# Patient Record
Sex: Male | Born: 1940 | Race: White | Hispanic: No | Marital: Married | Smoking: Former smoker
Health system: Southern US, Community
[De-identification: ages and names within clinical notes are randomized; demographics above are authoritative.]

## PROBLEM LIST (undated history)

## (undated) DIAGNOSIS — Z86018 Personal history of other benign neoplasm: Secondary | ICD-10-CM

## (undated) DIAGNOSIS — L570 Actinic keratosis: Secondary | ICD-10-CM

## (undated) DIAGNOSIS — R000 Tachycardia, unspecified: Secondary | ICD-10-CM

## (undated) DIAGNOSIS — I472 Ventricular tachycardia: Secondary | ICD-10-CM

## (undated) DIAGNOSIS — E119 Type 2 diabetes mellitus without complications: Secondary | ICD-10-CM

## (undated) DIAGNOSIS — R55 Syncope and collapse: Secondary | ICD-10-CM

## (undated) DIAGNOSIS — I4819 Other persistent atrial fibrillation: Secondary | ICD-10-CM

## (undated) DIAGNOSIS — R001 Bradycardia, unspecified: Secondary | ICD-10-CM

## (undated) DIAGNOSIS — I499 Cardiac arrhythmia, unspecified: Secondary | ICD-10-CM

## (undated) DIAGNOSIS — Z950 Presence of cardiac pacemaker: Secondary | ICD-10-CM

## (undated) HISTORY — DX: Bradycardia, unspecified: R00.1

## (undated) HISTORY — DX: Type 2 diabetes mellitus without complications: E11.9

## (undated) HISTORY — DX: Other persistent atrial fibrillation: I48.19

## (undated) HISTORY — DX: Ventricular tachycardia: I47.2

## (undated) HISTORY — DX: Actinic keratosis: L57.0

## (undated) HISTORY — DX: Tachycardia, unspecified: R00.0

## (undated) HISTORY — PX: FINGER SURGERY: SHX640

---
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
## 2005-07-09 ENCOUNTER — Ambulatory Visit: Payer: Self-pay | Admitting: Unknown Physician Specialty

## 2008-10-17 DIAGNOSIS — Z86018 Personal history of other benign neoplasm: Secondary | ICD-10-CM

## 2008-10-17 HISTORY — DX: Personal history of other benign neoplasm: Z86.018

## 2010-11-14 ENCOUNTER — Inpatient Hospital Stay: Payer: Self-pay | Admitting: Internal Medicine

## 2010-12-24 ENCOUNTER — Ambulatory Visit: Payer: Self-pay | Admitting: Cardiology

## 2011-12-30 IMAGING — CR DG CHEST 1V PORT
1 series · 1 of 1 positions shown · non-contrast
Comparison: none

REASON FOR EXAM: chest pain
COMMENTS:

PROCEDURE:     DXR - DXR PORTABLE CHEST SINGLE VIEW  - November 14, 2010  [DATE]
RESULT:     No acute abnormality.

[view not recorded]
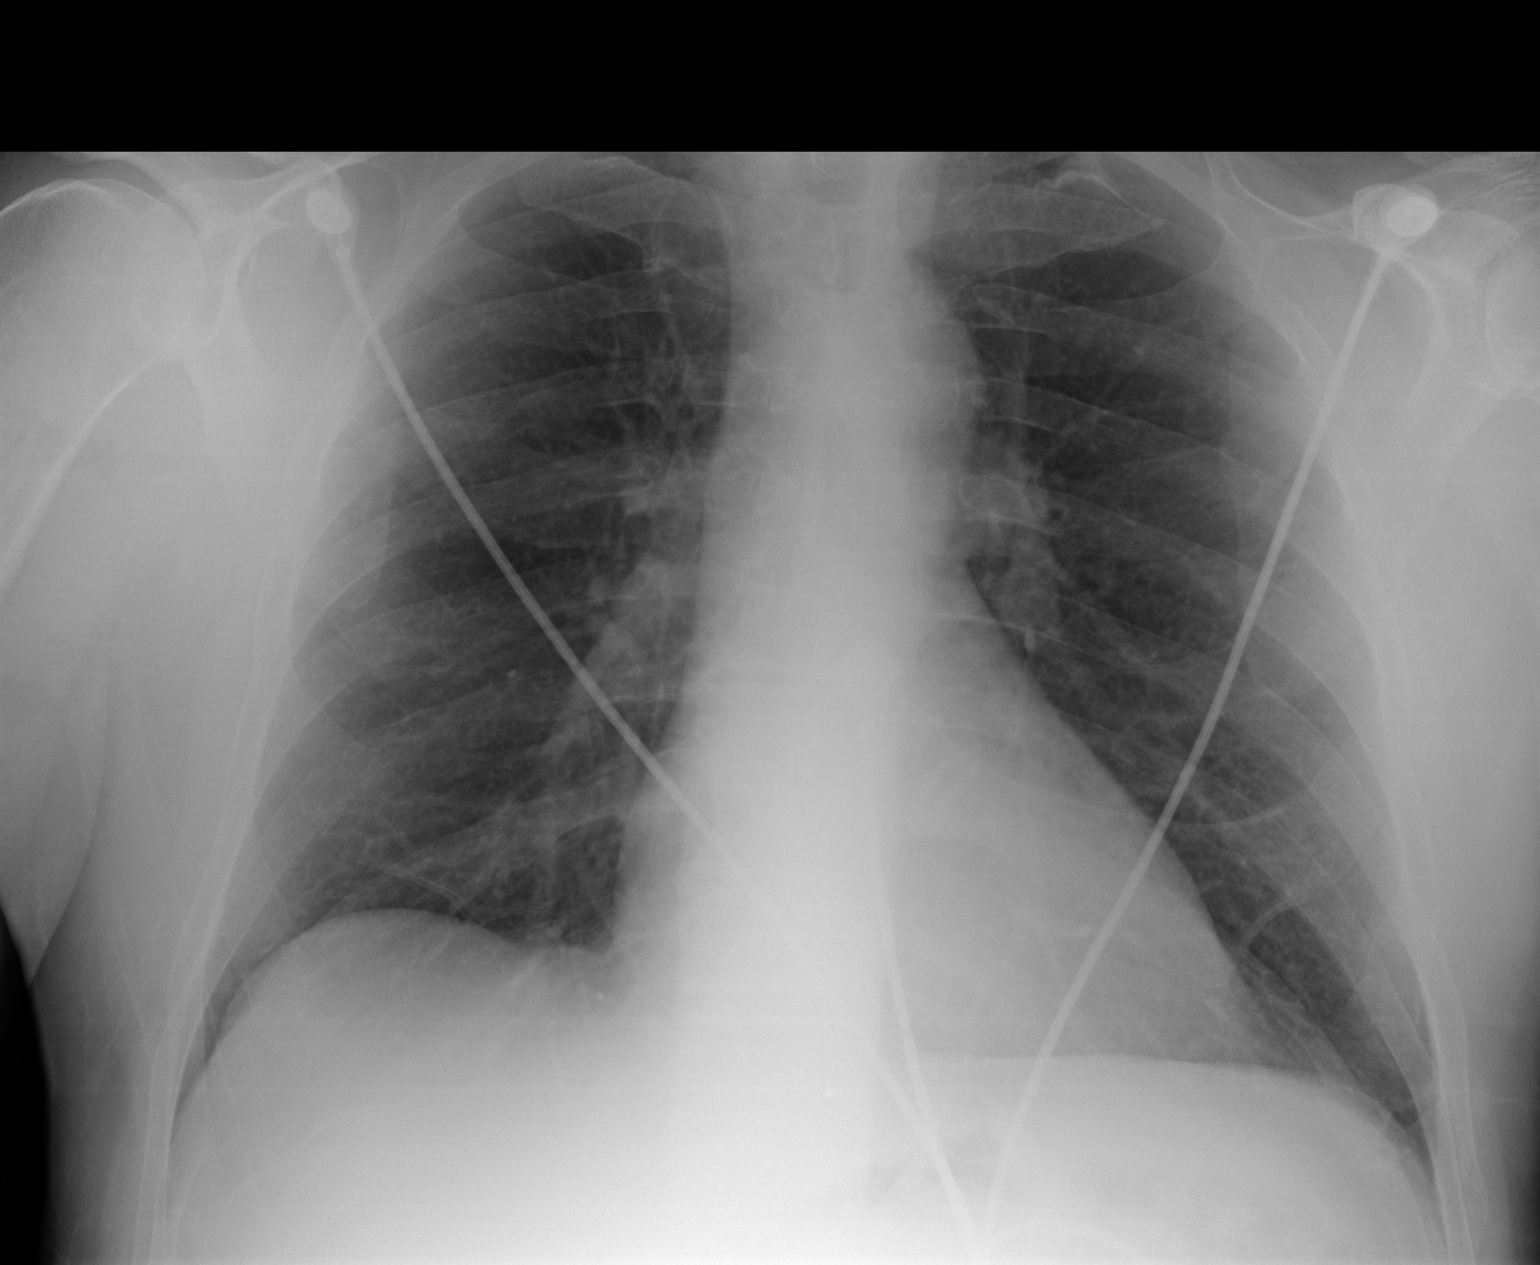

[1 of 1 positions shown; findings below may reference images not displayed]

IMPRESSION: No acute abnormality.

## 2013-09-05 ENCOUNTER — Other Ambulatory Visit: Payer: Self-pay

## 2014-01-04 ENCOUNTER — Encounter (HOSPITAL_COMMUNITY): Payer: Self-pay | Admitting: Emergency Medicine

## 2014-01-04 ENCOUNTER — Inpatient Hospital Stay (HOSPITAL_COMMUNITY)
Admission: EM | Admit: 2014-01-04 | Discharge: 2014-01-09 | DRG: 251 | Disposition: A | Discharge status: Home / Self Care | Payer: Medicare Other | Attending: Cardiology | Admitting: Cardiology

## 2014-01-04 ENCOUNTER — Emergency Department (HOSPITAL_COMMUNITY): Payer: Medicare Other

## 2014-01-04 DIAGNOSIS — I5022 Chronic systolic (congestive) heart failure: Secondary | ICD-10-CM | POA: Diagnosis present

## 2014-01-04 DIAGNOSIS — N179 Acute kidney failure, unspecified: Secondary | ICD-10-CM | POA: Diagnosis present

## 2014-01-04 DIAGNOSIS — I509 Heart failure, unspecified: Secondary | ICD-10-CM | POA: Diagnosis present

## 2014-01-04 DIAGNOSIS — I472 Ventricular tachycardia, unspecified: Secondary | ICD-10-CM | POA: Diagnosis present

## 2014-01-04 DIAGNOSIS — I4729 Other ventricular tachycardia: Secondary | ICD-10-CM | POA: Diagnosis present

## 2014-01-04 DIAGNOSIS — I4891 Unspecified atrial fibrillation: Principal | ICD-10-CM | POA: Diagnosis present

## 2014-01-04 DIAGNOSIS — Z79899 Other long term (current) drug therapy: Secondary | ICD-10-CM

## 2014-01-04 DIAGNOSIS — I428 Other cardiomyopathies: Secondary | ICD-10-CM | POA: Diagnosis present

## 2014-01-04 DIAGNOSIS — Z87891 Personal history of nicotine dependence: Secondary | ICD-10-CM

## 2014-01-04 DIAGNOSIS — Z7982 Long term (current) use of aspirin: Secondary | ICD-10-CM

## 2014-01-04 DIAGNOSIS — I519 Heart disease, unspecified: Secondary | ICD-10-CM

## 2014-01-04 DIAGNOSIS — R000 Tachycardia, unspecified: Secondary | ICD-10-CM

## 2014-01-04 HISTORY — DX: Syncope and collapse: R55

## 2014-01-04 LAB — CBC WITH DIFFERENTIAL/PLATELET
BASOS PCT: 0 % (ref 0–1)
Basophils Absolute: 0 10*3/uL (ref 0.0–0.1)
EOS ABS: 0.1 10*3/uL (ref 0.0–0.7)
Eosinophils Relative: 1 % (ref 0–5)
HEMATOCRIT: 41.3 % (ref 39.0–52.0)
HEMOGLOBIN: 14.4 g/dL (ref 13.0–17.0)
LYMPHS ABS: 1.5 10*3/uL (ref 0.7–4.0)
Lymphocytes Relative: 12 % (ref 12–46)
MCH: 33.6 pg (ref 26.0–34.0)
MCHC: 34.9 g/dL (ref 30.0–36.0)
MCV: 96.5 fL (ref 78.0–100.0)
MONO ABS: 0.7 10*3/uL (ref 0.1–1.0)
Monocytes Relative: 5 % (ref 3–12)
Neutro Abs: 10.5 10*3/uL — ABNORMAL HIGH (ref 1.7–7.7)
Neutrophils Relative %: 82 % — ABNORMAL HIGH (ref 43–77)
Platelets: 202 10*3/uL (ref 150–400)
RBC: 4.28 MIL/uL (ref 4.22–5.81)
RDW: 12.7 % (ref 11.5–15.5)
WBC: 12.9 10*3/uL — ABNORMAL HIGH (ref 4.0–10.5)

## 2014-01-04 LAB — MAGNESIUM: MAGNESIUM: 2.1 mg/dL (ref 1.5–2.5)

## 2014-01-04 LAB — BASIC METABOLIC PANEL
BUN: 29 mg/dL — ABNORMAL HIGH (ref 6–23)
CHLORIDE: 102 meq/L (ref 96–112)
CO2: 22 mEq/L (ref 19–32)
CREATININE: 1.5 mg/dL — AB (ref 0.50–1.35)
Calcium: 8.5 mg/dL (ref 8.4–10.5)
GFR calc non Af Amer: 44 mL/min — ABNORMAL LOW (ref >90)
GFR, EST AFRICAN AMERICAN: 52 mL/min — AB (ref >90)
GLUCOSE: 198 mg/dL — AB (ref 70–99)
Potassium: 4.6 mEq/L (ref 3.7–5.3)
Sodium: 139 mEq/L (ref 137–147)

## 2014-01-04 LAB — POCT I-STAT TROPONIN I: Troponin i, poc: 0.02 ng/mL (ref 0.00–0.08)

## 2014-01-04 LAB — PRO B NATRIURETIC PEPTIDE: Pro B Natriuretic peptide (BNP): 1027 pg/mL — ABNORMAL HIGH (ref 0–125)

## 2014-01-04 MED ORDER — ASPIRIN 81 MG PO CHEW
162.0000 mg | CHEWABLE_TABLET | Freq: Once | ORAL | Status: DC
  Filled 2014-01-04: qty 2

## 2014-01-04 MED ORDER — ASPIRIN EC 81 MG PO TBEC
81.0000 mg | DELAYED_RELEASE_TABLET | Freq: Every day | ORAL | Status: DC
Start: 2014-01-05 — End: 2014-01-09
  Administered 2014-01-05 – 2014-01-09 (×5): 81 mg via ORAL
  Filled 2014-01-04 (×5): qty 1

## 2014-01-04 MED ORDER — SODIUM CHLORIDE 0.9 % IV BOLUS (SEPSIS)
1000.0000 mL | Freq: Once | INTRAVENOUS | Status: AC
  Administered 2014-01-04: 1000 mL via INTRAVENOUS

## 2014-01-04 MED ORDER — HEPARIN SODIUM (PORCINE) 5000 UNIT/ML IJ SOLN
5000.0000 [IU] | Freq: Three times a day (TID) | INTRAMUSCULAR | Status: DC
  Administered 2014-01-05 – 2014-01-08 (×11): 5000 [IU] via SUBCUTANEOUS
  Filled 2014-01-04 (×16): qty 1

## 2014-01-04 MED ORDER — SODIUM CHLORIDE 0.9 % IJ SOLN
3.0000 mL | Freq: Two times a day (BID) | INTRAMUSCULAR | Status: DC
  Administered 2014-01-05 – 2014-01-08 (×6): 3 mL via INTRAVENOUS
  Administered 2014-01-08: 22:00:00 via INTRAVENOUS
  Administered 2014-01-09: 3 mL via INTRAVENOUS

## 2014-01-04 MED ORDER — TIMOLOL MALEATE 0.5 % OP SOLN
1.0000 [drp] | Freq: Every day | OPHTHALMIC | Status: DC
  Administered 2014-01-05 – 2014-01-08 (×4): 1 [drp] via OPHTHALMIC
  Filled 2014-01-04: qty 5

## 2014-01-04 MED ORDER — SODIUM CHLORIDE 0.9 % IV BOLUS (SEPSIS)
500.0000 mL | Freq: Once | INTRAVENOUS | Status: AC
  Administered 2014-01-04: 500 mL via INTRAVENOUS

## 2014-01-04 MED ORDER — SODIUM CHLORIDE 0.9 % IV SOLN
INTRAVENOUS | Status: AC
Start: 2014-01-05 — End: 2014-01-05
  Administered 2014-01-04: 23:00:00 via INTRAVENOUS

## 2014-01-04 NOTE — ED Provider Notes (Signed)
Date: 01/04/2014  Rate: 77  Rhythm: normal sinus rhythm  QRS Axis: left  Intervals: normal  ST/T Wave abnormalities: nonspecific ST changes  Conduction Disutrbances:right bundle branch block  Narrative Interpretation:   Old EKG Reviewed: none available    Sharyon Cable, MD 01/04/14 2030

## 2014-01-04 NOTE — ED Notes (Signed)
Dr Christy Gentles at bedside

## 2014-01-04 NOTE — ED Provider Notes (Signed)
CSN: [id number]     Arrival date & time 01/04/14  1925 History   First MD Initiated Contact with Patient 01/04/14 1934     No chief complaint on file.  HPI Comments: 73 yo M hx of paroxysmal afib, smoking hx, presents via EMS w/ tachycardia s/p cardioversion.  Pt states was bending down to tent to his home garden around 6 PM when he had sudden onset chest tightness (central, nonradiating), palpitations, nausea, diaphoresis, dyspnea.  Pt rested for approximately 15 minutes, with no change in symptoms.  EMS called.  Pt was found to have wide complex tachycardia rate 228, unable to record BP.  Pt received 2 ASA from wife, and EMS gave 150 mg amiodarone, adenosine 6 mg, followed 12 mg, followed by 12 mg, with no change.  Pt started become diaphoretic and nauseated, unable to record BP.  Pt was synchronize cardioverted 100 J, with interval return of sinus rhythm, HR 76, BP 121/44.  On arrival pt states he is asymptomatic.  Family state pt has been increasingly SOB at home for the last 6 months.  He has also had three syncopal episodes in the last three months, last one was 3 weeks ago.  Pt's cardiologist is Dr. Saralyn Pilar in [place], [place].  Pt denies requiring cardioversion in past.  He denies hx of CAD.  He does have hx of paroxysmal afib.  He is currently on Flecainide 50 mg PO BID, and Metoprolol 25 mg PO BID.  No recent changes in medications.   The history is provided by the patient and the EMS personnel. No language interpreter was used.    No past medical history on file. No past surgical history on file. No family history on file. History  Substance Use Topics  . Smoking status: Not on file  . Smokeless tobacco: Not on file  . Alcohol Use: Not on file    Review of Systems  Constitutional: Positive for diaphoresis. Negative for fever and chills.  Respiratory: Positive for chest tightness and shortness of breath. Negative for cough, wheezing and stridor.   Cardiovascular: Positive for chest  pain and palpitations. Negative for leg swelling.  Gastrointestinal: Positive for nausea. Negative for vomiting, abdominal pain and diarrhea.  Musculoskeletal: Negative for myalgias.  Skin: Negative for rash.  Neurological: Negative for dizziness, weakness, light-headedness, numbness and headaches.  Hematological: Negative for adenopathy. Does not bruise/bleed easily.  All other systems reviewed and are negative.      Allergies  Review of patient's allergies indicates not on file.  Home Medications  No current outpatient prescriptions on file. There were no vitals taken for this visit. Physical Exam  Nursing note and vitals reviewed. Constitutional: He is oriented to person, place, and time. He appears well-developed and well-nourished.  HENT:  Head: Normocephalic and atraumatic.  Right Ear: External ear normal.  Left Ear: External ear normal.  Nose: Nose normal.  Mouth/Throat: Oropharynx is clear and moist.  Eyes: Conjunctivae and EOM are normal. Pupils are equal, round, and reactive to light.  Neck: Normal range of motion. Neck supple.  Cardiovascular: Normal rate, regular rhythm, normal heart sounds and intact distal pulses.   NSR, rate 70's.  Trace pitting edema bilaterally.  Pulmonary/Chest: Effort normal and breath sounds normal. No respiratory distress. He has no wheezes. He has no rales. He exhibits no tenderness.  Abdominal: Soft. Bowel sounds are normal. He exhibits no distension and no mass. There is no tenderness. There is no rebound and no guarding.  Musculoskeletal: Normal range  of motion.  Neurological: He is alert and oriented to person, place, and time.  Skin: Skin is warm and dry.    ED Course  Procedures (including critical care time) Labs Review Labs Reviewed  PRO B NATRIURETIC PEPTIDE - Abnormal; Notable for the following:    Pro B Natriuretic peptide (BNP) 1027.0 (*)    All other components within normal limits  CBC WITH DIFFERENTIAL - Abnormal;  Notable for the following:    WBC 12.9 (*)    Neutrophils Relative % 82 (*)    Neutro Abs 10.5 (*)    All other components within normal limits  BASIC METABOLIC PANEL - Abnormal; Notable for the following:    Glucose, Bld 198 (*)    BUN 29 (*)    Creatinine, Ser 1.50 (*)    GFR calc non Af Amer 44 (*)    GFR calc Af Amer 52 (*)    All other components within normal limits  MRSA PCR SCREENING  MAGNESIUM  BASIC METABOLIC PANEL  CBC  PROTIME-INR  POCT I-STAT TROPONIN I   Imaging Review Dg Chest Portable 1 View  01/04/2014   CLINICAL DATA:  Atrial fibrillation.  EXAM: PORTABLE CHEST - 1 VIEW  COMPARISON:  None.  FINDINGS: There is cardiomegaly without edema. The lungs are clear. No pneumothorax or pleural effusion.  IMPRESSION: Cardiomegaly without acute disease.   Electronically Signed   By: Inge Rise M.D.   On: 01/04/2014 20:05    EKG Interpretation   None       MDM   Final diagnoses:  None   74 yo M hx of paroxysmal afib, smoking hx, presents via EMS w/ tachycardia s/p cardioversion.   Filed Vitals:   01/04/14 2212  BP: 82/60  Pulse:   Temp:   Resp: 18   Physical exam as above.  On pt arrival, he is asymptomatic.  Pt placed on cardiac monitor, Zoll pads connected.  Pt with some hypotension to 80's, and started on IVFs for this, with some initial improvement.  Rhythm strips from EMS show wide complex tachycardia concerning for VT vs SVT w/ abberancy.  EKG here s/p cardioversion demonstrates NSR, RBBB, LAFB, LVH, HR 77, PR 197, QRS 140, QT/QTc 417/472.  CXR demonstrated cardiomegaly without acute disease.  CBC with mild leukocytosis 12.5.  BMP with elevated BUN/Cr @ 29/1.5.  Mg 2.1.  Troponin 0.02.  BNP slightly elevated 1027.    Cardiology consulted on ED arrival.  Pt to be admitted to their service for further evaluation and management.  Pt understands and agrees with plan.  Pt's care plan discussed with Dr. Christy Gentles.  Sinda Du, MD      Sinda Du,  MD 01/05/14 [phone]

## 2014-01-04 NOTE — ED Notes (Signed)
Pts family at bedside

## 2014-01-04 NOTE — ED Provider Notes (Signed)
Patient seen/examined in the Emergency Department in conjunction with Resident Physician Provider Justin Mend Patient reports weakness and SOB at home, noted to have [facility] en route and was cardioverted en route.  He now feels well Exam : awake/alert, maex4 Plan: prehospital EKG reveals wide complex tachycardia, now in sinus rhythm Will consult cardiology    Sharyon Cable, MD 01/04/14 2026

## 2014-01-04 NOTE — H&P (Signed)
Cardiology History and Physical  No PCP Per Patient  History of Present Illness (and review of medical records): Grant Moreno. is a 73 y.o. male who presents for evaluation of palpitations.  He is followed by Dr. Saralyn Pilar at [facility] for Atrial fibrillation.  He has no hx of MI or known CAD.  He was admitted in 10/2010 for Afib.  He was placed on Cardizem and converted to sinus rhythm.  TTE at that time revealed LV EF 45%.  He was placed on ASA as CHADS was 0.  He had Sestamibi in follow up on 1/23/12which revealed mild reduce LV function and mild inferior scar without ischemia.  LHC on 12/24/10 revealed EF 56% with normal coronary anatomy.  He has done well on metoprolol and flecainide.    Over the last 6 months he has had several presyncopal and syncopal episodes.  These episodes are similar in nature where he experiences palpitations, dizziness, and abdominal discomfort.  His wife reports at least 3 episodes when he lost consciousness for brief period.  He has not seek attention recently.  Today around 6pm after dinner, he experienced worse episode, also associated with diaphoresis.  EMS was called.  He was found to be in a WCT HR in 200s.  This was unresponsive to Amiodarone and Adenosine 45m x 2.  He became more symptomatic and required DCCV to sinus rhythm.  He was brought to ED for further evaluation.  He currently remains in sinus rhythm and asymptomatic.   Previous diagnostic testing for coronary artery disease includes: cardiac catheterization, echocardiogram and Sestamibi. Previous history of cardiac disease includes Atrial Fibrillation. Coronary artery disease risk factors include: advanced age (older than 554for men, 644for women), male gender, obesity (BMI >= 30 kg/m2) and smoking/ tobacco exposure. Patient denies history of coronary artery disease, ischemic heart disease and previous M.I..  Review of Systems Pertinent items are noted in HPI. Further review of systems was otherwise negative  other than stated in HPI.  Patient Active Problem List   Diagnosis Date Noted  . Wide-complex tachycardia 01/04/2014   Past Medical History  Diagnosis Date  . A-fib   . Syncope   . Bronchitis, chronic     Wife reports that patient gets bronchitis multiple times a year.  . Flu     Documented Flu January 2015 - treated with Tamiflu    Past Surgical History  Procedure Laterality Date  . Finger surgery      Noted the only time patient has been admitted overnight.      Prescriptions prior to admission  Medication Sig Dispense Refill  . aspirin EC 81 MG tablet Take 81 mg by mouth daily.      . cholecalciferol (VITAMIN D) 1000 UNITS tablet Take 1,000 Units by mouth daily.      . flecainide (TAMBOCOR) 50 MG tablet Take 50 mg by mouth 2 (two) times daily.      . metoprolol tartrate (LOPRESSOR) 25 MG tablet Take 12.5 mg by mouth 2 (two) times daily.      . timolol (TIMOPTIC) 0.5 % ophthalmic solution Place 1 drop into the right eye at bedtime.      . vitamin B-12 (CYANOCOBALAMIN) 1000 MCG tablet Take 1,000 mcg by mouth daily.       No Known Allergies  History  Substance Use Topics  . Smoking status: Former Smoker -- 3.00 packs/day    Types: Cigarettes  . Smokeless tobacco: Former [employer]   Types: Chew  .  Alcohol Use: No    History reviewed. No pertinent family history.   Objective:  Patient Vitals for the past 8 hrs:  BP Temp Temp src Pulse Resp SpO2 Height Weight  01/05/14 0406 89/54 mmHg 97.7 F (36.5 C) Oral 67 16 98 % - -  01/05/14 0100 74/42 mmHg - - 67 18 100 % - -  01/05/14 0033 - 97.3 F (36.3 C) Oral - - - - -  01/05/14 0000 74/53 mmHg - - 58 17 96 % - -  01/04/14 2300 83/51 mmHg - - 63 16 98 % - -  01/04/14 2245 101/46 mmHg - - 67 16 97 % _0  (1.93 m) 101.606 kg (224 lb)  01/04/14 2212 82/60 mmHg - - - 18 93 % - -  01/04/14 2150 85/52 mmHg - - - - - - -  01/04/14 2135 81/54 mmHg - - 68 25 98 % - -   General appearance: alert, cooperative, appears stated age and  no distress Head: Normocephalic, without obvious abnormality, atraumatic Eyes: conjunctivae/corneas clear. PERRL, EOM's intact. Fundi benign. Neck: no carotid bruit, no JVD and supple, symmetrical, trachea midline Lungs: clear to auscultation bilaterally Chest wall: no tenderness Heart: regular rate and rhythm, S1, S2 normal, no murmur Abdomen: soft, non-tender; bowel sounds normal; no masses,  no organomegaly Extremities: extremities normal, atraumatic, no cyanosis or edema Pulses: 2+ and symmetric Neurologic: Grossly normal  Results for orders placed during the hospital encounter of 01/04/14 (from the past 48 hour(s))  PRO B NATRIURETIC PEPTIDE     Status: Abnormal   Collection Time    01/04/14  7:48 PM      Result Value Ref Range   Pro B Natriuretic peptide (BNP) 1027.0 (*) 0 - 125 pg/mL  CBC WITH DIFFERENTIAL     Status: Abnormal   Collection Time    01/04/14  7:48 PM      Result Value Ref Range   WBC 12.9 (*) 4.0 - 10.5 K/uL   RBC 4.28  4.22 - 5.81 MIL/uL   Hemoglobin 14.4  13.0 - 17.0 g/dL   HCT 41.3  39.0 - 52.0 %   MCV 96.5  78.0 - 100.0 fL   MCH 33.6  26.0 - 34.0 pg   MCHC 34.9  30.0 - 36.0 g/dL   RDW 12.7  11.5 - 15.5 %   Platelets 202  150 - 400 K/uL   Neutrophils Relative % 82 (*) 43 - 77 %   Neutro Abs 10.5 (*) 1.7 - 7.7 K/uL   Lymphocytes Relative 12  12 - 46 %   Lymphs Abs 1.5  0.7 - 4.0 K/uL   Monocytes Relative 5  3 - 12 %   Monocytes Absolute 0.7  0.1 - 1.0 K/uL   Eosinophils Relative 1  0 - 5 %   Eosinophils Absolute 0.1  0.0 - 0.7 K/uL   Basophils Relative 0  0 - 1 %   Basophils Absolute 0.0  0.0 - 0.1 K/uL  BASIC METABOLIC PANEL     Status: Abnormal   Collection Time    01/04/14  7:48 PM      Result Value Ref Range   Sodium 139  137 - 147 mEq/L   Potassium 4.6  3.7 - 5.3 mEq/L   Chloride 102  96 - 112 mEq/L   CO2 22  19 - 32 mEq/L   Glucose, Bld 198 (*) 70 - 99 mg/dL   BUN 29 (*) 6 - 23 mg/dL  Creatinine, Ser 1.50 (*) 0.50 - 1.35 mg/dL    Calcium 8.5  8.4 - 10.5 mg/dL   GFR calc non Af Amer 44 (*) >90 mL/min   GFR calc Af Amer 52 (*) >90 mL/min   Comment: (NOTE)     The eGFR has been calculated using the CKD EPI equation.     This calculation has not been validated in all clinical situations.     eGFR's persistently <90 mL/min signify possible Chronic Kidney     Disease.  MAGNESIUM     Status: None   Collection Time    01/04/14  7:48 PM      Result Value Ref Range   Magnesium 2.1  1.5 - 2.5 mg/dL  POCT I-STAT TROPONIN I     Status: None   Collection Time    01/04/14  7:57 PM      Result Value Ref Range   Troponin i, poc 0.02  0.00 - 0.08 ng/mL   Comment 3            Comment: Due to the release kinetics of cTnI,     a negative result within the first hours     of the onset of symptoms does not rule out     myocardial infarction with certainty.     If myocardial infarction is still suspected,     repeat the test at appropriate intervals.  MRSA PCR SCREENING     Status: None   Collection Time    01/04/14 10:57 PM      Result Value Ref Range   MRSA by PCR NEGATIVE  NEGATIVE   Comment:            The GeneXpert MRSA Assay (FDA     approved for NASAL specimens     only), is one component of a     comprehensive MRSA colonization     surveillance program. It is not     intended to diagnose MRSA     infection nor to guide or     monitor treatment for     MRSA infections.  BASIC METABOLIC PANEL     Status: Abnormal   Collection Time    01/05/14  2:53 AM      Result Value Ref Range   Sodium 141  137 - 147 mEq/L   Potassium 4.8  3.7 - 5.3 mEq/L   Chloride 109  96 - 112 mEq/L   CO2 22  19 - 32 mEq/L   Glucose, Bld 96  70 - 99 mg/dL   BUN 28 (*) 6 - 23 mg/dL   Creatinine, Ser 1.20  0.50 - 1.35 mg/dL   Calcium 7.8 (*) 8.4 - 10.5 mg/dL   GFR calc non Af Amer 58 (*) >90 mL/min   GFR calc Af Amer 67 (*) >90 mL/min   Comment: (NOTE)     The eGFR has been calculated using the CKD EPI equation.     This calculation  has not been validated in all clinical situations.     eGFR's persistently <90 mL/min signify possible Chronic Kidney     Disease.  CBC     Status: Abnormal   Collection Time    01/05/14  2:53 AM      Result Value Ref Range   WBC 8.8  4.0 - 10.5 K/uL   RBC 3.98 (*) 4.22 - 5.81 MIL/uL   Hemoglobin 13.2  13.0 - 17.0 g/dL   HCT 38.5 (*) 39.0 - 52.0 %  MCV 96.7  78.0 - 100.0 fL   MCH 33.2  26.0 - 34.0 pg   MCHC 34.3  30.0 - 36.0 g/dL   RDW 12.8  11.5 - 15.5 %   Platelets 181  150 - 400 K/uL  PROTIME-INR     Status: Abnormal   Collection Time    01/05/14  2:53 AM      Result Value Ref Range   Prothrombin Time 15.9 (*) 11.6 - 15.2 seconds   INR 1.30  0.00 - 1.49   Dg Chest Portable 1 View  01/04/2014   CLINICAL DATA:  Atrial fibrillation.  EXAM: PORTABLE CHEST - 1 VIEW  COMPARISON:  None.  FINDINGS: There is cardiomegaly without edema. The lungs are clear. No pneumothorax or pleural effusion.  IMPRESSION: Cardiomegaly without acute disease.   Electronically Signed   By: Inge Rise M.D.   On: 01/04/2014 20:05    ECG:  Rhythm strip reveals Wide complex tachycardia HR 228.  EKG reveals sinus rhythm HR 77 RBBB, LAFB, which is similar to prior ecgs at [facility].  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 Has had recent evaluation for ischemic disease with normal cath in 2012 at [facility]. -Hx of PAF on Flecainide and Metoprolol -AKI  Plan: 1. Cardiology Admission  2. Continuous monitoring on Telemetry. 3. Repeat ekg on admit, prn chest pain or arrythmia 4. Continue BB, as BP will tolerate. 5. EP evaluation in am 6. Gentle IVFs, monitor electrolytes

## 2014-01-04 NOTE — ED Notes (Signed)
Dr Justin Mend in room

## 2014-01-04 NOTE — ED Notes (Signed)
Pt states that his BP has been running low since he was started on metoprolol. States that his MD has cut his dosage in half.

## 2014-01-04 NOTE — ED Notes (Addendum)
Pt arrives via EMS. Pts family called EMS after pt ambulating to mailbox and experienced shortness of breath and stomach fullness. When EMS arrives pt appeared afibv with PVC's on the monitor rate 220. No BP detectable. Pt AAOx3. EMS administered 15 amiodarone, Adenosine 6, 12 ,12. Pt became nauseous and SOB and EMS administered Versed 2.5. Pt then cardioverted @ 100 and became SR w/ ocasional PVC's rate 72. BP 121/44. Pt states she had a similar episode of this 3 years ago. States that he does experience syncope but has not contacted a PCP about these symptoms. Wife gave pt 2 350's aspirins before EMS arrives.

## 2014-01-04 NOTE — ED Notes (Signed)
Dr. Webb at bedside

## 2014-01-04 NOTE — ED Notes (Signed)
Dr Waunita Schooner in room

## 2014-01-05 ENCOUNTER — Encounter (HOSPITAL_COMMUNITY): Payer: Self-pay | Admitting: Surgery

## 2014-01-05 DIAGNOSIS — I519 Heart disease, unspecified: Secondary | ICD-10-CM

## 2014-01-05 DIAGNOSIS — I4891 Unspecified atrial fibrillation: Principal | ICD-10-CM

## 2014-01-05 DIAGNOSIS — I369 Nonrheumatic tricuspid valve disorder, unspecified: Secondary | ICD-10-CM

## 2014-01-05 DIAGNOSIS — I4729 Other ventricular tachycardia: Secondary | ICD-10-CM

## 2014-01-05 DIAGNOSIS — I472 Ventricular tachycardia: Secondary | ICD-10-CM

## 2014-01-05 LAB — BASIC METABOLIC PANEL
BUN: 28 mg/dL — AB (ref 6–23)
CHLORIDE: 109 meq/L (ref 96–112)
CO2: 22 mEq/L (ref 19–32)
CREATININE: 1.2 mg/dL (ref 0.50–1.35)
Calcium: 7.8 mg/dL — ABNORMAL LOW (ref 8.4–10.5)
GFR calc Af Amer: 67 mL/min — ABNORMAL LOW (ref >90)
GFR calc non Af Amer: 58 mL/min — ABNORMAL LOW (ref >90)
Glucose, Bld: 96 mg/dL (ref 70–99)
Potassium: 4.8 mEq/L (ref 3.7–5.3)
Sodium: 141 mEq/L (ref 137–147)

## 2014-01-05 LAB — CBC
HEMATOCRIT: 38.5 % — AB (ref 39.0–52.0)
Hemoglobin: 13.2 g/dL (ref 13.0–17.0)
MCH: 33.2 pg (ref 26.0–34.0)
MCHC: 34.3 g/dL (ref 30.0–36.0)
MCV: 96.7 fL (ref 78.0–100.0)
Platelets: 181 10*3/uL (ref 150–400)
RBC: 3.98 MIL/uL — ABNORMAL LOW (ref 4.22–5.81)
RDW: 12.8 % (ref 11.5–15.5)
WBC: 8.8 10*3/uL (ref 4.0–10.5)

## 2014-01-05 LAB — PROTIME-INR
INR: 1.3 (ref 0.00–1.49)
Prothrombin Time: 15.9 seconds — ABNORMAL HIGH (ref 11.6–15.2)

## 2014-01-05 LAB — GLUCOSE, CAPILLARY: GLUCOSE-CAPILLARY: 101 mg/dL — AB (ref 70–99)

## 2014-01-05 LAB — MRSA PCR SCREENING: MRSA by PCR: NEGATIVE

## 2014-01-05 MED ORDER — CARVEDILOL 3.125 MG PO TABS
3.1250 mg | ORAL_TABLET | Freq: Two times a day (BID) | ORAL | Status: DC
  Administered 2014-01-06 – 2014-01-09 (×8): 3.125 mg via ORAL
  Filled 2014-01-05 (×11): qty 1

## 2014-01-05 MED ORDER — SODIUM CHLORIDE 0.9 % IV BOLUS (SEPSIS)
500.0000 mL | Freq: Once | INTRAVENOUS | Status: AC
  Administered 2014-01-05: 500 mL via INTRAVENOUS

## 2014-01-05 MED ORDER — LISINOPRIL 5 MG PO TABS
5.0000 mg | ORAL_TABLET | Freq: Every day | ORAL | Status: DC
  Administered 2014-01-06 – 2014-01-09 (×4): 5 mg via ORAL
  Filled 2014-01-05 (×4): qty 1

## 2014-01-05 NOTE — Consult Note (Addendum)
ELECTROPHYSIOLOGY CONSULT NOTE   Patient ID: Grant Moreno. MRN: [id number], DOB/AGE: 03/08/1941   Admit date: 01/04/2014 Date of Consult: 01/05/2014  Primary Physician: None per patient Primary Cardiologist: Saralyn Pilar, MD in [place], [place] Reason for Consultation: Syncope and WCT  History of Present Illness Grant Moreno. is a 73 y.o. male with paroxysmal atrial fibrillation and atrial flutter diagnosed 2011, chronic LBBB, normal coronary arteries  LVEF (45%) s/p cardiac cath Feb 2012 who was admitted last night with near syncope. He has been followed by Dr. Saralyn Moreno for atrial fibrillation and is currently taking flecainide and metoprolol. Over the last 3 months Grant Moreno reports intermittent dizziness / near syncope and 3 episodes of syncope. All of the near syncopal episodes are similar in that he has sudden onset of racing palpitations accompanied by dizziness and abdominal fullness. His syncopal episodes have been ongoing for years and seem to be more related to postural changes and Mrs. Rote states his BP always runs low. Yesterday around 6 pm after eating dinner he experienced his worst episode which was more severe than previous and was accompanied by profound diaphoresis. EMS was activated and on their arrival, he was found to be in a WCT with rate of 228 bpm. Per EMS report, he was given amiodarone and adenosine without improvement or change in rhythm. He became more symptomatic and was cardioverted in the field. On admission he was in [place]. His work-up so far is notable for negative troponin, elevated BNP 1027, normal potassium 4.6, normal magnesium 2.1. Echo and thyroid studies are pending. He reports compliance with his medications.     Past Medical History Past Medical History  Diagnosis Date  . A-fib   . Syncope   . Bronchitis, chronic     Wife reports that patient gets bronchitis multiple times a year.  . Flu     Documented Flu January 2015 - treated with Tamiflu    Past Surgical  History Past Surgical History  Procedure Laterality Date  . Finger surgery      Noted the only time patient has been admitted overnight.      Allergies/Intolerances No Known Allergies  Current Home Medications      aspirin EC 81 MG tablet  Take 81 mg by mouth daily.     cholecalciferol 1000 UNITS tablet  Commonly known as:  VITAMIN D  Take 1,000 Units by mouth daily.     flecainide 50 MG tablet  Commonly known as:  TAMBOCOR  Take 50 mg by mouth 2 (two) times daily.     metoprolol tartrate 25 MG tablet  Commonly known as:  LOPRESSOR  Take 12.5 mg by mouth 2 (two) times daily.     timolol 0.5 % ophthalmic solution  Commonly known as:  TIMOPTIC  Place 1 drop into the right eye at bedtime.     vitamin B-12 1000 MCG tablet  Commonly known as:  CYANOCOBALAMIN  Take 1,000 mcg by mouth daily.     Inpatient Medications . aspirin EC  81 mg Oral Daily  . heparin  5,000 Units Subcutaneous 3 times per day  . sodium chloride  3 mL Intravenous Q12H  . timolol  1 drop Right Eye QHS    Family History Positive for CAD in his father but not prematurely. He ultimately died from lung CA. Negative family history for sudden cardiac death.  Social History History   Social History  . Marital Status: Married    Spouse Name: N/A  Number of Children: N/A  . Years of Education: N/A   Occupational History  . Not on file.   Social History Main Topics  . Smoking status: Former Smoker -- 3.00 packs/day    Types: Cigarettes  . Smokeless tobacco: Former Systems developer    Types: Chew  . Alcohol Use: No  . Drug Use: No  . Sexual Activity: Not on file   Other Topics Concern  . Not on file   Social History Narrative  . No narrative on file     Review of Systems General: No chills, fever, night sweats or weight changes  Cardiovascular:  No chest pain, dyspnea on exertion, edema, orthopnea, palpitations, paroxysmal nocturnal dyspnea Dermatological: No rash, lesions or masses Respiratory:  No cough, dyspnea Urologic: No hematuria, dysuria Abdominal: No nausea, vomiting, diarrhea, bright red blood per rectum, melena, or hematemesis Neurologic: No visual changes, weakness, changes in mental status All other systems reviewed and are otherwise negative except as noted above.  Physical Exam Vitals: Blood pressure 102/66, pulse 57, temperature 97.8 F (36.6 C), temperature source Oral, resp. rate 17, height 6\' 4"  (1.93 m), weight 224 lb (101.606 kg), SpO2 97.00%.  General: Well developed, well appearing 73 y.o. male in no acute distress. HEENT: Normocephalic, atraumatic. EOMs intact. Sclera nonicteric. Oropharynx clear.  Neck: Supple without bruits. No JVD. Lungs: Respirations regular and unlabored, CTA bilaterally. No wheezes, rales or rhonchi. Heart: RRR. S1, S2 present. No murmurs, rub, S3 or S4. Abdomen: Soft, non-tender, non-distended. BS present x 4 quadrants. No hepatosplenomegaly.  Extremities: No clubbing, cyanosis or edema. DP/PT/Radials 2+ and equal bilaterally. Psych: Normal affect. Neuro: Alert and oriented X 3. Moves all extremities spontaneously. Musculoskeletal: No kyphosis. Skin: Intact. Warm and dry. No rashes or petechiae in exposed areas.   Labs Lab Results  Component Value Date   WBC 8.8 01/05/2014   HGB 13.2 01/05/2014   HCT 38.5* 01/05/2014   MCV 96.7 01/05/2014   PLT 181 01/05/2014    Recent Labs Lab 01/05/14 0253  NA 141  K 4.8  CL 109  CO2 22  BUN 28*  CREATININE 1.20  CALCIUM 7.8*  GLUCOSE 96   Magnesium 2.1 Troponin 0.02 ProBNP 1027   Recent Labs  01/05/14 0253  INR 1.30    Radiology/Studies Dg Chest Portable 1 View January 17, 2014    FINDINGS: There is cardiomegaly without edema. The lungs are clear. No pneumothorax or pleural effusion.   IMPRESSION: Cardiomegaly without acute disease.    Electronically Signed   By: Inge Rise M.D.   On: 01/17/2014 20:05   Echocardiogram  Pending  12-lead ECG from EMS - regular, WCT with  RBBB morphology at 228 bpm 12-lead ECG this AM - NSR at 62 bpm with LBBB, QRS duration 124 msec Telemetry - SR with PACs and PVCs  Assessment and Plan  1. Sustained VT - question if related to flecainide proarrhythmic effects - stop flecainide  - agree with cycling cardiac enzymes to rule out MI - will order an echocardiogram to assess LVEF - will get all records from Dr. Saralyn Moreno  - no driving   2. PAF  - on flecainide prior to admission - recommend discontinuing flecainide and will check level - if LVEF normal, chads2-vasc score is 1; not on anticoagulation currently - continue metoprolol  Signed, EDMISTEN, BROOKE, PA-C 01/05/2014, 11:36 AM  I have seen, examined the patient, and reviewed the above assessment and plan.  Changes to above are made where necessary.  The patient presents with  syncope and documented wide complex tachycardia.  I have reviewed his EMS strips.  Though I cannot exclude aberrant SVT, I think that the tracings are more consistent with VT.  This could be an arrhythmic effect of flecainide, however he does have a significantly depressed EF.  Flecainide level is pending.  I would recommend cath to evaluate his worsening EF.  In addition, I would advise initiation of ace inhibitor and beta blocker therapy.  If he has no lesions amenable to intervention, then I would advise EP study on Monday.  If he continues to have inducible VT after flecainide washout then I think that I would favor ICD implantation at that time. (an alternative would be amiodarone therapy).  Given his age and EF, CHADS2VASC score is at least 2, he will require anticoagulation long term for his afib.  I would defer this until the above workup is complete.  He will also likely need an AAD for management of his afib.  His options would be either tikosyn or amiodarone.  I would defer this at that present time as well.  Keep K >3.9 and Mg>1.9.  Check TFTs.  EP to follow.  Co Sign: Thompson Grayer,  MD 01/05/2014 10:44 PM

## 2014-01-05 NOTE — Progress Notes (Signed)
Utilization Review Completed.  

## 2014-01-05 NOTE — Progress Notes (Signed)
  Echocardiogram 2D Echocardiogram has been performed.  Walton Digilio 01/05/2014, 3:30 PM

## 2014-01-05 NOTE — Progress Notes (Signed)
   TELEMETRY: Reviewed telemetry pt in NSR: Filed Vitals:   01/05/14 0500 01/05/14 0600 01/05/14 0700 01/05/14 0746  BP: 91/60 81/38 102/66 102/66  Pulse: 55 67 63 57  Temp:    97.8 F (36.6 C)  TempSrc:    Oral  Resp: 14 18 16 17   Height:      Weight:      SpO2: 97% 99% 96% 97%    Intake/Output Summary (Last 24 hours) at 01/05/14 1130 Last data filed at 01/05/14 0800  Gross per 24 hour  Intake   3000 ml  Output    700 ml  Net   2300 ml    SUBJECTIVE No complaints today. Denies CP or SOB  LABS: Basic Metabolic Panel:  Recent Labs  01/04/14 1948 01/05/14 0253  NA 139 141  K 4.6 4.8  CL 102 109  CO2 22 22  GLUCOSE 198* 96  BUN 29* 28*  CREATININE 1.50* 1.20  CALCIUM 8.5 7.8*  MG 2.1  --    Liver Function Tests: No results found for this basename: AST, ALT, ALKPHOS, BILITOT, PROT, ALBUMIN,  in the last 72 hours No results found for this basename: LIPASE, AMYLASE,  in the last 72 hours CBC:  Recent Labs  01/04/14 1948 01/05/14 0253  WBC 12.9* 8.8  NEUTROABS 10.5*  --   HGB 14.4 13.2  HCT 41.3 38.5*  MCV 96.5 96.7  PLT 202 181   Cardiac Enzymes: No results found for this basename: CKTOTAL, CKMB, CKMBINDEX, TROPONINI,  in the last 72 hours BNP: 1027   Radiology/Studies:  Dg Chest Portable 1 View  01/04/2014   CLINICAL DATA:  Atrial fibrillation.  EXAM: PORTABLE CHEST - 1 VIEW  COMPARISON:  None.  FINDINGS: There is cardiomegaly without edema. The lungs are clear. No pneumothorax or pleural effusion.  IMPRESSION: Cardiomegaly without acute disease.   Electronically Signed   By: Inge Rise M.D.   On: 01/04/2014 20:05   Ecg: NSR LAD, LBBB  PHYSICAL EXAM General: Well developed, well nourished, in no acute distress. Head: Normocephalic, atraumatic, sclera non-icteric, no xanthomas, nares are without discharge. Neck: Negative for carotid bruits. JVD not elevated. Lungs: Clear bilaterally to auscultation without wheezes, rales, or rhonchi.  Breathing is unlabored. Heart: RRR S1 S2 without murmurs, rubs, or gallops.  Abdomen: Soft, non-tender, non-distended with normoactive bowel sounds. No hepatomegaly. No rebound/guarding. No obvious abdominal masses. Msk:  Strength and tone appears normal for age. Extremities: No clubbing, cyanosis or edema.  Distal pedal pulses are 2+ and equal bilaterally. Neuro: Alert and oriented X 3. Moves all extremities spontaneously. Psych:  Responds to questions appropriately with a normal affect.  ASSESSMENT AND PLAN: 1. Wide complex tachycardia. Rate 200. S/p cardioversion. Doubt this is afib. ? Flutter with 1:1 conduction versus V tach. Will hold flecainide. Check Echo. Will ask EP to see today. Patient reports several episodes of syncope/presyncope over the past 3 months. Also complains of increased dyspnea on exertion.  2. History of atrial fibrillation. 3. AKI- improved.  Active Problems:   Wide-complex tachycardia    Signed, Keona Sheffler Martinique MD,FACC 01/05/2014 11:34 AM

## 2014-01-05 NOTE — Progress Notes (Signed)
Nursing Note: Patient asymptomatic hypotension (right arm 74/51) (left arm 68/43).  Notified paging system to advise MD on call for additional orders or new reference parameters.

## 2014-01-06 ENCOUNTER — Encounter (HOSPITAL_COMMUNITY)
Admission: EM | Disposition: A | Discharge status: Home / Self Care | Payer: Self-pay | Source: Home / Self Care | Attending: Cardiology

## 2014-01-06 HISTORY — PX: LEFT HEART CATHETERIZATION WITH CORONARY ANGIOGRAM: SHX5451

## 2014-01-06 LAB — BASIC METABOLIC PANEL
BUN: 15 mg/dL (ref 6–23)
CO2: 23 mEq/L (ref 19–32)
Calcium: 8.6 mg/dL (ref 8.4–10.5)
Chloride: 102 mEq/L (ref 96–112)
Creatinine, Ser: 0.89 mg/dL (ref 0.50–1.35)
GFR calc Af Amer: >90 mL/min (ref >90)
GFR calc non Af Amer: 83 mL/min — ABNORMAL LOW (ref >90)
Glucose, Bld: 97 mg/dL (ref 70–99)
POTASSIUM: 4 meq/L (ref 3.7–5.3)
SODIUM: 139 meq/L (ref 137–147)

## 2014-01-06 LAB — TSH: TSH: 1.623 u[IU]/mL (ref 0.350–4.500)

## 2014-01-06 SURGERY — LEFT HEART CATHETERIZATION WITH CORONARY ANGIOGRAM
Anesthesia: LOCAL

## 2014-01-06 MED ORDER — LIDOCAINE HCL (PF) 1 % IJ SOLN
INTRAMUSCULAR | Status: AC
  Filled 2014-01-06: qty 30

## 2014-01-06 MED ORDER — SODIUM CHLORIDE 0.9 % IV SOLN
1.0000 mL/kg/h | INTRAVENOUS | Status: DC
  Administered 2014-01-06: 1 mL/kg/h via INTRAVENOUS

## 2014-01-06 MED ORDER — SODIUM CHLORIDE 0.9 % IV SOLN: 1.0000 mL/kg/h | INTRAVENOUS | Status: AC

## 2014-01-06 MED ORDER — NITROGLYCERIN 0.2 MG/ML ON CALL CATH LAB
INTRAVENOUS | Status: AC
  Filled 2014-01-06: qty 1

## 2014-01-06 MED ORDER — SODIUM CHLORIDE 0.9 % IV SOLN: 250.0000 mL | INTRAVENOUS | Status: DC | PRN

## 2014-01-06 MED ORDER — MIDAZOLAM HCL 2 MG/2ML IJ SOLN
INTRAMUSCULAR | Status: AC
  Filled 2014-01-06: qty 2

## 2014-01-06 MED ORDER — SODIUM CHLORIDE 0.9 % IJ SOLN: 3.0000 mL | INTRAMUSCULAR | Status: DC | PRN

## 2014-01-06 MED ORDER — FENTANYL CITRATE 0.05 MG/ML IJ SOLN
INTRAMUSCULAR | Status: AC
  Filled 2014-01-06: qty 2

## 2014-01-06 MED ORDER — VERAPAMIL HCL 2.5 MG/ML IV SOLN
INTRAVENOUS | Status: AC
  Filled 2014-01-06: qty 2

## 2014-01-06 MED ORDER — SODIUM CHLORIDE 0.9 % IJ SOLN: 3.0000 mL | Freq: Two times a day (BID) | INTRAMUSCULAR | Status: DC

## 2014-01-06 MED ORDER — HEPARIN (PORCINE) IN NACL 2-0.9 UNIT/ML-% IJ SOLN
INTRAMUSCULAR | Status: AC
  Filled 2014-01-06: qty 1000

## 2014-01-06 NOTE — H&P (View-Only) (Signed)
 SUBJECTIVE: The patient is doing well today. He reports that he has been having exertional shortness of breath recently. At this time, he denies chest pain or any new concerns.  Echo yesterday demonstrated EF 35-40%, global hypokinesis (cath 12/2010 at [facility] demonstrated normal coronaries, EF 56%)  CURRENT MEDICATIONS: . aspirin EC  81 mg Oral Daily  . carvedilol  3.125 mg Oral BID WC  . heparin  5,000 Units Subcutaneous 3 times per day  . lisinopril  5 mg Oral Daily  . sodium chloride  3 mL Intravenous Q12H  . timolol  1 drop Right Eye QHS      OBJECTIVE: Physical Exam: Filed Vitals:   01/05/14 1223 01/05/14 1602 01/05/14 2000 01/05/14 2315  BP: 134/80 116/60 129/65 136/71  Pulse: 63 58 62 67  Temp: 98.4 F (36.9 C) 98.1 F (36.7 C) 98 F (36.7 C) 97.2 F (36.2 C)  TempSrc: Oral Oral Oral Oral  Resp: 22 17 15 17  Height:      Weight:      SpO2: 99% 99% 97% 96%    Intake/Output Summary (Last 24 hours) at 01/06/14 0653 Last data filed at 01/06/14 0600  Gross per 24 hour  Intake    705 ml  Output    300 ml  Net    405 ml    Telemetry reveals sinus rhythm, no further arrhythmias  GEN- The patient is well appearing, alert and oriented x 3 today.   Head- normocephalic, atraumatic Eyes-  Sclera clear, conjunctiva pink Ears- hearing intact Oropharynx- clear Neck- supple, no JVP Lymph- no cervical lymphadenopathy Lungs- Clear to ausculation bilaterally, normal work of breathing Heart- Regular rate and rhythm, no murmurs, rubs or gallops, PMI not laterally displaced GI- soft, NT, ND, + BS Extremities- no clubbing, cyanosis, or edema Neuro- strength and sensation are intact  LABS: Basic Metabolic Panel:  Recent Labs  01/04/14 1948 01/05/14 0253  NA 139 141  K 4.6 4.8  CL 102 109  CO2 22 22  GLUCOSE 198* 96  BUN 29* 28*  CREATININE 1.50* 1.20  CALCIUM 8.5 7.8*  MG 2.1  --    CBC:  Recent Labs  01/04/14 1948 01/05/14 0253  WBC 12.9* 8.8  NEUTROABS  10.5*  --   HGB 14.4 13.2  HCT 41.3 38.5*  MCV 96.5 96.7  PLT 202 181   Thyroid Function Tests:  Recent Labs  01/05/14 1425  TSH 1.623    RADIOLOGY: Dg Chest Portable 1 View 01/04/2014   CLINICAL DATA:  Atrial fibrillation.  EXAM: PORTABLE CHEST - 1 VIEW  COMPARISON:  None.  FINDINGS: There is cardiomegaly without edema. The lungs are clear. No pneumothorax or pleural effusion.  IMPRESSION: Cardiomegaly without acute disease.   Electronically Signed   By: Thomas  Dalessio M.D.   On: 01/04/2014 20:05    ASSESSMENT AND PLAN:  Principal Problem:   Wide-complex tachycardia Active Problems:   Atrial fibrillation  1.  Chronic systolic dysfunction EF has declined further to 35-40%.  Given exertional SOB and reduced EF in the setting of VT, I would advise cath for CAD assessment (cath 2012).  Risks, benefits,and alternatives to left heart cath with possible PCI were discussed with the patient who wishes to proceed. Add lisinopril and coreg  2. VT/syncope I would recommend washout of flecainide over the weekend.  If cath is unrevealing, will proceed with EP study on Monday to determine need for an ICD.  IF no inducible VT, amiodarone for treatment of   afib would be a reasonable alternative.  If cath reveals CAD and intervention is planned then I would hold off on EP study. Keep in hospital over the weekend.  No driving x 6 months  3. afib maintaining sinus Will require anticoagulation given chads2vasc score of 2 long term.  I would wait until all EP workup is complete to begin anticoagulation unless he goes into afib/ flutter over the weekend.  Could transfer to telemetry post cath Keep in hospital over the weekend Dr Lovena Le to round on patient over the weekend.

## 2014-01-06 NOTE — Interval H&P Note (Signed)
History and Physical Interval Note:  01/06/2014 1:49 PM  Grant Moreno.  has presented today for surgery, with the diagnosis of Wide Complex Tachycardia with Syncope.  The various methods of treatment have been discussed with the patient and family. After consideration of risks, benefits and other options for treatment, the patient has consented to  Procedure(s): LEFT HEART CATHETERIZATION WITH CORONARY ANGIOGRAM (N/A) & Possible PCI as a surgical intervention .  The patient's history has been reviewed, patient examined, no change in status, stable for surgery.  I have reviewed the patient's chart and labs.  Questions were answered to the patient's satisfaction.     Grant Moreno W  Cath Lab Visit (complete for each Cath Lab visit)  Clinical Evaluation Leading to the Procedure:   ACS: no  Non-ACS:    Anginal Classification: CCS II  Anti-ischemic medical therapy: Maximal Therapy (2 or more classes of medications)  Non-Invasive Test Results: Equivocal test results  Prior CABG: No previous CABG

## 2014-01-06 NOTE — ED Provider Notes (Signed)
I have personally seen and examined the patient and discussed plan of care with the resident.  I was present for entire procedure.  I have reviewed the appropriate documentation on PMH/FH/Soc. History.  I have reviewed the documentation of the resident and agree.     Sharyon Cable, MD 01/06/14 [id number]

## 2014-01-06 NOTE — CV Procedure (Signed)
CARDIAC CATHETERIZATION REPORT  NAME:  Grant Moreno.   MRN: [id number] DOB:  10-31-1941   ADMIT DATE: 01/04/2014 Procedure Date: 01/06/2014  INTERVENTIONAL CARDIOLOGIST: Leonie Man, M.D., MS PRIMARY CARE PROVIDER: No PCP Per Patient PRIMARY CARDIOLOGIST: Dr. Saralyn Pilar at [facility]    New to [facility]; referred by Drs. Peter Martinique and Jeneen Rinks Allred  PATIENT:  Grant Moreno. is a 73 y.o. male with a history of atrial fibrillation on Cardizem and flecainide. He has a history of mild reduced LV function with EF of 45% by echo in the past. A nuclear study suggested mid mild inferior scar without ischemia.  He was admitted to [facility] for evaluation of combination of presyncopal and signal episodes. He denies any true syncope, but his wife noted least 3 episodes. On 01/05/2014 at around 6 PM, he experienced an episode just after dinner of near syncope associated with diaphoresis. This time he felt strange enough that he allowed his family called EMS. Upon their arrival he was found to be in a wide-complex tachycardia with rates in the 200s. This was unresponsive to amiodarone and adenosine x2. He is finally treated with urgent DCCV, and brought to the [facility] for evaluation.  He was seen by electrophysiology who recommended cardiac catheterization to confirm the lack of any ischemic etiology for wide-complex tachycardia. This is in the setting of a potentially abnormal stress test in January 2012 (however this was also returned to be a false-positive with normal coronaries in February 2012 a catheterization). He is referred for left heart catheterization with coronary angiography and possible PCI.  PRE-OPERATIVE DIAGNOSIS:    Syncope/near-syncope  Wide-Complex Tachycardia  PROCEDURES PERFORMED:    Left Heart Catheterization Coronary Angiography  PROCEDURE:Consent:  Risks of procedure as well as the alternatives and risks of each were explained to the  (patient/caregiver).  Consent for procedure obtained. Consent for signed by MD and patient with RN witness -- placed on chart.   PROCEDURE: The patient was brought to the [facility] in the fasting state and prepped and draped in the usual sterile fashion for Right groin or radial access. A modified Allen's test with plethysmography was performed, revealing excellent Ulnar artery collateral flow.  Sterile technique was used including antiseptics, cap, gloves, gown, hand hygiene, mask and sheet.  Skin prep: Chlorhexidine.  Time Out: Verified patient identification, verified procedure, site/side was marked, verified correct patient position, special equipment/implants available, medications/allergies/relevent history reviewed, required imaging and test results available.  Performed  Access: Right Radial Artery; 6 Fr Sheath -- Seldinger technique (Angiocath Micropuncture Kit)  IA Radial Cocktail - 10 mL, IV Heparin - 5000 units Diagnostic Left Heart Catheterization:  5 FrTIG 4.0 catheter advanced and removed over long exchange safety J-wire. This catheter was used to engage the Left and Right Coronary Artery for Coronary Angiography, and used to cross the aortic valve for measurement left ventricular hemodynamics.   After completion of the hemodynamics, catheter was removed out of the body completely over wire. The sheath was removed with placement of a TR band.  TR Band:  1420 Hours, 14 mL air  Hemodynamics:  Central Aortic / Mean Pressures: 102/60 mmHg; 79 mmHg  Left Ventricular Pressures / EDP: 103/2 mmHg; 8 mmHg  Left Ventriculography: Not performed  Coronary Anatomy: Essentially in normal, nonobstructive CAD with only minimal luminal irregularities.  Left Main: Large-caliber vessel that bifurcates into the LAD and circumflex. Angiographically normal. LAD: Large-caliber vessel which tapers  into a relatively small caliber vessel as it wraps the apex.  There are 2 major first diagonal branch is better roughly moderate caliber and free of disease. With the exception of mild luminal irregularities the LAD system is free of disease.  Left Circumflex: Large-caliber vessel that terminates as a large lateral OM which reaches the inferolateral apex. It gives off a small AV groove branch that becomes a small LPL 1. As at the takeoff of the most distal branch of the AV groove circumflex there is a roughly 40% stenosis. Otherwise the circumflex system is free of any significant disease.   RCA: Large caliber, dominant vessel with a very proximal SA nodal/atrial branch. The vessel is free of any significant disease and terminates distally as the Right Posterior Descending Artery (RPDA) after it gives off a  Right Posterior AV Groove Branch (RPAV)  RPDA: Moderate caliber vessel restrictions about two thirds with the apex. Free of any significant disease.  RPL Sysytem:The RPAV moderate caliber vessel free of significant disease. Gives off one major RPL branch.  After reviewing the initial angiography, no culprit lesion was identified.   MEDICATIONS:  Anesthesia:  Local Lidocaine 2 ml  Sedation:  2 mg IV Versed, 50 mcg IV fentanyl ;   Omnipaque Contrast: 70 ml  Anticoagulation:  IV Heparin 5000 Units  Radial Cocktail: 5 mg Verapamil, 400 mcg NTG, 2 ml 2% Lidocaine in 10 ml NS  PATIENT DISPOSITION:    The patient was transferred to the PACU holding area in a hemodynamicaly stable, chest pain free condition.  The patient tolerated the procedure well, and there were no complications.  EBL:   < 5 ml  The patient was stable before, during, and after the procedure.  POST-OPERATIVE DIAGNOSIS:    Widely patent, and normal coronary arteries  Normal LVEDP.  Nonischemic cardiomyopathy  PLAN OF CARE:  Returned to the TCU for continued medical therapy after radial cath care.   Anticipate Electrophysiology Study on Monday the EP  consultation.   Leonie Man, M.D., M.S. [facility] [address] [place], [place]  [address]  [phone]  01/06/2014 2:54 PM

## 2014-01-06 NOTE — Progress Notes (Signed)
SUBJECTIVE: The patient is doing well today. He reports that he has been having exertional shortness of breath recently. At this time, he denies chest pain or any new concerns.  Echo yesterday demonstrated EF 35-40%, global hypokinesis (cath 12/2010 at [facility] demonstrated normal coronaries, EF 56%)  CURRENT MEDICATIONS: . aspirin EC  81 mg Oral Daily  . carvedilol  3.125 mg Oral BID WC  . heparin  5,000 Units Subcutaneous 3 times per day  . lisinopril  5 mg Oral Daily  . sodium chloride  3 mL Intravenous Q12H  . timolol  1 drop Right Eye QHS      OBJECTIVE: Physical Exam: Filed Vitals:   01/05/14 1223 01/05/14 1602 01/05/14 2000 01/05/14 2315  BP: 134/80 116/60 129/65 136/71  Pulse: 63 58 62 67  Temp: 98.4 F (36.9 C) 98.1 F (36.7 C) 98 F (36.7 C) 97.2 F (36.2 C)  TempSrc: Oral Oral Oral Oral  Resp: 22 17 15 17   Height:      Weight:      SpO2: 99% 99% 97% 96%    Intake/Output Summary (Last 24 hours) at 01/06/14 6967 Last data filed at 01/06/14 0600  Gross per 24 hour  Intake    705 ml  Output    300 ml  Net    405 ml    Telemetry reveals sinus rhythm, no further arrhythmias  GEN- The patient is well appearing, alert and oriented x 3 today.   Head- normocephalic, atraumatic Eyes-  Sclera clear, conjunctiva pink Ears- hearing intact Oropharynx- clear Neck- supple, no JVP Lymph- no cervical lymphadenopathy Lungs- Clear to ausculation bilaterally, normal work of breathing Heart- Regular rate and rhythm, no murmurs, rubs or gallops, PMI not laterally displaced GI- soft, NT, ND, + BS Extremities- no clubbing, cyanosis, or edema Neuro- strength and sensation are intact  LABS: Basic Metabolic Panel:  Recent Labs  01/04/14 1948 01/05/14 0253  NA 139 141  K 4.6 4.8  CL 102 109  CO2 22 22  GLUCOSE 198* 96  BUN 29* 28*  CREATININE 1.50* 1.20  CALCIUM 8.5 7.8*  MG 2.1  --    CBC:  Recent Labs  01/04/14 1948 01/05/14 0253  WBC 12.9* 8.8  NEUTROABS  10.5*  --   HGB 14.4 13.2  HCT 41.3 38.5*  MCV 96.5 96.7  PLT 202 181   Thyroid Function Tests:  Recent Labs  01/05/14 1425  TSH 1.623    RADIOLOGY: Dg Chest Portable 1 View 01/04/2014   CLINICAL DATA:  Atrial fibrillation.  EXAM: PORTABLE CHEST - 1 VIEW  COMPARISON:  None.  FINDINGS: There is cardiomegaly without edema. The lungs are clear. No pneumothorax or pleural effusion.  IMPRESSION: Cardiomegaly without acute disease.   Electronically Signed   By: Inge Rise M.D.   On: 01/04/2014 20:05    ASSESSMENT AND PLAN:  Principal Problem:   Wide-complex tachycardia Active Problems:   Atrial fibrillation  1.  Chronic systolic dysfunction EF has declined further to 35-40%.  Given exertional SOB and reduced EF in the setting of VT, I would advise cath for CAD assessment (cath 2012).  Risks, benefits,and alternatives to left heart cath with possible PCI were discussed with the patient who wishes to proceed. Add lisinopril and coreg  2. VT/syncope I would recommend washout of flecainide over the weekend.  If cath is unrevealing, will proceed with EP study on Monday to determine need for an ICD.  IF no inducible VT, amiodarone for treatment of  afib would be a reasonable alternative.  If cath reveals CAD and intervention is planned then I would hold off on EP study. Keep in hospital over the weekend.  No driving x 6 months  3. afib maintaining sinus Will require anticoagulation given chads2vasc score of 2 long term.  I would wait until all EP workup is complete to begin anticoagulation unless he goes into afib/ flutter over the weekend.  Could transfer to telemetry post cath Keep in hospital over the weekend Dr Lovena Le to round on patient over the weekend.

## 2014-01-07 LAB — CBC
HCT: 37.1 % — ABNORMAL LOW (ref 39.0–52.0)
HEMOGLOBIN: 12.8 g/dL — AB (ref 13.0–17.0)
MCH: 33.2 pg (ref 26.0–34.0)
MCHC: 34.5 g/dL (ref 30.0–36.0)
MCV: 96.4 fL (ref 78.0–100.0)
Platelets: 146 10*3/uL — ABNORMAL LOW (ref 150–400)
RBC: 3.85 MIL/uL — AB (ref 4.22–5.81)
RDW: 12.8 % (ref 11.5–15.5)
WBC: 9.2 10*3/uL (ref 4.0–10.5)

## 2014-01-07 LAB — CREATININE, SERUM
CREATININE: 0.89 mg/dL (ref 0.50–1.35)
GFR calc Af Amer: >90 mL/min (ref >90)
GFR calc non Af Amer: 83 mL/min — ABNORMAL LOW (ref >90)

## 2014-01-07 NOTE — Progress Notes (Signed)
ELECTROPHYSIOLOGY ROUNDING NOTE    Patient Name: Grant Moreno. Date of Encounter: 01/07/2014    SUBJECTIVE:Pateint feels well this morning.  No chest pain, shortness of breath, pre-syncope, or dizziness.  Catheterization yesterday demonstrated normal coronary arteries, normal EDP, LV gram not done.  Plan for EPS on Monday with ICD if inducible VT  TELEMETRY: Reviewed telemetry pt in sinus rhythm, short run of atrial fibrillation  Filed Vitals:   01/06/14 1823 01/06/14 1955 01/06/14 2000 01/07/14 0530  BP: 108/72 129/69  111/46  Pulse: 71     Temp:   98.1 F (36.7 C) 98.5 F (36.9 C)  TempSrc:   Oral Oral  Resp:      Height:      Weight:      SpO2:  98% 93%     Intake/Output Summary (Last 24 hours) at 01/07/14 0724 Last data filed at 01/06/14 1800  Gross per 24 hour  Intake 1517.21 ml  Output      0 ml  Net 1517.21 ml    CURRENT MEDICATIONS: . aspirin EC  81 mg Oral Daily  . carvedilol  3.125 mg Oral BID WC  . heparin  5,000 Units Subcutaneous 3 times per day  . lisinopril  5 mg Oral Daily  . sodium chloride  3 mL Intravenous Q12H  . timolol  1 drop Right Eye QHS    LABS: Basic Metabolic Panel:  Recent Labs  01/04/14 1948 01/05/14 0253 01/06/14 1947 01/07/14 0325  NA 139 141 139  --   K 4.6 4.8 4.0  --   CL 102 109 102  --   CO2 22 22 23   --   GLUCOSE 198* 96 97  --   BUN 29* 28* 15  --   CREATININE 1.50* 1.20 0.89 0.89  CALCIUM 8.5 7.8* 8.6  --   MG 2.1  --   --   --    CBC:  Recent Labs  01/04/14 1948 01/05/14 0253 01/07/14 0325  WBC 12.9* 8.8 9.2  NEUTROABS 10.5*  --   --   HGB 14.4 13.2 12.8*  HCT 41.3 38.5* 37.1*  MCV 96.5 96.7 96.4  PLT 202 181 146*   Thyroid Function Tests:  Recent Labs  01/05/14 1425  TSH 1.623    Radiology/Studies:  Dg Chest Portable 1 View 01/04/2014   CLINICAL DATA:  Atrial fibrillation.  EXAM: PORTABLE CHEST - 1 VIEW  COMPARISON:  None.  FINDINGS: There is cardiomegaly without edema. The lungs are  clear. No pneumothorax or pleural effusion.  IMPRESSION: Cardiomegaly without acute disease.   Electronically Signed   By: Inge Rise M.D.   On: 01/04/2014 20:05     PHYSICAL EXAM Well appearing NAD HEENT: Unremarkable,[place], AT Neck:  6 JVD, no thyromegally Back:  No CVA tenderness Lungs:  Clear with no wheezes, rales, or rhonchi HEART:  Regular rate rhythm, no murmurs, no rubs, no clicks Abd:  soft, positive bowel sounds, no organomegally, no rebound, no guarding Ext:  2 plus pulses, no edema, no cyanosis, no clubbing Skin:  No rashes no nodules Neuro:  CN II through XII intact, motor grossly intact   Principal Problem:   Wide-complex tachycardia Active Problems:   Atrial fibrillation ?flecainide toxicity  Rec: a difficult combination of problems. It is unclear whether his wide QRS tachy is VT or 1:1 flutter with the former seeming more likely. If he has inducible VT off of flecainide, then he will need ICD implant. If not would place  ILR. With his history of atrial fib, would not go immediately to catheter ablation though that may be needed in the future.  Mikle Bosworth.D.

## 2014-01-08 HISTORY — PX: LOOP RECORDER IMPLANT: SHX5954

## 2014-01-08 HISTORY — PX: ELECTROPHYSIOLOGY STUDY: SHX5802

## 2014-01-08 MED ORDER — SODIUM CHLORIDE 0.9 % IR SOLN
80.0000 mg | Status: DC
  Filled 2014-01-08: qty 2

## 2014-01-08 MED ORDER — SODIUM CHLORIDE 0.9 % IV SOLN
INTRAVENOUS | Status: DC
  Administered 2014-01-09: 06:00:00 via INTRAVENOUS

## 2014-01-08 MED ORDER — CEFAZOLIN SODIUM-DEXTROSE 2-3 GM-% IV SOLR
2.0000 g | INTRAVENOUS | Status: DC
  Filled 2014-01-08: qty 50

## 2014-01-08 MED ORDER — SODIUM CHLORIDE 0.9 % IV SOLN: INTRAVENOUS | Status: DC

## 2014-01-08 NOTE — Progress Notes (Addendum)
Pt and spouse and son are reading the "You Have a Pacemaker and/or ICD " booklet . Electrophysiology Study procedure reviewed w/Pt and family . Pt again adamantly refused to watch the videos available ( even though  the spouse and son wanted to view these videos ).

## 2014-01-08 NOTE — Progress Notes (Signed)
Patient ID: Grant Lurz., male   DOB: 04-Feb-1941, 73 y.o.   MRN: [id number]   ELECTROPHYSIOLOGY ROUNDING NOTE    Patient Name: Grant Moreno. Date of Encounter: 01/08/2014    SUBJECTIVE:Pateint feels well this morning.  No chest pain, shortness of breath, pre-syncope, or dizziness.  Plan for EPS tomorrow with ICD if inducible VT, and ILR if no VT  TELEMETRY: Reviewed telemetry pt in sinus rhythm  Filed Vitals:   01/07/14 1630 01/07/14 2005 01/08/14 0000 01/08/14 0732  BP: 129/70 142/76 118/47 119/57  Pulse:      Temp: 98.3 F (36.8 C) 98 F (36.7 C) 98.8 F (37.1 C) 97.8 F (36.6 C)  TempSrc: Oral Oral Oral Oral  Resp:  18    Height:      Weight:      SpO2: 97% 97% 95% 94%    Intake/Output Summary (Last 24 hours) at 01/08/14 0853 Last data filed at 01/07/14 2255  Gross per 24 hour  Intake    963 ml  Output      0 ml  Net    963 ml    CURRENT MEDICATIONS: . aspirin EC  81 mg Oral Daily  . carvedilol  3.125 mg Oral BID WC  . heparin  5,000 Units Subcutaneous 3 times per day  . lisinopril  5 mg Oral Daily  . sodium chloride  3 mL Intravenous Q12H  . timolol  1 drop Right Eye QHS    LABS: Basic Metabolic Panel:  Recent Labs  01/06/14 1947 01/07/14 0325  NA 139  --   K 4.0  --   CL 102  --   CO2 23  --   GLUCOSE 97  --   BUN 15  --   CREATININE 0.89 0.89  CALCIUM 8.6  --    CBC:  Recent Labs  01/07/14 0325  WBC 9.2  HGB 12.8*  HCT 37.1*  MCV 96.4  PLT 146*   Thyroid Function Tests:  Recent Labs  01/05/14 1425  TSH 1.623    Radiology/Studies:  Dg Chest Portable 1 View 01/04/2014   CLINICAL DATA:  Atrial fibrillation.  EXAM: PORTABLE CHEST - 1 VIEW  COMPARISON:  None.  FINDINGS: There is cardiomegaly without edema. The lungs are clear. No pneumothorax or pleural effusion.  IMPRESSION: Cardiomegaly without acute disease.   Electronically Signed   By: Inge Rise M.D.   On: 01/04/2014 20:05     PHYSICAL EXAM Well appearing NAD HEENT:  Unremarkable,[place], AT Neck:  6 JVD, no thyromegally Back:  No CVA tenderness Lungs:  Clear with no wheezes, rales, or rhonchi HEART:  Regular rate rhythm, no murmurs, no rubs, no clicks Abd:  soft, positive bowel sounds, no organomegally, no rebound, no guarding Ext:  2 plus pulses, no edema, no cyanosis, no clubbing Skin:  No rashes no nodules Neuro:  CN II through XII intact, motor grossly intact   Principal Problem:   Wide-complex tachycardia Active Problems:   Atrial fibrillation ?flecainide toxicity  Rec: a difficult combination of problems. It is unclear whether his wide QRS tachy is VT or 1:1 flutter with the former seeming more likely. If he has inducible VT off of flecainide, then he will need ICD implant. If not would place ILR.   Mikle Bosworth.D.

## 2014-01-08 NOTE — Progress Notes (Signed)
Pt and family watching video # 117 ( Understanding the Electrophysiology  Study )

## 2014-01-08 NOTE — Progress Notes (Signed)
Pt ,spouse and son watching video # 114 ( Implantable Cardioverter Defibrillator ).

## 2014-01-09 ENCOUNTER — Encounter (HOSPITAL_COMMUNITY)
Admission: EM | Disposition: A | Discharge status: Home / Self Care | Payer: Self-pay | Source: Home / Self Care | Attending: Cardiology

## 2014-01-09 DIAGNOSIS — I4729 Other ventricular tachycardia: Secondary | ICD-10-CM

## 2014-01-09 DIAGNOSIS — I472 Ventricular tachycardia, unspecified: Secondary | ICD-10-CM

## 2014-01-09 DIAGNOSIS — R55 Syncope and collapse: Secondary | ICD-10-CM

## 2014-01-09 HISTORY — PX: ELECTROPHYSIOLOGY STUDY: SHX5467

## 2014-01-09 SURGERY — ELECTROPHYSIOLOGY STUDY
Anesthesia: LOCAL

## 2014-01-09 MED ORDER — SODIUM CHLORIDE 0.9 % IV SOLN: 250.0000 mL | INTRAVENOUS | Status: DC | PRN

## 2014-01-09 MED ORDER — FENTANYL CITRATE 0.05 MG/ML IJ SOLN
INTRAMUSCULAR | Status: AC
  Filled 2014-01-09: qty 2

## 2014-01-09 MED ORDER — LOSARTAN POTASSIUM 25 MG PO TABS: 25.0000 mg | ORAL_TABLET | Freq: Every day | ORAL | Status: DC

## 2014-01-09 MED ORDER — BUPIVACAINE HCL (PF) 0.25 % IJ SOLN
INTRAMUSCULAR | Status: AC
Start: 2014-01-09 — End: 2014-01-09
  Filled 2014-01-09: qty 30

## 2014-01-09 MED ORDER — SODIUM CHLORIDE 0.9 % IJ SOLN: 3.0000 mL | INTRAMUSCULAR | Status: DC | PRN

## 2014-01-09 MED ORDER — SODIUM CHLORIDE 0.9 % IJ SOLN: 3.0000 mL | Freq: Two times a day (BID) | INTRAMUSCULAR | Status: DC

## 2014-01-09 MED ORDER — APIXABAN 5 MG PO TABS
5.0000 mg | ORAL_TABLET | Freq: Two times a day (BID) | ORAL | Status: DC
  Administered 2014-01-09: 19:00:00 5 mg via ORAL
  Filled 2014-01-09: qty 1

## 2014-01-09 MED ORDER — CARVEDILOL 3.125 MG PO TABS: 3.1250 mg | ORAL_TABLET | Freq: Two times a day (BID) | ORAL | Status: DC

## 2014-01-09 MED ORDER — APIXABAN 5 MG PO TABS: 5.0000 mg | ORAL_TABLET | Freq: Two times a day (BID) | ORAL | Status: DC

## 2014-01-09 MED ORDER — LIDOCAINE-EPINEPHRINE 1 %-1:100000 IJ SOLN
INTRAMUSCULAR | Status: AC
  Filled 2014-01-09: qty 1

## 2014-01-09 MED ORDER — MIDAZOLAM HCL 5 MG/5ML IJ SOLN
INTRAMUSCULAR | Status: AC
  Filled 2014-01-09: qty 5

## 2014-01-09 NOTE — Interval H&P Note (Signed)
History and Physical Interval Note:  01/09/2014 9:55 AM  Grant Moreno.  has presented today for surgery, with the diagnosis of vt  The various methods of treatment have been discussed with the patient and family. After consideration of risks, benefits and other options for treatment, the patient has consented to  Procedure(s): ELECTROPHYSIOLOGY STUDY +/- ICD, +/- ILR (N/A) as a surgical intervention .  The patient's history has been reviewed, patient examined, no change in status, stable for surgery.  I have reviewed the patient's chart and labs.  Questions were answered to the patient's satisfaction.     Cristopher Peru

## 2014-01-09 NOTE — CV Procedure (Signed)
EP Procedure Note  Procedure: Invasive electrophysiologic study and insertion of an implantable loop recorder  Indication: Recurrent, unexplained syncope, a wide QRS tachycardia in a patient previously on flecainide with the arrhythmia diagnosis thought to be ventricular tachycardia, in the setting of left ventricular dysfunction ejection fraction ranging from 35-45%  Description of procedure: After informed consent was obtained, the patient was taken to the diagnostic electrophysiology laboratory in the fasting state. After the usual preparation and draping, intravenous Versed and fentanyl were given for sedation. A 6 French quadripolar catheter was inserted percutaneously into the right femoral vein and advanced to the right ventricle. A 6 French quadripolar catheter was inserted percutaneously into the right femoral vein and advanced to the his bundle region. After measurement of the basic intervals, rapid ventricular pacing was carried out from the right ventricular apex, and the right ventricular outflow tract, and stepwise decrease down to ventricular refractoriness. VA Wenckebach block block was demonstrated at 320 ms. Additional decrements were carried out down to 280 ms. There was no inducible ventricular tachycardia. Program ventricular stimulation was then carried out at a basic cycle of 50o and 400 ms. S1-S2, S1 S2-S3, and S1-S2 S3-S4 stimuli were delivered. The S1-S2, S2-S3, and S3-S4 intervals were stepwise decrease down to ventricular refractoriness. There is no inducible VT. Programmed atrial stimulation was then carried out on the atrium at a basic cycle length of 500 ms. The S1-S2 interval were stepwise decreased down to 340 ms resulting in the AV node ERP. Next rapid atrial pacing was carried out from the atrium at a pacing cycle length of 500 ms, and stepwise decrease down to 380 ms were AV Wenckebach was observed. There is no inducible SVT. Finally, additional pacing was carried out down  to an atrial cycle length of 220 ms and there is no inducible SVT. At this point the patient was prepared for implantable loop recorder insertion.  After the usual preparation and draping, 20 cc of lidocaine was infiltrated into the left pectoral region. A 1 cm stab incision was carried out and the Medtronic implantable loop recorder, serial number [id number] was inserted into the subcutaneous space of the left pectoral region. The R waves measured 0.3 mV. Pressure was held, benzoin and Steri-Strips were patent on the skin, and a bandage was placed. The patient was returned to his room in satisfactory condition.  Complications: There were no immediate complications  Conclusion: This demonstrates successful electrophysiologic study of a patient with a history of syncope who presented with a wide QRS tachycardia and left ventricular dysfunction on flecainide. There were no inducible ventricular arrhythmias after flecainide was allowed to wash out during electrophysiologic testing. Because of the patient's history of syncope, which was unexplained, an implantable loop recorder was placed.  Cristopher Peru, M.D.

## 2014-01-09 NOTE — Discharge Summary (Signed)
Physician Discharge Summary  Patient ID: Grant Moreno. MRN: [id number] DOB/AGE: [birth year] 74 y.o.  Admit date: 01/04/2014 Discharge date: 01/09/2014  Admission Diagnoses: Wide Complete Tachycardia  Discharge Diagnoses:  Principal Problem:   Wide-complex tachycardia Active Problems:   Atrial fibrillation   Discharged Condition: stable  Hospital Course: Grant Vold. is a 73 y.o. male with paroxysmal atrial fibrillation and atrial flutter diagnosed in 2011, chronic LBBB, normal coronary arteries LVEF (45%), s/p cardiac cath in Feb 2012, who was admitted on 01/04/14 with near syncope. He has been followed by Dr. Saralyn Pilar for atrial fibrillation and is currently taking flecainide and metoprolol. Over the last 3 months Grant Moreno reports intermittent dizziness / near syncope and 3 episodes of syncope. All of the near syncopal episodes are similar in that he has sudden onset of racing palpitations accompanied by dizziness and abdominal fullness. His syncopal episodes have been ongoing for years and seem to be more related to postural changes and Grant Moreno states his BP always runs low.  The day of admission, after eating dinner, he experienced his worst episode which was more severe than previous and was accompanied by profound diaphoresis. EMS was activated and on their arrival, he was found to be in a WCT with rate of 228 bpm. Per EMS report, he was given amiodarone and adenosine without improvement or change in rhythm. He became more symptomatic and was cardioverted in the field.  His EKG tracing appeared to be consistent with VT. On admission he was in Grant Moreno. His work-up was notable for negative troponin, elevated BNP of 1027, normal potassium 4.6, normal magnesium 2.1. Echo showed a decrease in systolic function to 19-41%. Thyroid studies were normal. His Flecainide was discontinued. A LHC was recommended to evaluated his worsening EF. The procedure was performed by Dr. Ellyn Hack. He was found to have a  NICM with widely patent and normal coronary arteries and normal LVEDP. He later underwent an EP study, which was performed by Dr. Lovena Le.   A 6 French quadripolar catheter was inserted percutaneously into the right femoral vein and advanced to the right ventricle. A 6 French quadripolar catheter was inserted percutaneously into the right femoral vein and advanced to the his bundle region. After measurement of the basic intervals, rapid ventricular pacing was carried out from the right ventricular apex, and the right ventricular outflow tract, and stepwise decrease down to ventricular refractoriness. VA Wenckebach block block was demonstrated at 320 ms. Additional decrements were carried out down to 280 ms. There was no inducible ventricular tachycardia. Program ventricular stimulation was then carried out at a basic cycle of 50o and 400 ms. S1-S2, S1 S2-S3, and S1-S2 S3-S4 stimuli were delivered. The S1-S2, S2-S3, and S3-S4 intervals were stepwise decrease down to ventricular refractoriness. There is no inducible VT. Programmed atrial stimulation was then carried out on the atrium at a basic cycle length of 500 ms. The S1-S2 interval were stepwise decreased down to 340 ms resulting in the AV node ERP. Next rapid atrial pacing was carried out from the atrium at a pacing cycle length of 500 ms, and stepwise decrease down to 380 ms were AV Wenckebach was observed. There is no inducible SVT. Finally, additional pacing was carried out down to an atrial cycle length of 220 ms and there is no inducible SVT. It was then decided to insert an implantable loop recorder. He underwent successful insertion of a Medtronic implantable loop recorder. There were no complications. He was started on 5 mg of  Eliquis BID for anticoagulation. His Lopressor was discontinued and 3.125 mg of Coreg was initiated, as well as 25 mg of Losartan daily. He was instructed not to resume Flecainide.  He was last seen and examined by Dr. Lovena Le, who  determined he was stable for discharge home. He will f/u in clinic with Dr. Caryl Comes.    Consults: None  Significant Diagnostic Studies:   LHC 01/06/14 POST-OPERATIVE DIAGNOSIS:  Widely patent, and normal coronary arteries  Normal LVEDP.  Nonischemic cardiomyopathy  EP Study 01/09/14 See Findings above.   Treatments: See Hospital Course   Discharge Exam: Blood pressure 129/68, pulse 64, temperature 98.4 F (36.9 C), temperature source Oral, resp. rate 16, height 6\' 4"  (1.93 m), weight 224 lb (101.606 kg), SpO2 95.00%.   Disposition: Final discharge disposition not confirmed      Discharge Orders   Future Appointments Provider Department Dept Phone   02/06/2014 8:45 AM Deboraha Sprang, MD [facility] [phone]   Future Orders Complete By Expires   Diet - low sodium heart healthy  As directed    Increase activity slowly  As directed        Medication List    STOP taking these medications       aspirin EC 81 MG tablet     flecainide 50 MG tablet  Commonly known as:  TAMBOCOR     metoprolol tartrate 25 MG tablet  Commonly known as:  LOPRESSOR      TAKE these medications       apixaban 5 MG Tabs tablet  Commonly known as:  ELIQUIS  Take 1 tablet (5 mg total) by mouth 2 (two) times daily.     carvedilol 3.125 MG tablet  Commonly known as:  COREG  Take 1 tablet (3.125 mg total) by mouth 2 (two) times daily with a meal.     cholecalciferol 1000 UNITS tablet  Commonly known as:  VITAMIN D  Take 1,000 Units by mouth daily.     losartan 25 MG tablet  Commonly known as:  COZAAR  Take 1 tablet (25 mg total) by mouth daily.  Start taking on:  01/10/2014     timolol 0.5 % ophthalmic solution  Commonly known as:  TIMOPTIC  Place 1 drop into the right eye at bedtime.     vitamin B-12 1000 MCG tablet  Commonly known as:  CYANOCOBALAMIN  Take 1,000 mcg by mouth daily.       Follow-up Information   Follow up with Virl Axe, MD On 02/06/2014.  (8:45 am)    Specialty:  Cardiology   Contact information:   [address] [place] [address] [phone]     TIME SPENT ON DISCHARGE, INCLUDING PHYSICIAN TIME: >30 MINUTES  Signed: Lyda Jester 01/09/2014, 5:52 PM

## 2014-01-09 NOTE — H&P (View-Only) (Signed)
Patient ID: Grant Hosang Jr., male   DOB: 05/14/1941, 72 y.o.   MRN: [id number]   ELECTROPHYSIOLOGY ROUNDING NOTE    Patient Name: Grant Colon Jr. Date of Encounter: 01/08/2014    SUBJECTIVE:Pateint feels well this morning.  No chest pain, shortness of breath, pre-syncope, or dizziness.  Plan for EPS tomorrow with ICD if inducible VT, and ILR if no VT  TELEMETRY: Reviewed telemetry pt in sinus rhythm  Filed Vitals:   01/07/14 1630 01/07/14 2005 01/08/14 0000 01/08/14 0732  BP: 129/70 142/76 118/47 119/57  Pulse:      Temp: 98.3 F (36.8 C) 98 F (36.7 C) 98.8 F (37.1 C) 97.8 F (36.6 C)  TempSrc: Oral Oral Oral Oral  Resp:  18    Height:      Weight:      SpO2: 97% 97% 95% 94%    Intake/Output Summary (Last 24 hours) at 01/08/14 0853 Last data filed at 01/07/14 2255  Gross per 24 hour  Intake    963 ml  Output      0 ml  Net    963 ml    CURRENT MEDICATIONS: . aspirin EC  81 mg Oral Daily  . carvedilol  3.125 mg Oral BID WC  . heparin  5,000 Units Subcutaneous 3 times per day  . lisinopril  5 mg Oral Daily  . sodium chloride  3 mL Intravenous Q12H  . timolol  1 drop Right Eye QHS    LABS: Basic Metabolic Panel:  Recent Labs  01/06/14 1947 01/07/14 0325  NA 139  --   K 4.0  --   CL 102  --   CO2 23  --   GLUCOSE 97  --   BUN 15  --   CREATININE 0.89 0.89  CALCIUM 8.6  --    CBC:  Recent Labs  01/07/14 0325  WBC 9.2  HGB 12.8*  HCT 37.1*  MCV 96.4  PLT 146*   Thyroid Function Tests:  Recent Labs  01/05/14 1425  TSH 1.623    Radiology/Studies:  Dg Chest Portable 1 View 01/04/2014   CLINICAL DATA:  Atrial fibrillation.  EXAM: PORTABLE CHEST - 1 VIEW  COMPARISON:  None.  FINDINGS: There is cardiomegaly without edema. The lungs are clear. No pneumothorax or pleural effusion.  IMPRESSION: Cardiomegaly without acute disease.   Electronically Signed   By: Thomas  Dalessio M.D.   On: 01/04/2014 20:05     PHYSICAL EXAM Well appearing NAD HEENT:  Unremarkable,[place], AT Neck:  6 JVD, no thyromegally Back:  No CVA tenderness Lungs:  Clear with no wheezes, rales, or rhonchi HEART:  Regular rate rhythm, no murmurs, no rubs, no clicks Abd:  soft, positive bowel sounds, no organomegally, no rebound, no guarding Ext:  2 plus pulses, no edema, no cyanosis, no clubbing Skin:  No rashes no nodules Neuro:  CN II through XII intact, motor grossly intact   Principal Problem:   Wide-complex tachycardia Active Problems:   Atrial fibrillation ?flecainide toxicity  Rec: a difficult combination of problems. It is unclear whether his wide QRS tachy is VT or 1:1 flutter with the former seeming more likely. If he has inducible VT off of flecainide, then he will need ICD implant. If not would place ILR.   Mayola Mcbain,M.D.    

## 2014-01-11 ENCOUNTER — Ambulatory Visit (INDEPENDENT_AMBULATORY_CARE_PROVIDER_SITE_OTHER): Payer: Medicare Other | Admitting: *Deleted

## 2014-01-11 ENCOUNTER — Telehealth: Payer: Self-pay | Admitting: *Deleted

## 2014-01-11 DIAGNOSIS — I4891 Unspecified atrial fibrillation: Secondary | ICD-10-CM

## 2014-01-11 LAB — FLECAINIDE LEVEL: Flecainide: 0.11 ug/mL — ABNORMAL LOW (ref 0.20–1.00)

## 2014-01-11 NOTE — Progress Notes (Signed)
Add on wound check for oozing at the site of ILR done 01/09/14.  The site was noted to have minimal oozing and was redressed.  He will follow up as scheduled with Dr. Caryl Comes in [place].

## 2014-01-11 NOTE — Telephone Encounter (Signed)
Spoke with pt who reports he had loop recorder placed on 2/23. Went home about 6:30 that evening. He reports oozing from insertion site. Changed dressing 3 times yesterday and once this morning. I spoke with Nevin Bloodgood in the device clinic and they will see pt today.  I spoke with pt and he will come to office now for appt.

## 2014-01-13 ENCOUNTER — Encounter (HOSPITAL_COMMUNITY): Payer: Self-pay | Admitting: *Deleted

## 2014-01-13 ENCOUNTER — Telehealth: Payer: Self-pay

## 2014-01-13 NOTE — Telephone Encounter (Signed)
Pt called and asked if he should change his wound bandage, or wait until his appt . He is scheduled to see Dr. Caryl Comes on 3/17. Please call and advise.

## 2014-01-13 NOTE — Telephone Encounter (Signed)
I informed patient that it would be fine for him to remove the bandage tomorrow, but I encouraged him to call back if he notices any bleeding or oozing coming from the site. Patient voiced understanding. He also knows that it is acceptable for him to shower.

## 2014-01-20 ENCOUNTER — Telehealth: Payer: Self-pay

## 2014-01-20 NOTE — Telephone Encounter (Signed)
Pt called and states he received a letter from his ins company and they are requiring our office to call and state why pt needs to be on Eliquis. Please call.

## 2014-01-20 NOTE — Telephone Encounter (Signed)
Patient needs [place] to call insurance company [id number] [phone]) [phone] and let them know why he should be on Eliquis by 02/06/14. I informed patient that I would call and get the prior auth faxed over today.    Suszanne Conners will fax form today from [employer] [facility] to be filled out.

## 2014-01-23 NOTE — Telephone Encounter (Signed)
Completed prior auth over the phone per patient request 3/6

## 2014-01-25 NOTE — Telephone Encounter (Signed)
Pt has been approved for Eliquis 01/23/2014-01/24/2015.

## 2014-01-31 ENCOUNTER — Encounter: Payer: Self-pay | Admitting: Internal Medicine

## 2014-01-31 ENCOUNTER — Ambulatory Visit (INDEPENDENT_AMBULATORY_CARE_PROVIDER_SITE_OTHER): Payer: Medicare Other | Admitting: Internal Medicine

## 2014-01-31 VITALS — BP 137/79 | HR 70 | Ht 76.0 in | Wt 234.8 lb

## 2014-01-31 DIAGNOSIS — I4891 Unspecified atrial fibrillation: Secondary | ICD-10-CM

## 2014-01-31 DIAGNOSIS — I5022 Chronic systolic (congestive) heart failure: Secondary | ICD-10-CM

## 2014-01-31 DIAGNOSIS — I429 Cardiomyopathy, unspecified: Secondary | ICD-10-CM | POA: Insufficient documentation

## 2014-01-31 DIAGNOSIS — I428 Other cardiomyopathies: Secondary | ICD-10-CM

## 2014-01-31 DIAGNOSIS — I423 Endomyocardial (eosinophilic) disease: Secondary | ICD-10-CM | POA: Insufficient documentation

## 2014-01-31 DIAGNOSIS — Z959 Presence of cardiac and vascular implant and graft, unspecified: Secondary | ICD-10-CM | POA: Insufficient documentation

## 2014-01-31 LAB — MDC_IDC_ENUM_SESS_TYPE_INCLINIC

## 2014-01-31 MED ORDER — APIXABAN 5 MG PO TABS: 5.0000 mg | ORAL_TABLET | Freq: Two times a day (BID) | ORAL | Status: DC

## 2014-01-31 MED ORDER — LOSARTAN POTASSIUM 25 MG PO TABS: 25.0000 mg | ORAL_TABLET | Freq: Every day | ORAL | Status: DC

## 2014-01-31 MED ORDER — CARVEDILOL 12.5 MG PO TABS: 12.5000 mg | ORAL_TABLET | Freq: Two times a day (BID) | ORAL | Status: DC

## 2014-01-31 NOTE — Patient Instructions (Signed)
Your physician has recommended you make the following change in your medication:  Take Losartan at bedtime  Take Carvedilol as directed   Your physician recommends that you schedule a follow-up appointment in:  4 weeks

## 2014-01-31 NOTE — Progress Notes (Signed)
Patient Care Team: No Pcp Per Patient as PCP - General (General Practice)   HPI  Grant Moreno. is a 73 y.o. male  Seen in followup for syncope with Wide QRS Tachycardia felt to be VT , noninducibility at EPS with LV dysfunction; cath normal CA 2/15  Presentation episode which was more severe than previous and was accompanied by profound diaphoresis. EMS was activated and on their arrival, he was found to be in a WCT with rate of 228 bpm. Per EMS report, he was given amiodarone and adenosine without improvement or change in rhythm. He became more symptomatic and was cardioverted in the field   Has Hx of PAR Rx with flecainiide the flecainide was discontinued the context of left ventricular dysfunction.  The patient has a history of heart failure which has been absent since he was started on beta blockers and ACE inhibitors at his hospitalization. There is no edema orthopnea or nocturnal dyspnea.  He has had no recurrent syncope or recurrent tachypalpitations.   Past Medical History  Diagnosis Date  . A-fib   . Syncope   . Bronchitis, chronic     Wife reports that patient gets bronchitis multiple times a year.  . Flu     Documented Flu January 2015 - treated with Tamiflu    Past Surgical History  Procedure Laterality Date  . Finger surgery      Noted the only time patient has been admitted overnight.    . Electrophysiology study  01-08-14    negative EPS by Dr Lovena Le following syncopal episode and documented WCT  . Loop recorder implant  01-08-14    MDT LinQ implanted by Dr Lovena Le for syncope and WCT    Current Outpatient Prescriptions  Medication Sig Dispense Refill  . apixaban (ELIQUIS) 5 MG TABS tablet Take 1 tablet (5 mg total) by mouth 2 (two) times daily.  60 tablet  5  . carvedilol (COREG) 3.125 MG tablet Take 1 tablet (3.125 mg total) by mouth 2 (two) times daily with a meal.  60 tablet  5  . cholecalciferol (VITAMIN D) 1000 UNITS tablet Take 1,000 Units by  mouth daily.      Marland Kitchen losartan (COZAAR) 25 MG tablet Take 1 tablet (25 mg total) by mouth daily.  30 tablet  5  . timolol (TIMOPTIC) 0.5 % ophthalmic solution Place 1 drop into the right eye at bedtime.      . vitamin B-12 (CYANOCOBALAMIN) 1000 MCG tablet Take 1,000 mcg by mouth daily.       No current facility-administered medications for this visit.    No Known Allergies  Review of Systems negative except from HPI and PMH  Physical Exam BP 137/79  Pulse 70  Ht 6\' 4"  (1.93 m)  Wt 234 lb 12 oz (106.482 kg)  BMI 28.59 kg/m2 Well developed and nourished in no acute distress HENT normal Neck supple with JVP-flat Clear Regular rate and rhythm, no murmurs or gallops Abd-soft with active BS No Clubbing cyanosis edema Skin-warm and dry A & Oriented  Grossly normal sensory and motor function  ECG demonstrates sinus rhythm at 70 Intervals 15/12/38 Left bundle branch block like pattern    Assessment and  Plan  Nonischemic cardio myopathy  Ventricular tachycardia  Atrial fibrillation  Syncope  Wide QRS-left bundle branch block like  Congestive heart failure-chronic-systolic  Linq  Loop recorder  The patient had syncope in the context of nonischemic cardiomyopathy and left bundle branch block. He  also has symptoms of congestive heart failure that are much improved and is euvolemic. He tolerated his introduction and ACE inhibitors and beta blockers relatively well and will increase his carvedilol 3--6.25 twice daily. Because of some initial dizziness, we will change his losartan to each bedtime.  I have some ambivalence as to the course of therapy that was pursued. The sensitivity of EP testing and nonischemic cardio myopathy is poor and as such, with syncope/presyncope and ventricular tachycardia I probably would have pursued ICD implantation. However, the issue of flecainide proarrhythmia cannot be excluded. I suggested to the family that the event that he would have recurrent  tachycardia we would implant ICD.  For now he is advised not to drive.  We'll continue him on apixaban given his history of atrial fibrillationand withhold antiarrhythmic therapy in the absence of recurrence

## 2014-02-02 ENCOUNTER — Telehealth: Payer: Self-pay | Admitting: *Deleted

## 2014-02-02 MED ORDER — APIXABAN 5 MG PO TABS: 5.0000 mg | ORAL_TABLET | Freq: Two times a day (BID) | ORAL | Status: DC

## 2014-02-02 NOTE — Telephone Encounter (Signed)
Pharmacy needed RX for 180 tablets of Eliquis

## 2014-02-06 ENCOUNTER — Encounter: Payer: Medicare Other | Admitting: Internal Medicine

## 2014-02-09 ENCOUNTER — Ambulatory Visit (INDEPENDENT_AMBULATORY_CARE_PROVIDER_SITE_OTHER): Payer: Medicare Other | Admitting: *Deleted

## 2014-02-09 DIAGNOSIS — I6350 Cerebral infarction due to unspecified occlusion or stenosis of unspecified cerebral artery: Secondary | ICD-10-CM

## 2014-02-20 ENCOUNTER — Encounter: Payer: Self-pay | Admitting: Internal Medicine

## 2014-02-28 ENCOUNTER — Encounter: Payer: Self-pay | Admitting: Internal Medicine

## 2014-02-28 ENCOUNTER — Ambulatory Visit (INDEPENDENT_AMBULATORY_CARE_PROVIDER_SITE_OTHER): Payer: Medicare Other | Admitting: Internal Medicine

## 2014-02-28 VITALS — BP 138/77 | HR 62 | Ht 76.0 in | Wt 226.5 lb

## 2014-02-28 DIAGNOSIS — I4891 Unspecified atrial fibrillation: Secondary | ICD-10-CM

## 2014-02-28 LAB — MDC_IDC_ENUM_SESS_TYPE_INCLINIC

## 2014-02-28 NOTE — Progress Notes (Signed)
      Patient Care Team: No Pcp Per Patient as PCP - General (General Practice)   HPI  Grant Bring. is a 73 y.o. male Seen in followup for syncope with Wide QRS Tachycardia felt to be VT , noninducibility at EPS with LV dysfunction; cath normal CA 2/15 It was elected to place an event recorder.  there have been no intercurrent events.  He has tolerated the up titration of his carvedilol  I told him i would have a low threshold for device implantataion given the poor sensitivity of EP testing in [facility]  Past Medical History  Diagnosis Date  . A-fib   . Syncope   . Bronchitis, chronic     Wife reports that patient gets bronchitis multiple times a year.  . Flu     Documented Flu January 2015 - treated with Tamiflu  . implantable loop recorder 01/31/2014  . Chronic systolic heart failure 05/23/2375  . Nonischemic cardiomyopathy 01/31/2014  . Ventricular tachycardia (paroxysmal) 01/04/2014    Past Surgical History  Procedure Laterality Date  . Finger surgery      Noted the only time patient has been admitted overnight.    . Electrophysiology study  01-08-14    negative EPS by Dr Lovena Le following syncopal episode and documented WCT  . Loop recorder implant  01-08-14    MDT LinQ implanted by Dr Lovena Le for syncope and WCT    Current Outpatient Prescriptions  Medication Sig Dispense Refill  . apixaban (ELIQUIS) 5 MG TABS tablet Take 1 tablet (5 mg total) by mouth 2 (two) times daily.  180 tablet  3  . carvedilol (COREG) 12.5 MG tablet Take 1 tablet (12.5 mg total) by mouth 2 (two) times daily. Take as directed  90 tablet  3  . cholecalciferol (VITAMIN D) 1000 UNITS tablet Take 1,000 Units by mouth daily.      Marland Kitchen losartan (COZAAR) 25 MG tablet Take 1 tablet (25 mg total) by mouth at bedtime.  90 tablet  3  . timolol (TIMOPTIC) 0.5 % ophthalmic solution Place 1 drop into the right eye at bedtime.      . vitamin B-12 (CYANOCOBALAMIN) 1000 MCG tablet Take 1,000 mcg by mouth daily.        No current facility-administered medications for this visit.    No Known Allergies  Review of Systems negative except from HPI and PMH  Physical Exam BP 138/77  Pulse 62  Ht 6\' 4"  (1.93 m)  Wt 226 lb 8 oz (102.74 kg)  BMI 27.58 kg/m2 Well developed and nourished in no acute distress HENT normal Neck supple with JVP-flat Clear Regular rate and rhythm, no murmurs or gallops Abd-soft with active BS No Clubbing cyanosis edema Skin-warm and dry A & Oriented  Grossly normal sensory and motor function   ECG demonstrates sinus rhythm at 62 PVCs Intervals 16/12/42 Left axis deviation QRS widening without left bundle branch block  deviation Assessment and  Plan  Syncope.  Atrial fibrillation  Wide QRS tachycardia   Congestive heart failure-chronic systolic  Nonischemic cardiomyopathy  Loop recorder  Left axis deviation/IVCD  The patient is euvolemic. We will further uptitrate his carvedilol to 6.25--12.5. We will continue him on his other medications.  We will check a metabolic profile in his ARB

## 2014-02-28 NOTE — Patient Instructions (Signed)
Your physician has recommended you make the following change in your medication:  Take your Coreg as ordered 12.5 mg twice a day   Your physician recommends that you have lab work today: [facility]  Your physician recommends that you schedule a follow-up appointment in:  6 weeks   Remote monitoring is used to monitor your Pacemaker of ICD from home. This monitoring reduces the number of office visits required to check your device to one time per year. It allows [place] to keep an eye on the functioning of your device to ensure it is working properly. You are scheduled for a device check from home on 06/01/14. You may send your transmission at any time that day. If you have a wireless device, the transmission will be sent automatically. After your physician reviews your transmission, you will receive a postcard with your next transmission date.

## 2014-03-01 ENCOUNTER — Telehealth: Payer: Self-pay | Admitting: *Deleted

## 2014-03-01 LAB — BASIC METABOLIC PANEL
BUN / CREAT RATIO: 23 — AB (ref 10–22)
BUN: 18 mg/dL (ref 8–27)
CALCIUM: 8.9 mg/dL (ref 8.6–10.2)
CO2: 23 mmol/L (ref 18–29)
CREATININE: 0.79 mg/dL (ref 0.76–1.27)
Chloride: 103 mmol/L (ref 97–108)
GFR calc non Af Amer: 89 mL/min/{1.73_m2} (ref >59)
GFR, EST AFRICAN AMERICAN: 103 mL/min/{1.73_m2} (ref >59)
Glucose: 95 mg/dL (ref 65–99)
POTASSIUM: 5 mmol/L (ref 3.5–5.2)
SODIUM: 144 mmol/L (ref 134–144)

## 2014-03-01 NOTE — Telephone Encounter (Signed)
A user error has taken place: encounter opened in error, closed for administrative reasons.

## 2014-03-08 LAB — MDC_IDC_ENUM_SESS_TYPE_REMOTE

## 2014-03-13 ENCOUNTER — Ambulatory Visit (INDEPENDENT_AMBULATORY_CARE_PROVIDER_SITE_OTHER): Payer: Medicare Other | Admitting: *Deleted

## 2014-03-13 DIAGNOSIS — R55 Syncope and collapse: Secondary | ICD-10-CM

## 2014-04-04 ENCOUNTER — Encounter: Payer: Self-pay | Admitting: Internal Medicine

## 2014-04-12 ENCOUNTER — Ambulatory Visit (INDEPENDENT_AMBULATORY_CARE_PROVIDER_SITE_OTHER): Payer: Medicare Other

## 2014-04-12 DIAGNOSIS — R55 Syncope and collapse: Secondary | ICD-10-CM

## 2014-04-13 ENCOUNTER — Ambulatory Visit (INDEPENDENT_AMBULATORY_CARE_PROVIDER_SITE_OTHER): Payer: Medicare Other | Admitting: Internal Medicine

## 2014-04-13 ENCOUNTER — Encounter: Payer: Self-pay | Admitting: Internal Medicine

## 2014-04-13 VITALS — BP 110/70 | HR 63 | Ht 76.0 in | Wt 224.2 lb

## 2014-04-13 DIAGNOSIS — I4891 Unspecified atrial fibrillation: Secondary | ICD-10-CM

## 2014-04-13 DIAGNOSIS — I6350 Cerebral infarction due to unspecified occlusion or stenosis of unspecified cerebral artery: Secondary | ICD-10-CM

## 2014-04-13 LAB — MDC_IDC_ENUM_SESS_TYPE_INCLINIC

## 2014-04-13 MED ORDER — CARVEDILOL 12.5 MG PO TABS: 18.7500 mg | ORAL_TABLET | Freq: Two times a day (BID) | ORAL | Status: DC

## 2014-04-13 NOTE — Progress Notes (Signed)
      Patient Care Team: [name], MD as PCP - General (Internal Medicine)   HPI  Grant Moreno. is a 73 y.o. male Seen in followup for syncope with Wide QRS Tachycardia felt to be VT , noninducibility at EPS with LV dysfunction; cath normal CA 2/15 It was elected to place an event recorder.  there have been no intercurrent events.  He has tolerated the up titration of his carvedilol  I told him i would have a low threshold for device implantataion given the poor sensitivity of EP testing in [facility]  Past Medical History  Diagnosis Date  . A-fib   . Syncope   . Bronchitis, chronic     Wife reports that patient gets bronchitis multiple times a year.  . Flu     Documented Flu January 2015 - treated with Tamiflu  . implantable loop recorder 01/31/2014  . Chronic systolic heart failure 9/32/6712  . Nonischemic cardiomyopathy 01/31/2014  . Ventricular tachycardia (paroxysmal) 01/04/2014  . Bronchitis     Past Surgical History  Procedure Laterality Date  . Finger surgery      Noted the only time patient has been admitted overnight.    . Electrophysiology study  01-08-14    negative EPS by Dr Lovena Le following syncopal episode and documented WCT  . Loop recorder implant  01-08-14    MDT LinQ implanted by Dr Lovena Le for syncope and WCT    Current Outpatient Prescriptions  Medication Sig Dispense Refill  . apixaban (ELIQUIS) 5 MG TABS tablet Take 1 tablet (5 mg total) by mouth 2 (two) times daily.  180 tablet  3  . carvedilol (COREG) 12.5 MG tablet Take 1 tablet (12.5 mg total) by mouth 2 (two) times daily. Take as directed  90 tablet  3  . cholecalciferol (VITAMIN D) 1000 UNITS tablet Take 1,000 Units by mouth daily.      Marland Kitchen losartan (COZAAR) 25 MG tablet Take 1 tablet (25 mg total) by mouth at bedtime.  90 tablet  3  . timolol (TIMOPTIC) 0.5 % ophthalmic solution Place 1 drop into the right eye at bedtime.      . vitamin B-12 (CYANOCOBALAMIN) 1000 MCG tablet Take 1,000 mcg by  mouth daily.       No current facility-administered medications for this visit.    No Known Allergies  Review of Systems negative except from HPI and PMH  Physical Exam BP 110/70  Pulse 63  Ht 6\' 4"  (1.93 m)  Wt 224 lb 4 oz (101.719 kg)  BMI 27.31 kg/m2 Well developed and nourished in no acute distress HENT normal Neck supple with JVP-flat Clear Regular rate and rhythm, no murmurs or gallops Abd-soft with active BS No Clubbing cyanosis edema Skin-warm and dry A & Oriented  Grossly normal sensory and motor function   ECG demonstrates sinus rhythm at 62 PVCs Intervals 16/12/42 Left axis deviation QRS widening without left bundle branch block  deviation Assessment and  Plan  Syncope.  Atrial fibrillation  Wide QRS tachycardia   Congestive heart failure-chronic systolic  Nonischemic cardiomyopathy  Loop recorder  Left axis deviation/IVCD  The patient is euvolemic. We will further uptitrate his carvedilol to  12.5.>>18.75  We will continue him on his other medications.  We will check a metabolic profile in his ARB  When he returns from his trip 8 weeks, we will increase his carvedilol further to 25 twice a day.

## 2014-04-13 NOTE — Patient Instructions (Addendum)
Remote monitoring is used to monitor your Pacemaker of ICD from home. This monitoring reduces the number of office visits required to check your device to one time per year. It allows [place] to keep an eye on the functioning of your device to ensure it is working properly. You are scheduled for a device check from home on 07/17/14. You may send your transmission at any time that day. If you have a wireless device, the transmission will be sent automatically. After your physician reviews your transmission, you will receive a postcard with your next transmission date.  Your physician has recommended you make the following change in your medication:  Increase Coreg to 18.75 mg twice daily  Increase Coreg to 25 mg twice daily when you return from your trip    Your physician recommends that you schedule a follow-up appointment in:  Follow up in 10-12 weeks

## 2014-05-12 ENCOUNTER — Ambulatory Visit (INDEPENDENT_AMBULATORY_CARE_PROVIDER_SITE_OTHER): Payer: Medicare Other | Admitting: *Deleted

## 2014-05-12 DIAGNOSIS — R55 Syncope and collapse: Secondary | ICD-10-CM

## 2014-05-12 LAB — MDC_IDC_ENUM_SESS_TYPE_REMOTE

## 2014-05-16 NOTE — Progress Notes (Signed)
Loop recorder 

## 2014-06-12 ENCOUNTER — Ambulatory Visit (INDEPENDENT_AMBULATORY_CARE_PROVIDER_SITE_OTHER): Payer: Medicare Other | Admitting: *Deleted

## 2014-06-12 DIAGNOSIS — R55 Syncope and collapse: Secondary | ICD-10-CM

## 2014-06-14 NOTE — Progress Notes (Signed)
Loop recorder 

## 2014-07-01 DIAGNOSIS — E785 Hyperlipidemia, unspecified: Secondary | ICD-10-CM | POA: Insufficient documentation

## 2014-07-01 DIAGNOSIS — I10 Essential (primary) hypertension: Secondary | ICD-10-CM | POA: Insufficient documentation

## 2014-07-01 DIAGNOSIS — R739 Hyperglycemia, unspecified: Secondary | ICD-10-CM | POA: Insufficient documentation

## 2014-07-01 DIAGNOSIS — J449 Chronic obstructive pulmonary disease, unspecified: Secondary | ICD-10-CM | POA: Insufficient documentation

## 2014-07-04 ENCOUNTER — Telehealth: Payer: Self-pay

## 2014-07-04 ENCOUNTER — Encounter: Payer: Self-pay | Admitting: Internal Medicine

## 2014-07-04 ENCOUNTER — Ambulatory Visit (INDEPENDENT_AMBULATORY_CARE_PROVIDER_SITE_OTHER): Payer: Medicare Other | Admitting: Internal Medicine

## 2014-07-04 VITALS — BP 108/70 | HR 53 | Ht 76.0 in | Wt 225.5 lb

## 2014-07-04 DIAGNOSIS — I6350 Cerebral infarction due to unspecified occlusion or stenosis of unspecified cerebral artery: Secondary | ICD-10-CM

## 2014-07-04 DIAGNOSIS — I4891 Unspecified atrial fibrillation: Secondary | ICD-10-CM

## 2014-07-04 LAB — MDC_IDC_ENUM_SESS_TYPE_INCLINIC

## 2014-07-04 MED ORDER — CARVEDILOL 25 MG PO TABS: 25.0000 mg | ORAL_TABLET | Freq: Two times a day (BID) | ORAL | Status: DC

## 2014-07-04 NOTE — Progress Notes (Signed)
      Patient Care Team: [name], MD as PCP - General (Internal Medicine)   HPI  Grant Moreno. is a 73 y.o. male Seen in followup for syncope with Wide QRS Tachycardia felt to be VT , noninducibility at EPS with LV dysfunction; cath normal CA 2/15 It was elected to place an event recorder.  there have been no intercurrent events.  He has tolerated the up titration of his carvedilol  There has been no intercurrent syncope. There've been no episodes of lightheadedness.  Past Medical History  Diagnosis Date  . A-fib   . Syncope   . Bronchitis, chronic     Wife reports that patient gets bronchitis multiple times a year.  . Flu     Documented Flu January 2015 - treated with Tamiflu  . implantable loop recorder 01/31/2014  . Chronic systolic heart failure 02/08/4981  . Nonischemic cardiomyopathy 01/31/2014  . Ventricular tachycardia (paroxysmal) 01/04/2014  . Bronchitis     Past Surgical History  Procedure Laterality Date  . Finger surgery      Noted the only time patient has been admitted overnight.    . Electrophysiology study  01-08-14    negative EPS by Dr Lovena Le following syncopal episode and documented WCT  . Loop recorder implant  01-08-14    MDT LinQ implanted by Dr Lovena Le for syncope and WCT    Current Outpatient Prescriptions  Medication Sig Dispense Refill  . apixaban (ELIQUIS) 5 MG TABS tablet Take 1 tablet (5 mg total) by mouth 2 (two) times daily.  180 tablet  3  . carvedilol (COREG) 12.5 MG tablet Take 25 mg by mouth 2 (two) times daily. Take as directed      . cholecalciferol (VITAMIN D) 1000 UNITS tablet Take 1,000 Units by mouth daily.      Marland Kitchen losartan (COZAAR) 25 MG tablet Take 1 tablet (25 mg total) by mouth at bedtime.  90 tablet  3  . timolol (TIMOPTIC) 0.5 % ophthalmic solution Place 1 drop into the right eye at bedtime.      . vitamin B-12 (CYANOCOBALAMIN) 1000 MCG tablet Take 1,000 mcg by mouth daily.       No current facility-administered  medications for this visit.    No Known Allergies  Review of Systems negative except from HPI and PMH  Physical Exam Ht 6\' 4"  (1.93 m)  Wt 225 lb 8 oz (102.286 kg)  BMI 27.46 kg/m2 Well developed and nourished in no acute distress HENT normal Neck supple with JVP-flat Clear Regular rate and rhythm, no murmurs or gallops Abd-soft with active BS No Clubbing cyanosis edema Skin-warm and dry A & Oriented  Grossly normal sensory and motor function   ECG demonstrates sinus rhythm at 62 PVCs Intervals 16/12/42 Left axis deviation QRS widening without left bundle branch block  deviation Assessment and  Plan  Syncope.  Atrial fibrillation  Wide QRS tachycardia   Congestive heart failure-chronic systolic  Nonischemic cardiomyopathy  Loop recorder  Left axis deviation/IVCD   No interval atrial fibrillation or wide-complex tachycardia. If tolerated up titration of his medications.  I continued to have some anxiety.no ICD given his wide complex tachycardia  We'll continue to follow closely.

## 2014-07-04 NOTE — Patient Instructions (Signed)
Your physician has recommended you make the following change in your medication:  Increase Coreg to 25 mg twice a day   Your physician wants you to follow-up in: In February with Dr. Caryl Comes. You will receive a reminder letter in the mail two months in advance. If you don't receive a letter, please call our office to schedule the follow-up appointment.

## 2014-07-04 NOTE — Telephone Encounter (Signed)
Pt has some questions regarding his visit today.

## 2014-07-04 NOTE — Addendum Note (Signed)
Addended by: Tracie Harrier on: 07/04/2014 10:55 AM   Modules accepted: Orders, Medications

## 2014-07-04 NOTE — Telephone Encounter (Signed)
Reviewed with patient  Patient verbalized understanding

## 2014-07-06 ENCOUNTER — Telehealth: Payer: Self-pay

## 2014-07-06 NOTE — Telephone Encounter (Signed)
Reviewed AVS with patient.  Patient verbalized understanding.

## 2014-07-06 NOTE — Telephone Encounter (Signed)
Pt has question regarding his visit with Dr. Caryl Comes on Tuesday

## 2014-07-06 NOTE — Telephone Encounter (Signed)
LVM 8/20

## 2014-07-07 LAB — MDC_IDC_ENUM_SESS_TYPE_REMOTE

## 2014-07-10 ENCOUNTER — Ambulatory Visit (INDEPENDENT_AMBULATORY_CARE_PROVIDER_SITE_OTHER): Payer: Medicare Other | Admitting: *Deleted

## 2014-07-10 DIAGNOSIS — R55 Syncope and collapse: Secondary | ICD-10-CM

## 2014-07-10 LAB — MDC_IDC_ENUM_SESS_TYPE_REMOTE

## 2014-07-20 NOTE — Progress Notes (Signed)
Loop recorder 

## 2014-07-21 LAB — MDC_IDC_ENUM_SESS_TYPE_REMOTE

## 2014-07-25 ENCOUNTER — Encounter: Payer: Self-pay | Admitting: Internal Medicine

## 2014-07-25 LAB — MDC_IDC_ENUM_SESS_TYPE_REMOTE

## 2014-07-27 ENCOUNTER — Encounter: Payer: Self-pay | Admitting: Internal Medicine

## 2014-08-11 ENCOUNTER — Ambulatory Visit (INDEPENDENT_AMBULATORY_CARE_PROVIDER_SITE_OTHER): Payer: Medicare Other | Admitting: *Deleted

## 2014-08-11 DIAGNOSIS — R55 Syncope and collapse: Secondary | ICD-10-CM

## 2014-08-25 LAB — MDC_IDC_ENUM_SESS_TYPE_REMOTE

## 2014-08-25 NOTE — Progress Notes (Signed)
Loop recorder 

## 2014-09-04 ENCOUNTER — Encounter: Payer: Self-pay | Admitting: Internal Medicine

## 2014-09-05 ENCOUNTER — Encounter: Payer: Self-pay | Admitting: Internal Medicine

## 2014-09-11 ENCOUNTER — Ambulatory Visit (INDEPENDENT_AMBULATORY_CARE_PROVIDER_SITE_OTHER): Payer: Medicare Other | Admitting: *Deleted

## 2014-09-11 DIAGNOSIS — R55 Syncope and collapse: Secondary | ICD-10-CM

## 2014-09-15 NOTE — Progress Notes (Signed)
Loop recorder 

## 2014-09-21 LAB — MDC_IDC_ENUM_SESS_TYPE_REMOTE

## 2014-09-26 ENCOUNTER — Encounter: Payer: Self-pay | Admitting: Internal Medicine

## 2014-10-09 ENCOUNTER — Telehealth: Payer: Self-pay

## 2014-10-09 NOTE — Telephone Encounter (Signed)
Pt would like a letter from Dr. Caryl Comes states he is unable to fly. Please call.

## 2014-10-10 ENCOUNTER — Ambulatory Visit (INDEPENDENT_AMBULATORY_CARE_PROVIDER_SITE_OTHER): Payer: Medicare Other | Admitting: *Deleted

## 2014-10-10 DIAGNOSIS — R55 Syncope and collapse: Secondary | ICD-10-CM

## 2014-10-10 NOTE — Telephone Encounter (Signed)
Pt needs a letter with DX on it about his [employer] he has in him. Please call patient when letter is done.

## 2014-10-10 NOTE — Telephone Encounter (Signed)
Patient called and asked if he can get a letter stating that he can not fly because of the stress and anxiety it will cause on his heart  He is trying to cancel flight plans made because he has been having episodes and does not feel safe  The airline needs his diagnosis and device type  He wants to know if Dr. Caryl Comes can do this

## 2014-10-18 NOTE — Progress Notes (Signed)
Loop recorder 

## 2014-10-23 ENCOUNTER — Encounter: Payer: Self-pay | Admitting: *Deleted

## 2014-10-23 ENCOUNTER — Telehealth: Payer: Self-pay | Admitting: Internal Medicine

## 2014-10-23 ENCOUNTER — Telehealth: Payer: Self-pay | Admitting: *Deleted

## 2014-10-23 NOTE — Telephone Encounter (Signed)
Patient called regarding his paperwork. Please call stat. His time on his paperwork running out.  He needs to appeal stat!!!

## 2014-10-23 NOTE — Telephone Encounter (Signed)
Grant Moreno called CHF clinic as he not able to reach the [facility] office Confirmed [facility] is having phone issues and offered to send message to the office for him Pt states he is checking the status for a letter request with documentation as to why he cannot fly/airline refund, the window to return this letter is getting short Please advise and or return patients call as soon as possible

## 2014-10-23 NOTE — Telephone Encounter (Signed)
Pt calling to remind [place] that he needs a letter done by doctor that needs to state he cannot fly. Please call patient.

## 2014-10-23 NOTE — Telephone Encounter (Signed)
Grant Moreno has known history of ventricular tachycardia and currently has a loop recorder in place. Due to recent exacerbation in his cardiac condition, we feel that it is in his best interest not to fly at the present time.   Informed his letter has been drafted. Dr. Caryl Comes will sign it in the morning and it will be ready for pick up.  Patient verbalized understanding.

## 2014-10-24 ENCOUNTER — Telehealth: Payer: Self-pay | Admitting: *Deleted

## 2014-10-24 ENCOUNTER — Encounter: Payer: Self-pay | Admitting: *Deleted

## 2014-10-26 ENCOUNTER — Encounter (HOSPITAL_COMMUNITY): Payer: Self-pay | Admitting: Cardiology

## 2014-10-30 ENCOUNTER — Inpatient Hospital Stay (HOSPITAL_COMMUNITY): Payer: Medicare Other

## 2014-11-09 ENCOUNTER — Ambulatory Visit (INDEPENDENT_AMBULATORY_CARE_PROVIDER_SITE_OTHER): Payer: Medicare Other | Admitting: *Deleted

## 2014-11-09 DIAGNOSIS — R55 Syncope and collapse: Secondary | ICD-10-CM

## 2014-11-09 LAB — MDC_IDC_ENUM_SESS_TYPE_REMOTE

## 2014-11-14 LAB — MDC_IDC_ENUM_SESS_TYPE_REMOTE

## 2014-11-15 ENCOUNTER — Encounter: Payer: Self-pay | Admitting: Internal Medicine

## 2014-11-15 NOTE — Progress Notes (Signed)
Loop recorder 

## 2014-12-08 ENCOUNTER — Ambulatory Visit (INDEPENDENT_AMBULATORY_CARE_PROVIDER_SITE_OTHER): Payer: Medicare Other | Admitting: *Deleted

## 2014-12-08 DIAGNOSIS — R55 Syncope and collapse: Secondary | ICD-10-CM

## 2014-12-08 LAB — MDC_IDC_ENUM_SESS_TYPE_REMOTE

## 2014-12-14 NOTE — Progress Notes (Signed)
Loop recorder 

## 2014-12-15 ENCOUNTER — Encounter: Payer: Self-pay | Admitting: Internal Medicine

## 2014-12-18 ENCOUNTER — Ambulatory Visit: Payer: Medicare Other | Admitting: *Deleted

## 2014-12-18 ENCOUNTER — Telehealth: Payer: Self-pay

## 2014-12-18 VITALS — BP 136/84

## 2014-12-18 DIAGNOSIS — I4891 Unspecified atrial fibrillation: Secondary | ICD-10-CM

## 2014-12-18 NOTE — Telephone Encounter (Signed)
Called patient to discuss his symptoms  Scheduled patient for EKG today

## 2014-12-18 NOTE — Telephone Encounter (Signed)
Pt states on Saturday his heart "got out of rhythm, and went back to normal yesterday afternoon." States that when he stands up he feels dizzy, not every time. A little bit this morning, states his "heart was beating kinda hard". Please call. Pt has appt on 2-9 with Dr. Caryl Comes

## 2014-12-18 NOTE — Progress Notes (Signed)
Patient called and stated he was having episodes of dizzy and weak spells (see phone note 12/18/14) He stated he believed that he is "out of rhythm"  Patient came in for EKG and was found to be in afib with a rate of 95 (reviewed by Dr. Fletcher Anon before dismissing patient)  Blood pressure was 136/84 He denies any symptoms at this time but felt he had an "episode on Saturday"  I contacted device clinic which confirmed that patient had episodes on 12/11/14 and 12/16/14 with elevated rates of afib  She stated she informed Dr. Caryl Comes of this   Discussed patient with Dr. Fletcher Anon  Will discuss patient issues with Dr. Caryl Comes 12/19/14

## 2014-12-19 NOTE — Telephone Encounter (Signed)
Dr. Caryl Comes reviewed EKG  Instructed patient to keep his follow up appt next week to discuss possible cardioversion  Instructed patient to contact EMS or go the ED if he has another episode and feels his situation is emergent  Patient verbalized understanding and stated he id feeling much better today and will call us right away if anything changes

## 2014-12-25 ENCOUNTER — Encounter: Payer: Self-pay | Admitting: Internal Medicine

## 2014-12-26 ENCOUNTER — Ambulatory Visit (INDEPENDENT_AMBULATORY_CARE_PROVIDER_SITE_OTHER): Payer: Medicare Other | Admitting: Internal Medicine

## 2014-12-26 ENCOUNTER — Encounter: Payer: Self-pay | Admitting: Internal Medicine

## 2014-12-26 VITALS — BP 110/64 | HR 56 | Ht 76.0 in | Wt 230.0 lb

## 2014-12-26 DIAGNOSIS — I4891 Unspecified atrial fibrillation: Secondary | ICD-10-CM

## 2014-12-26 LAB — MDC_IDC_ENUM_SESS_TYPE_INCLINIC
MDC IDC SESS DTM: 20160209095815
MDC IDC SET ZONE DETECTION INTERVAL: 2000 ms
Zone Setting Detection Interval: 3000 ms
Zone Setting Detection Interval: 380 ms

## 2014-12-26 MED ORDER — MAGNESIUM OXIDE 400 MG PO TABS: 400.0000 mg | ORAL_TABLET | Freq: Every day | ORAL | Status: DC

## 2014-12-26 MED ORDER — CARVEDILOL 25 MG PO TABS: ORAL_TABLET | ORAL | Status: DC

## 2014-12-26 NOTE — Patient Instructions (Signed)
Your physician has recommended you make the following change in your medication:  Change Coreg to 12.5 mg in the am and 25 mg in the PM  Start Magnesium Oxide 400 mg once daily   Your physician recommends that you have labs today:  BMP Magnesium   Your physician wants you to follow-up in: 6 months with Dr. Caryl Comes. You will receive a reminder letter in the mail two months in advance. If you don't receive a letter, please call our office to schedule the follow-up appointment.

## 2014-12-26 NOTE — Progress Notes (Signed)
Patient Care Team: [name], MD as PCP - General (Internal Medicine)   HPI  Grant Devine. is a 74 y.o. male Seen in followup for syncope with Wide QRS Tachycardia felt to be VT , noninducibility at EPS with LV dysfunction; cath normal CA 2/15 It was elected to place an event recorder.  there have been no intercurrent e syncope    He did however have episodes couple of weeks ago of lightheadedness associated with shortness of breath. He had palpitations with them which have persisted. Device interrogation in ECG obtained in the office both confirmed atrial fibrillation with a relatively controlled ventricular response. Palpitations have persisted and are associated with PVCs.  He has tolerated the up titration of his carvedilol more recently has developed lightheadedness in the morning after he took his medications.     Past Medical History  Diagnosis Date  . A-fib   . Syncope   . Bronchitis, chronic     Wife reports that patient gets bronchitis multiple times a year.  . Flu     Documented Flu January 2015 - treated with Tamiflu  . implantable loop recorder 01/31/2014  . Chronic systolic heart failure 12/01/7260  . Nonischemic cardiomyopathy 01/31/2014  . Ventricular tachycardia (paroxysmal) 01/04/2014  . Bronchitis     Past Surgical History  Procedure Laterality Date  . Finger surgery      Noted the only time patient has been admitted overnight.    . Electrophysiology study  01-08-14    negative EPS by Dr Lovena Le following syncopal episode and documented WCT  . Loop recorder implant  01-08-14    MDT LinQ implanted by Dr Lovena Le for syncope and WCT  . Left heart catheterization with coronary angiogram N/A 01/06/2014    Procedure: LEFT HEART CATHETERIZATION WITH CORONARY ANGIOGRAM;  Surgeon: Leonie Man, MD;  Location: [facility] CATH LAB;  Service: Cardiovascular;  Laterality: N/A;  . Electrophysiology study N/A 01/09/2014    Procedure: ELECTROPHYSIOLOGY STUDY +/- ICD,  +/- ILR;  Surgeon: Evans Lance, MD;  Location: [facility] CATH LAB;  Service: Cardiovascular;  Laterality: N/A;    Current Outpatient Prescriptions  Medication Sig Dispense Refill  . apixaban (ELIQUIS) 5 MG TABS tablet Take 1 tablet (5 mg total) by mouth 2 (two) times daily. 180 tablet 3  . carvedilol (COREG) 25 MG tablet Take 1 tablet (25 mg total) by mouth 2 (two) times daily. 180 tablet 3  . losartan (COZAAR) 25 MG tablet Take 1 tablet (25 mg total) by mouth at bedtime. 90 tablet 3  . timolol (TIMOPTIC) 0.5 % ophthalmic solution Place 1 drop into the right eye at bedtime.    . vitamin B-12 (CYANOCOBALAMIN) 1000 MCG tablet Take 1,000 mcg by mouth daily.     No current facility-administered medications for this visit.    No Known Allergies  Review of Systems negative except from HPI and PMH  Physical Exam Ht 6\' 4"  (1.93 m)  Wt 230 lb (104.327 kg)  BMI 28.01 kg/m2 Well developed and nourished in no acute distress HENT normal Neck supple with JVP-flat Clear Regular rate and rhythm, no murmurs or gallops Abd-soft with active BS No Clubbing cyanosis edema Skin-warm and dry A & Oriented  Grossly normal sensory and motor function   ECG demonstrates sinus rhythm at 56 PVCs Intervals 19/12/42 Left axis deviation QRS widening without left bundle branch block  deviation Assessment and  Plan  Syncope.  Atrial fibrillation  Wide QRS tachycardia  Congestive heart failure-chronic systolic  Nonischemic cardiomyopathy  Loop recorder  Left axis deviation/IVCD  Dizziness  PVCs   He continues with symptomatic palpitations. We have gone over the difference between atrial fibrillation and PVCs. We will check a magnesium and metabolic profile as he is having more PVCs. We will begin him on mag oxide. Renal function was normal 4/15.  The atrial fibrillation was self terminating after a couple of days. We have discussed the issue of treatment for atrial fibrillation, specifically  related to antiarrhythmic drugs and catheter ablation. We will however, defer any of these decisions given the infrequency of the event.  He is having a.m. dizziness associated with up titration of his carvedilol. We will decrease his a.m. dose from 25--12.5. He will continue with his ACE inhibitor   We spent more than 50% of our >45 min visit in face to face counseling regarding the above .

## 2014-12-27 LAB — BASIC METABOLIC PANEL
BUN/Creatinine Ratio: 19 (ref 10–22)
BUN: 16 mg/dL (ref 8–27)
CO2: 25 mmol/L (ref 18–29)
CREATININE: 0.85 mg/dL (ref 0.76–1.27)
Calcium: 8.7 mg/dL (ref 8.6–10.2)
Chloride: 103 mmol/L (ref 97–108)
GFR calc non Af Amer: 86 mL/min/{1.73_m2} (ref >59)
GFR, EST AFRICAN AMERICAN: 99 mL/min/{1.73_m2} (ref >59)
GLUCOSE: 101 mg/dL — AB (ref 65–99)
Potassium: 4.4 mmol/L (ref 3.5–5.2)
Sodium: 140 mmol/L (ref 134–144)

## 2014-12-27 LAB — MAGNESIUM: Magnesium: 2.2 mg/dL (ref 1.6–2.3)

## 2015-01-04 ENCOUNTER — Telehealth: Payer: Self-pay | Admitting: Internal Medicine

## 2015-01-04 MED ORDER — CARVEDILOL 25 MG PO TABS: ORAL_TABLET | ORAL | Status: DC

## 2015-01-04 MED ORDER — LOSARTAN POTASSIUM 25 MG PO TABS: 25.0000 mg | ORAL_TABLET | Freq: Every day | ORAL | Status: DC

## 2015-01-04 MED ORDER — APIXABAN 5 MG PO TABS: 5.0000 mg | ORAL_TABLET | Freq: Two times a day (BID) | ORAL | Status: DC

## 2015-01-04 NOTE — Telephone Encounter (Signed)
Patient needs 90 day refills sent to his pharmacy  Medications refilled as requested

## 2015-01-04 NOTE — Telephone Encounter (Signed)
Patient wants to talk to [facility] about Prescription issues.  Patient transferred to voicemail for nurse.

## 2015-01-08 ENCOUNTER — Ambulatory Visit (INDEPENDENT_AMBULATORY_CARE_PROVIDER_SITE_OTHER): Payer: Medicare Other | Admitting: *Deleted

## 2015-01-08 DIAGNOSIS — R55 Syncope and collapse: Secondary | ICD-10-CM

## 2015-01-09 NOTE — Progress Notes (Signed)
Loop recorder 

## 2015-01-24 LAB — MDC_IDC_ENUM_SESS_TYPE_REMOTE: Date Time Interrogation Session: 20160222050500

## 2015-01-31 ENCOUNTER — Encounter: Payer: Self-pay | Admitting: Internal Medicine

## 2015-02-02 ENCOUNTER — Encounter: Payer: Self-pay | Admitting: Internal Medicine

## 2015-02-08 ENCOUNTER — Ambulatory Visit (INDEPENDENT_AMBULATORY_CARE_PROVIDER_SITE_OTHER): Payer: Medicare Other | Admitting: *Deleted

## 2015-02-08 DIAGNOSIS — R55 Syncope and collapse: Secondary | ICD-10-CM

## 2015-02-08 LAB — MDC_IDC_ENUM_SESS_TYPE_REMOTE: MDC IDC SESS DTM: 20160315040500

## 2015-02-13 ENCOUNTER — Encounter: Payer: Self-pay | Admitting: Internal Medicine

## 2015-02-13 NOTE — Progress Notes (Signed)
Loop recorder 

## 2015-02-14 NOTE — Telephone Encounter (Signed)
error 

## 2015-02-19 ENCOUNTER — Encounter: Payer: Self-pay | Admitting: Internal Medicine

## 2015-03-01 ENCOUNTER — Telehealth: Payer: Self-pay | Admitting: *Deleted

## 2015-03-01 NOTE — Telephone Encounter (Signed)
Spoke w/ pt.  He reports that he got a shot in his heel about a month ago and had no problems. He has developed a knot on his ankle of the same foot. It is hard w/ bruising down to his toes. Reports that it has been present for a couple of weeks and does not seem to be getting better. He states that he is concerned about internal bleeding and/or clots. Reassured pt and discussed w/ him the sx of internal bleeding and the purpose of his Eliquis.  He is appreciative and states that he would like for me to make Sherri aware so that she can speak w/ Dr. Caryl Comes when he returns.  Advised him to have his PCP take a look at it if he is concerned, but he states that he is not, that he just wants to make Dr. Caryl Comes aware.

## 2015-03-01 NOTE — Telephone Encounter (Signed)
Pt calling stating he is on eliquis  States that on medication that if you have any bleeding to let your doctor know.  He plasticus, got a shot on heel of ankle of hydrocodone and Has a bruise on left foot, no problem walking or nothing Just asking if we need to lower his dose Please advise.

## 2015-03-01 NOTE — Telephone Encounter (Signed)
Follow up      Pt called stating that he sees Dr Caryl Comes in [place].  He said his nurse in [place] has left and he wanted to leave a msg for Dr Olin Pia nurse in [place].  He is on eliquis.  Pt has a large bruise on his leg/ankle.  He cannot remember hitting it on anything and it is not painful.  He states he is to report any bruising to his physician.  Per Dr Olin Pia nurse here, [place] should handle this call since he is a [place] pt and Dr Caryl Comes is out of the office this week.

## 2015-03-06 NOTE — Telephone Encounter (Signed)
Thanks Grant Moreno Let him know if no better, he should get his PCP to look at the foot to decide what needs to get done next

## 2015-03-09 ENCOUNTER — Ambulatory Visit (INDEPENDENT_AMBULATORY_CARE_PROVIDER_SITE_OTHER): Payer: Medicare Other | Admitting: *Deleted

## 2015-03-09 DIAGNOSIS — R55 Syncope and collapse: Secondary | ICD-10-CM

## 2015-03-13 ENCOUNTER — Encounter: Payer: Medicare Other | Admitting: Internal Medicine

## 2015-03-13 ENCOUNTER — Encounter: Payer: Self-pay | Admitting: *Deleted

## 2015-03-14 ENCOUNTER — Encounter: Payer: Self-pay | Admitting: Internal Medicine

## 2015-03-14 NOTE — Progress Notes (Signed)
Loop recorder 

## 2015-03-15 ENCOUNTER — Telehealth: Payer: Self-pay | Admitting: Internal Medicine

## 2015-03-15 NOTE — Telephone Encounter (Signed)
Patient missed appt. He was not aware was scheduled.  Kristen in [place] scheduled appt. And patient was out of town did not get notification.  Per patient he thinks he may need to be seen sooner rather than later as his rhythm out of wack and he is slightly SOB.    Should patient come in asap?

## 2015-03-19 NOTE — Telephone Encounter (Signed)
Pt is scheduled to come into the office tomorrow in [place] for an appt. Spoke to pt to make sure that he was aware. He stated that he knows about the appt and plans to keep it.

## 2015-03-20 ENCOUNTER — Encounter: Payer: Self-pay | Admitting: Internal Medicine

## 2015-03-20 ENCOUNTER — Ambulatory Visit (INDEPENDENT_AMBULATORY_CARE_PROVIDER_SITE_OTHER): Payer: Medicare Other | Admitting: Internal Medicine

## 2015-03-20 VITALS — BP 112/72 | HR 57 | Ht 76.0 in | Wt 221.5 lb

## 2015-03-20 DIAGNOSIS — I4891 Unspecified atrial fibrillation: Secondary | ICD-10-CM

## 2015-03-20 DIAGNOSIS — I509 Heart failure, unspecified: Secondary | ICD-10-CM | POA: Diagnosis not present

## 2015-03-20 LAB — CUP PACEART INCLINIC DEVICE CHECK
Date Time Interrogation Session: 20160503161358
MDC IDC SET ZONE DETECTION INTERVAL: 3000 ms
MDC IDC SET ZONE DETECTION INTERVAL: 380 ms
Zone Setting Detection Interval: 2000 ms

## 2015-03-20 NOTE — Progress Notes (Signed)
Patient Care Team: [name], MD as PCP - General (Internal Medicine)   HPI  Grant Pullin. is a 74 y.o. male Seen in followup for syncope with Wide QRS Tachycardia felt to be VT , noninducibility at EPS with LV dysfunction; cath normal CA 2/15 It was elected to place an event recorder.  there have been no intercurrent e syncope    He did however have episodes couple of weeks ago of lightheadedness associated with shortness of breath. He had palpitations with them which have persisted. Device interrogation in ECG obtained in the office both confirmed atrial fibrillation with a relatively controlled ventricular response. Palpitations have persisted and are associated with PVCs.  He is struggled immensely with morning fatigue and weakness. We had tried to move his ARB from morning to evening this made this worse. He felt better by the end of the day. They do not have a blood pressure machine at home.  Echocardiogram 2/15 and ejection fraction of 35-40%  Past Medical History  Diagnosis Date  . A-fib   . Syncope   . Bronchitis, chronic     Wife reports that patient gets bronchitis multiple times a year.  . Flu     Documented Flu January 2015 - treated with Tamiflu  . implantable loop recorder 01/31/2014  . Chronic systolic heart failure 11/17/7508  . Nonischemic cardiomyopathy 01/31/2014  . Ventricular tachycardia (paroxysmal) 01/04/2014  . Bronchitis     Past Surgical History  Procedure Laterality Date  . Finger surgery      Noted the only time patient has been admitted overnight.    . Electrophysiology study  01-08-14    negative EPS by Dr Lovena Le following syncopal episode and documented WCT  . Loop recorder implant  01-08-14    MDT LinQ implanted by Dr Lovena Le for syncope and WCT  . Left heart catheterization with coronary angiogram N/A 01/06/2014    Procedure: LEFT HEART CATHETERIZATION WITH CORONARY ANGIOGRAM;  Surgeon: Leonie Man, MD;  Location: [facility] CATH LAB;   Service: Cardiovascular;  Laterality: N/A;  . Electrophysiology study N/A 01/09/2014    Procedure: ELECTROPHYSIOLOGY STUDY +/- ICD, +/- ILR;  Surgeon: Evans Lance, MD;  Location: [facility] CATH LAB;  Service: Cardiovascular;  Laterality: N/A;    Current Outpatient Prescriptions  Medication Sig Dispense Refill  . apixaban (ELIQUIS) 5 MG TABS tablet Take 1 tablet (5 mg total) by mouth 2 (two) times daily. 180 tablet 3  . carvedilol (COREG) 25 MG tablet Take 12.5 mg (1/2 tablet) in the morning and 25 mg (1 tablet) at night 180 tablet 3  . losartan (COZAAR) 25 MG tablet Take 1 tablet (25 mg total) by mouth at bedtime. 90 tablet 3  . magnesium oxide (MAG-OX) 400 MG tablet Take 1 tablet (400 mg total) by mouth daily. 30 tablet 6  . timolol (TIMOPTIC) 0.5 % ophthalmic solution Place 1 drop into the right eye at bedtime.    . vitamin B-12 (CYANOCOBALAMIN) 1000 MCG tablet Take 1,000 mcg by mouth daily.     No current facility-administered medications for this visit.    No Known Allergies  Review of Systems negative except from HPI and PMH  Physical Exam BP 112/72 mmHg  Pulse 57  Ht 6\' 4"  (1.93 m)  Wt 221 lb 8 oz (100.472 kg)  BMI 26.97 kg/m2 Well developed and nourished in no acute distress HENT normal Neck supple with JVP-flat Clear Regular rate and rhythm, no murmurs or gallops  Abd-soft with active BS No Clubbing cyanosis edema Skin-warm and dry A & Oriented  Grossly normal sensory and motor function   ECG demonstrates sinus rhythm at 56 PVCs Intervals 19/12/42 Left axis deviation QRS widening without left bundle branch block  deviation Assessment and  Plan  Syncope.  Fatigue/lightheadedness  Atrial fibrillation/RVR  Sinus bradycardia  Wide QRS tachycardia  Congestive heart failure-chronic systolic  Nonischemic cardiomyopathy  Loop recorder The patient's device was interrogated.  The information was reviewed. No changes were made in the programming.      He has had  intermittent atrial fibrillation with a rapid rate. These episodes are relatively infrequent. He has sinus bradycardia precludes up titration of his beta blocker. Some of his symptoms of fatigue. Also be related to bradycardia and hypotension which makes me reluctant to up titrate his beta blocker.  Because of the fatigue and the issues of low blood pressure, we will decrease his losartan from 25--12.5 and hasn't taken again in the mornings and we'll decrease his carvedilol. Dose from 25--12.5. I've asked that he get a blood pressure cuff.  We will reevaluate his LV function by echo    We spent more than 50% of our >45 min visit in face to face counseling regarding the above .

## 2015-03-20 NOTE — Patient Instructions (Signed)
Medication Instructions:  Decrease losartan to 12.5mg  (1/2 tablet) at bedtime Decrease coreg to 12.5 (1/2 tablet)  Twice daily  Labwork: None  Testing/Procedures: Your physician has requested that you have an echocardiogram. Echocardiography is a painless test that uses sound waves to create images of your heart. It provides your doctor with information about the size and shape of your heart and how well your heart's chambers and valves are working. This procedure takes approximately one hour. There are no restrictions for this procedure.    Follow-Up: Your physician recommends that you schedule a follow-up appointment in: 4 months with Dr. Caryl Comes   Any Other Special Instructions Will Be Listed Below (If Applicable).

## 2015-03-27 ENCOUNTER — Encounter: Payer: Self-pay | Admitting: Internal Medicine

## 2015-04-05 LAB — CUP PACEART REMOTE DEVICE CHECK: Date Time Interrogation Session: 20160414040500

## 2015-04-06 ENCOUNTER — Telehealth: Payer: Self-pay | Admitting: Internal Medicine

## 2015-04-06 ENCOUNTER — Ambulatory Visit (INDEPENDENT_AMBULATORY_CARE_PROVIDER_SITE_OTHER): Payer: Medicare Other

## 2015-04-06 ENCOUNTER — Other Ambulatory Visit: Payer: Self-pay

## 2015-04-06 DIAGNOSIS — I509 Heart failure, unspecified: Secondary | ICD-10-CM

## 2015-04-06 NOTE — Telephone Encounter (Signed)
Patient is in office today for echo.  If we need to get  In touch with him today call  Number in [facility] [phone].  Patient will be in [place] on Monday, please call [phone].

## 2015-04-09 ENCOUNTER — Ambulatory Visit (INDEPENDENT_AMBULATORY_CARE_PROVIDER_SITE_OTHER): Payer: Medicare Other | Admitting: *Deleted

## 2015-04-09 DIAGNOSIS — R55 Syncope and collapse: Secondary | ICD-10-CM | POA: Diagnosis not present

## 2015-04-10 NOTE — Progress Notes (Signed)
Loop recorder 

## 2015-04-11 ENCOUNTER — Encounter: Payer: Self-pay | Admitting: Internal Medicine

## 2015-04-18 ENCOUNTER — Telehealth: Payer: Self-pay | Admitting: Internal Medicine

## 2015-04-18 NOTE — Telephone Encounter (Signed)
S/w pt and notified him that I have sent Dr. Caryl Comes a message regarding his echo results. Pt indicates he will be leaving for [place] 6/3 and may be reached at [phone] but would like to hear back before then.  Sent Dr. Caryl Comes a staff message

## 2015-04-18 NOTE — Telephone Encounter (Signed)
Patient wants echo results before leaving to go to [place] on June 3rd

## 2015-04-18 NOTE — Telephone Encounter (Signed)
Pt is a bit upset about that no one has called him about his echo results.  He said to call him on his home phone [phone] and he will not be home from 10-1pm today for he is taking his wife to the doctors After June 3rd we need to only call [phone]  Please call with results when ready, he said that he was told it would not take "weeks" for [place] to read them and give thm to him

## 2015-04-19 NOTE — Telephone Encounter (Signed)
Reviewed results with patient per Dr. Olin Pia instructions via staff message. Pt verbalized understanding of results. Indicates he will be in [place] until 6/21 and left phone number with [place]. Pt had no further questions.

## 2015-04-20 LAB — CUP PACEART REMOTE DEVICE CHECK: MDC IDC SESS DTM: 20160603110247

## 2015-04-25 ENCOUNTER — Telehealth: Payer: Self-pay | Admitting: *Deleted

## 2015-04-25 NOTE — Telephone Encounter (Signed)
Per pt sleeping at time of episode.

## 2015-04-25 NOTE — Telephone Encounter (Signed)
Pause episode recorded on LINQ 04-23-15 around 428 am. [facility] for return call. ? Symptoms.

## 2015-05-09 ENCOUNTER — Encounter: Payer: Self-pay | Admitting: Internal Medicine

## 2015-05-09 ENCOUNTER — Ambulatory Visit (INDEPENDENT_AMBULATORY_CARE_PROVIDER_SITE_OTHER): Payer: Medicare Other | Admitting: *Deleted

## 2015-05-09 DIAGNOSIS — R55 Syncope and collapse: Secondary | ICD-10-CM

## 2015-05-09 NOTE — Progress Notes (Signed)
Loop recorder 

## 2015-05-15 LAB — CUP PACEART REMOTE DEVICE CHECK: Date Time Interrogation Session: 20160621040500

## 2015-05-25 ENCOUNTER — Encounter: Payer: Self-pay | Admitting: Internal Medicine

## 2015-05-31 ENCOUNTER — Encounter: Payer: Self-pay | Admitting: Internal Medicine

## 2015-06-08 ENCOUNTER — Ambulatory Visit (INDEPENDENT_AMBULATORY_CARE_PROVIDER_SITE_OTHER): Payer: Medicare Other | Admitting: *Deleted

## 2015-06-08 DIAGNOSIS — R55 Syncope and collapse: Secondary | ICD-10-CM | POA: Diagnosis not present

## 2015-06-11 ENCOUNTER — Encounter: Payer: Self-pay | Admitting: Internal Medicine

## 2015-06-11 ENCOUNTER — Telehealth: Payer: Self-pay | Admitting: Internal Medicine

## 2015-06-11 NOTE — Telephone Encounter (Signed)
S/w pt who states he has been dizzy past 3-4 weeks but it has resolved. Feels it was related to big steak and potatoes he had at dinner one night. Confirmed medications.  Reports the following BP:  7/17    74/69 HR 92; 80/69 HR 87 . This was the morning after he had steak the night before. Took coreg medications as directed.  7/20   105/64 HR 69  Pt states he has now been feeling fine, not dizzy or lightheaded. Instructed pt to monitor BP daily, more frequently if symptomatic and to hold BP meds if BP low. He is to report to [place] if BP continuously low.  Pt verbalized understanding, appreciated call, no further questions.

## 2015-06-11 NOTE — Telephone Encounter (Signed)
Patient c/o a couple episodes of dizziness and lightheadedness in the past 6-7 weeks.  Patient states he has had 2 episodes and the last one was 2-3 weeks ago.  Patient thinks these episodes may be a result of going out to eat and eating too much ( food was seasoned with Garlic and oils ?salt )  The episodes seemed to relate to going out and would last a couple days after and go away.  Patient states he feels fine now and is walking on treadmill and losing weight.  Patient was told to report any issues he may be having so that's why he is calling.

## 2015-06-12 ENCOUNTER — Encounter: Payer: Self-pay | Admitting: Internal Medicine

## 2015-06-18 ENCOUNTER — Encounter: Payer: Self-pay | Admitting: Internal Medicine

## 2015-07-03 NOTE — Progress Notes (Signed)
Loop recorder 

## 2015-07-09 ENCOUNTER — Ambulatory Visit (INDEPENDENT_AMBULATORY_CARE_PROVIDER_SITE_OTHER): Payer: Medicare Other | Admitting: *Deleted

## 2015-07-09 DIAGNOSIS — R55 Syncope and collapse: Secondary | ICD-10-CM | POA: Diagnosis not present

## 2015-07-11 NOTE — Progress Notes (Signed)
Loop recorder 

## 2015-07-15 ENCOUNTER — Encounter: Payer: Self-pay | Admitting: Internal Medicine

## 2015-07-16 ENCOUNTER — Telehealth: Payer: Self-pay

## 2015-07-16 ENCOUNTER — Encounter: Payer: Self-pay | Admitting: *Deleted

## 2015-07-16 NOTE — Telephone Encounter (Signed)
Forward to Dr Klein 

## 2015-07-16 NOTE — Telephone Encounter (Signed)
Pt called, states last Wednesday, every time he stood up, he got real dizzy. States he stopped the 2 BP meds. States he went to the Dr. Friday for a colonoscopy, states his HR was 40 something.  States they would not schedule his colonoscopy. States a NP with Dr. [employer] office,  spoke with someone at the [address] office, advised him to stop all his meds. States his afib is "going crazy". States yesterday he took his HR yesterday, ranged from 150-50. States this morning it has not has been "as rampid". States he has not taken his BP meds in 48 hours.  States he thinks it is time for a pacemaker. Please call. States he will be at home until 39. After then, he will be at his cell phone. Pt requests a call before 11.

## 2015-07-16 NOTE — Progress Notes (Signed)
Symptom episodes from 07/12/15-07/15/15 and tachy episode from 8/25 printed for review.  ECGs suggest ?SVT and AF, +Eliquis.  Per phone note, patient stopped taking his BP meds due to a HR in the 40s (not seen on transmission) and has felt that his AF is "going crazy".  Monthly summary reports and ROV with SK on 07/24/2015 at 10:00am in [place].

## 2015-07-17 ENCOUNTER — Telehealth: Payer: Self-pay

## 2015-07-17 NOTE — Telephone Encounter (Signed)
Left message for pt at home number that I am awaiting response from Dr. Caryl Comes.

## 2015-07-17 NOTE — Telephone Encounter (Signed)
Patient is concerned that he is still waiting on a call back.  See Previous note  Patient wants to know if he should adjust medication.  PLEASE CALL PATIENT TODAY!!

## 2015-07-17 NOTE — Telephone Encounter (Signed)
S/w pt who states he went for colonoscopy Friday. Heart rate in 40s. Unable to perform procedure. Per pt., was advised by [place] office to stop losartan and coreg.  Has not taken losartan or coreg since Friday. Would like to know if he can restart and if so, same dosage or different. Informed pt that message sent to [facility]. Awaiting review and will call when I hear back from him. Pt verbalized understanding.  Caralyn Guile, RN in [place] requesting Caryl Comes review phone note

## 2015-07-18 ENCOUNTER — Encounter: Payer: Self-pay | Admitting: Internal Medicine

## 2015-07-18 ENCOUNTER — Telehealth: Payer: Self-pay

## 2015-07-18 NOTE — Telephone Encounter (Signed)
Left voice mail message and sent message to Dr. Caryl Comes  Per Dr. Caryl Comes, he has spoken with the patient. No further action needed

## 2015-07-18 NOTE — Telephone Encounter (Signed)
Pt is calling asking if we can call him as soon as we find out from Dr Caryl Comes.  He is hoping someone would call him today.

## 2015-07-18 NOTE — Telephone Encounter (Signed)
Left VM message for Dr. Caryl Comes to contact me regarding pt meds/appt. Called [place] who informed me that he has no openings tomorrow and is not in the office Friday. Pt has appt in [facility] 9/6 Awaiting response from Dr. Caryl Comes

## 2015-07-22 NOTE — Progress Notes (Signed)
Patient Care Team: [name], MD as PCP - General (Internal Medicine)   HPI  Grant Moreno. is a 74 y.o. male Seen in followup for syncope with Wide QRS Tachycardia felt to be VT , noninducibility at EPS with LV dysfunction; cath normal CA 2/15 It was elected to place an event recorder.   He has had problems with Afib and RVR as well as sinus bradycardia. Recent colonoscopy cancelled 2/2 latter    He has had episodes of lightheadedness associated with shortness of breath. He had palpitations with them which have persisted. Device interrogation in ECG obtained in the office both confirmed atrial fibrillation with a relatively controlled ventricular response. Palpitations have persisted and are associated with PVCs.  He had an episode of syncope while in [place]. This occurred in the bathroom. Correlated with a profound episode of bradycardia as detected by his recorder.   following his canceled colonoscopy, his beta blockers and his ARB were held. He feels better in the last 10 days and he has months. He has not had orthostatic lightheadedness. This is otherwise an intermittent problem.  He denies chest pain or shortness of breath.    Echocardiogram 2/15 and ejection fraction of 35-40%  echocardiogram 5/16 demonstrated significant interval improvement with an EF of 55-60%.  Past Medical History  Diagnosis Date  . A-fib   . Syncope   . Bronchitis, chronic     Wife reports that patient gets bronchitis multiple times a year.  . Flu     Documented Flu January 2015 - treated with Tamiflu  . implantable loop recorder 01/31/2014  . Chronic systolic heart failure 4/31/5400  . Nonischemic cardiomyopathy 01/31/2014  . Ventricular tachycardia (paroxysmal) 01/04/2014  . Bronchitis     Past Surgical History  Procedure Laterality Date  . Finger surgery      Noted the only time patient has been admitted overnight.    . Electrophysiology study  01-08-14    negative EPS by Dr  Lovena Le following syncopal episode and documented WCT  . Loop recorder implant  01-08-14    MDT LinQ implanted by Dr Lovena Le for syncope and WCT  . Left heart catheterization with coronary angiogram N/A 01/06/2014    Procedure: LEFT HEART CATHETERIZATION WITH CORONARY ANGIOGRAM;  Surgeon: Leonie Man, MD;  Location: [facility] CATH LAB;  Service: Cardiovascular;  Laterality: N/A;  . Electrophysiology study N/A 01/09/2014    Procedure: ELECTROPHYSIOLOGY STUDY +/- ICD, +/- ILR;  Surgeon: Evans Lance, MD;  Location: [facility] CATH LAB;  Service: Cardiovascular;  Laterality: N/A;    Current Outpatient Prescriptions  Medication Sig Dispense Refill  . apixaban (ELIQUIS) 5 MG TABS tablet Take 1 tablet (5 mg total) by mouth 2 (two) times daily. 180 tablet 3  . carvedilol (COREG) 25 MG tablet Take 12.5 mg (1/2 tablet) in the morning and 12.5 mg (1/2 tablet) at night 180 tablet 3  . losartan (COZAAR) 25 MG tablet Take 0.5 tablets (12.5 mg total) by mouth at bedtime.    . magnesium oxide (MAG-OX) 400 MG tablet Take 1 tablet (400 mg total) by mouth daily. 30 tablet 6  . timolol (TIMOPTIC) 0.5 % ophthalmic solution Place 1 drop into the right eye at bedtime.    . vitamin B-12 (CYANOCOBALAMIN) 1000 MCG tablet Take 1,000 mcg by mouth daily.     No current facility-administered medications for this visit.    No Known Allergies  Review of Systems negative except from HPI and  PMH  Physical Exam BP 110/72 mmHg  Pulse 69  Ht 6\' 4"  (1.93 m)  Wt 202 lb 12 oz (91.967 kg)  BMI 24.69 kg/m2 Well developed and nourished in no acute distress HENT normal Neck supple with JVP-flat Clear Regular rate and rhythm, no murmurs or gallops Abd-soft with active BS No Clubbing cyanosis edema Skin-warm and dry A & Oriented  Grossly normal sensory and motor function   ECG demonstrates sinus rhythm at  57 17/12/41 PVCs right bundle indeterminate axis   Assessment and  Plan  Syncope.  Atrial fibrillation/RVR  Sinus  bradycardia  Wide QRS tachycardia  PVCs  Congestive heart failure-chronic systolic  Nonischemic cardiomyopathy improved  Syncope  Loop recorder The patient's device was interrogated.     Syncope while occurring post micturition was associated with profound bradycardia arrhythmic episode. We will pursue pacing.  He also has atrial fibrillation with a rapid rate control of which he is limited because of the bradycardia. We will plan to bring him in to hospital and start him on dofetilide.  His ejection fraction is significantly improved. His losartan. Begin him on carvedilol 3.125 twice daily. Uses for augmented rate control.  The benefits and risks were reviewed including but not limited to death,  perforation, infection, lead dislodgement and device malfunction.  The patient understands agrees and is willing to proceed.

## 2015-07-24 ENCOUNTER — Ambulatory Visit (INDEPENDENT_AMBULATORY_CARE_PROVIDER_SITE_OTHER): Payer: Medicare Other | Admitting: Internal Medicine

## 2015-07-24 ENCOUNTER — Encounter: Payer: Self-pay | Admitting: Internal Medicine

## 2015-07-24 VITALS — BP 110/72 | HR 69 | Ht 76.0 in | Wt 202.8 lb

## 2015-07-24 DIAGNOSIS — I4891 Unspecified atrial fibrillation: Secondary | ICD-10-CM | POA: Diagnosis not present

## 2015-07-24 DIAGNOSIS — I472 Ventricular tachycardia: Secondary | ICD-10-CM | POA: Diagnosis not present

## 2015-07-24 DIAGNOSIS — I4729 Other ventricular tachycardia: Secondary | ICD-10-CM

## 2015-07-24 MED ORDER — CARVEDILOL 3.125 MG PO TABS: 3.1250 mg | ORAL_TABLET | Freq: Two times a day (BID) | ORAL | Status: DC

## 2015-07-24 NOTE — Patient Instructions (Addendum)
Medication Instructions:  Your physician has recommended you make the following change in your medication:  STOP taking losartan DECREASE coreg 3.125mg  twice per day   Labwork: none  Testing/Procedures: Your physician has recommended you have a pacemaker at [facility], [place] Wednesday, Sept 21  You will check in to the hospital on Monday, September 19 for tikosyn monitoring  Pacemaker Implantation The heart has its own electrical system, or natural pacemaker, to regulate the heartbeat. Sometimes, the natural pacemaker system of the heart fails and causes the heart to beat too slowly. If this happens, a pacemaker can be surgically placed to help the heart beat at a normal or programmed rate. A pacemaker is a small, battery-powered device that is placed under the skin and is programmed to sense your heartbeats. If your heart rate is lower than the programmed rate, the pacemaker will pace your heart. Parts of a pacemaker include:  Wires or leads. The leads are placed in the heart and transmit electricity to the heart. The leads are connected to the pulse generator.  Pulse generator. The pulse generator contains a computer and a memory system. The pulse generator also produces the electrical signal that triggers the heart to beat. A pacemaker may be placed if:  You have a slow heartbeat (bradycardia).  You have fainting (syncope).  Shortness of breath (dyspnea) due to heart problems. LET [facility] CARE PROVIDER KNOW ABOUT:  Any allergies you may have.  All medicines you are taking, including vitamins, herbs, eye drops, creams, and over-the-counter medicines.  Previous problems you or members of your family have had with the use of anesthetics.  Any blood disorders you have.  Previous surgeries you have had.  Medical conditions you have.  Possibility of pregnancy, if this applies. RISKS AND COMPLICATIONS Generally, pacemaker implantation is a  safe procedure. However, problems can occur and include:  Bleeding.  Unable to place the pacemaker under local sedation.  Infection. BEFORE THE PROCEDURE  You will have blood work drawn before the procedure.  Do not use any tobacco products including cigarettes, chewing tobacco, or electronic cigarettes. If you need help quitting, ask your health care provider.  Do not eat or drink anything after midnight on the night before the procedure or as directed by your health care provider.  Ask your health care provider about:  Changing or stopping your regular medicines. This is especially important if you are taking diabetes medicines or blood thinners.  Taking medicines such as aspirin and ibuprofen. These medicines can thin your blood. Do not take these medicines before your procedure if your health care provider asks you not to.  Ask your health care provider if you can take a sip of water with any approved medicines the morning of the procedure. PROCEDURE  The surgery to place a pacemaker is considered a minimally invasive surgical procedure. It is done under a local anesthetic, which is an injection at the incision site that makes the skin numb. You are also given sedation and pain medicine that makes you drowsy during the procedure.   An intravenous line (IV) will be started in your hand or arm so sedation and pain medicine can be given during the pacemaker procedure.  A numbing medicine will be injected into the skin where the pacemaker is to be placed. A small incision will then be made into the skin. The pacemaker is usually placed under the skin near the collarbone.  After the incision has been made,  the leads will be inserted into a large vein and guided into the heart using X-ray.  Using the same incision that was used to place the leads, a small pocket will be created under the skin to hold the pulse generator. The leads will then be connected to the pulse generator.  The  incision site will then be closed. A bandage (dressing) is placed over the pacemaker site. The dressing is removed 24-48 hours afterward. AFTER THE PROCEDURE  You will be taken to a recovery area after the pacemaker implant. Your vital signs such as blood pressure, heart rate, breathing, and oxygen levels will be monitored.  A chest X-ray will be done after the pacemaker has been implanted. This is to make sure the pacemaker and leads are in the correct place. Document Released: 10/24/2002 Document Revised: 03/20/2014 Document Reviewed: 03/09/2012 [facility] Patient Information 2015 [place], [place]. This information is not intended to replace advice given to you by your health care provider. Make sure you discuss any questions you have with your health care provider.  Follow-Up: Your physician recommends that you schedule a follow-up appointment in: 10-14 days after your pacemaker on Sept 21   Any Other Special Instructions Will Be Listed Below (If Applicable).

## 2015-07-30 ENCOUNTER — Telehealth: Payer: Self-pay

## 2015-07-30 ENCOUNTER — Telehealth: Payer: Self-pay | Admitting: Internal Medicine

## 2015-07-30 NOTE — Telephone Encounter (Signed)
Pt c/o Syncope: STAT if syncope occurred within 30 minutes and pt complains of lightheadedness High Priority if episode of passing out, completely, today or in last 24 hours   1. Did you pass out today? No   2. When is the last time you passed out?   07/28/15 at 845 am   3. Has this occurred multiple times?  Only time since last visit but has happened before   4. Did you have any symptoms prior to passing out?   Nausea swimmy headed dizzy hit head on floor when passed out  Bp at time was 69/59 pulse 60     Todays bp 137/70 pulse 66 patient has d/c coreg since passing out and will not resume until advised to do otherwise    Please call patient

## 2015-07-30 NOTE — Telephone Encounter (Signed)
Forward to Klein 

## 2015-07-30 NOTE — Telephone Encounter (Signed)
S/w pt who states Dr. Caryl Comes called him this afternoon  Was told to stop coreg now and Eliquis 48 hours prior to procedure. Pt had no further questions.

## 2015-08-02 ENCOUNTER — Telehealth: Payer: Self-pay | Admitting: *Deleted

## 2015-08-02 NOTE — Telephone Encounter (Signed)
Called patient regarding pause episode seen on LINQ transmission from 07/28/15.  Patient reports he lost consciousness, wife called 911, patient "came to", told his wife he did not need to go to the hospital.  Patient spoke with Dr. Caryl Comes on 07/30/15 about the episode and PPM implant is planned for 08/08/15.  Patient aware to call 911 if symptoms worsen over the weekend, or to call us if he has questions or concerns in the interim; patient voices understanding of all instructions.

## 2015-08-06 ENCOUNTER — Other Ambulatory Visit: Payer: Self-pay

## 2015-08-06 ENCOUNTER — Encounter (HOSPITAL_COMMUNITY): Payer: Self-pay | Admitting: Physician Assistant

## 2015-08-06 ENCOUNTER — Other Ambulatory Visit: Payer: Self-pay | Admitting: Internal Medicine

## 2015-08-06 ENCOUNTER — Inpatient Hospital Stay (HOSPITAL_COMMUNITY)
Admission: RE | Admit: 2015-08-06 | Discharge: 2015-08-09 | DRG: 243 | Disposition: A | Discharge status: Home / Self Care | Payer: Medicare Other | Source: Ambulatory Visit | Attending: Internal Medicine | Admitting: Internal Medicine

## 2015-08-06 DIAGNOSIS — I495 Sick sinus syndrome: Secondary | ICD-10-CM | POA: Diagnosis present

## 2015-08-06 DIAGNOSIS — Z87891 Personal history of nicotine dependence: Secondary | ICD-10-CM

## 2015-08-06 DIAGNOSIS — I472 Ventricular tachycardia, unspecified: Secondary | ICD-10-CM

## 2015-08-06 DIAGNOSIS — I480 Paroxysmal atrial fibrillation: Secondary | ICD-10-CM | POA: Diagnosis present

## 2015-08-06 DIAGNOSIS — I4729 Other ventricular tachycardia: Secondary | ICD-10-CM

## 2015-08-06 DIAGNOSIS — I4891 Unspecified atrial fibrillation: Secondary | ICD-10-CM | POA: Diagnosis present

## 2015-08-06 DIAGNOSIS — Z7901 Long term (current) use of anticoagulants: Secondary | ICD-10-CM

## 2015-08-06 DIAGNOSIS — I481 Persistent atrial fibrillation: Principal | ICD-10-CM

## 2015-08-06 DIAGNOSIS — Z79899 Other long term (current) drug therapy: Secondary | ICD-10-CM | POA: Diagnosis not present

## 2015-08-06 DIAGNOSIS — R55 Syncope and collapse: Secondary | ICD-10-CM | POA: Diagnosis present

## 2015-08-06 DIAGNOSIS — I4819 Other persistent atrial fibrillation: Secondary | ICD-10-CM

## 2015-08-06 DIAGNOSIS — I429 Cardiomyopathy, unspecified: Secondary | ICD-10-CM

## 2015-08-06 DIAGNOSIS — I5022 Chronic systolic (congestive) heart failure: Secondary | ICD-10-CM | POA: Diagnosis present

## 2015-08-06 DIAGNOSIS — Z959 Presence of cardiac and vascular implant and graft, unspecified: Secondary | ICD-10-CM

## 2015-08-06 HISTORY — DX: Cardiac arrhythmia, unspecified: I49.9

## 2015-08-06 LAB — BASIC METABOLIC PANEL
ANION GAP: 4 — AB (ref 5–15)
BUN: 18 mg/dL (ref 6–20)
CALCIUM: 8.4 mg/dL — AB (ref 8.9–10.3)
CHLORIDE: 104 mmol/L (ref 101–111)
CO2: 26 mmol/L (ref 22–32)
CREATININE: 0.79 mg/dL (ref 0.61–1.24)
GFR calc non Af Amer: >60 mL/min (ref >60)
Glucose, Bld: 170 mg/dL — ABNORMAL HIGH (ref 65–99)
Potassium: 4.4 mmol/L (ref 3.5–5.1)
SODIUM: 134 mmol/L — AB (ref 135–145)

## 2015-08-06 LAB — MAGNESIUM: MAGNESIUM: 2.1 mg/dL (ref 1.7–2.4)

## 2015-08-06 MED ORDER — SODIUM CHLORIDE 0.9 % IJ SOLN: 3.0000 mL | Freq: Two times a day (BID) | INTRAMUSCULAR | Status: DC

## 2015-08-06 MED ORDER — SODIUM CHLORIDE 0.9 % IV SOLN: 250.0000 mL | INTRAVENOUS | Status: DC | PRN

## 2015-08-06 MED ORDER — INFLUENZA VAC SPLIT QUAD 0.5 ML IM SUSY: 0.5000 mL | PREFILLED_SYRINGE | INTRAMUSCULAR | Status: DC | PRN

## 2015-08-06 MED ORDER — SODIUM CHLORIDE 0.9 % IJ SOLN: 3.0000 mL | INTRAMUSCULAR | Status: DC | PRN

## 2015-08-06 MED ORDER — APIXABAN 5 MG PO TABS
5.0000 mg | ORAL_TABLET | Freq: Two times a day (BID) | ORAL | Status: DC
  Administered 2015-08-06: 5 mg via ORAL
  Filled 2015-08-06: qty 1

## 2015-08-06 MED ORDER — DOFETILIDE 250 MCG PO CAPS
500.0000 ug | ORAL_CAPSULE | Freq: Two times a day (BID) | ORAL | Status: DC
  Administered 2015-08-06 – 2015-08-09 (×6): 500 ug via ORAL
  Filled 2015-08-06 (×6): qty 2

## 2015-08-06 MED ORDER — SODIUM CHLORIDE 0.9 % IJ SOLN
3.0000 mL | Freq: Two times a day (BID) | INTRAMUSCULAR | Status: DC
  Administered 2015-08-06 – 2015-08-08 (×4): 3 mL via INTRAVENOUS

## 2015-08-06 MED ORDER — DOFETILIDE 125 MCG PO CAPS: 500.0000 ug | ORAL_CAPSULE | Freq: Two times a day (BID) | ORAL | Status: DC

## 2015-08-06 MED ORDER — TIMOLOL MALEATE 0.5 % OP SOLN
1.0000 [drp] | Freq: Every day | OPHTHALMIC | Status: DC
  Administered 2015-08-06 – 2015-08-08 (×3): 1 [drp] via OPHTHALMIC
  Filled 2015-08-06: qty 5

## 2015-08-06 MED ORDER — MAGNESIUM OXIDE 400 (241.3 MG) MG PO TABS
400.0000 mg | ORAL_TABLET | Freq: Every day | ORAL | Status: DC
  Administered 2015-08-07 – 2015-08-09 (×3): 400 mg via ORAL
  Filled 2015-08-06 (×3): qty 1

## 2015-08-06 MED ORDER — VITAMIN B-12 1000 MCG PO TABS
1000.0000 ug | ORAL_TABLET | Freq: Every day | ORAL | Status: DC
  Administered 2015-08-07 – 2015-08-09 (×3): 1000 ug via ORAL
  Filled 2015-08-06 (×3): qty 1

## 2015-08-06 NOTE — Progress Notes (Signed)
First dose of tikosyn given. QTc 432. VSS. Pt is resting, no new complaints. Will continue to monitor.   Janell Quiet, RN

## 2015-08-06 NOTE — H&P (Signed)
History and Physical   Patient ID: Grant Moreno. MRN: [id number], DOB/AGE: 03-04-1941 74 y.o. Date of Encounter: 08/06/2015  Primary Physician: [name]., MD Primary Cardiologist: Dr Caryl Comes  Chief Complaint:  Atrial fibrillation  HPI: Grant Moreno. is a 74 y.o. male with a history of syncope.   He had syncope with Wide QRS Tachycardia felt to be VT, noninducibility at EPS with LV dysfunction; cath normal Cors 12/2013. On an event recorder, he had problems with Afib and RVR as well as sinus bradycardia. Colonoscopy cancelled 2/2 latter.    He has had episodes of lightheadedness associated with shortness of breath. He had palpitations with them which have persisted. Device interrogation confirmed atrial fibrillation with a relatively controlled ventricular response. Palpitations have persisted and are associated with PVCs.  He had an episode of syncope while in [place]. This occurred with micturition and correlated with a profound episode of bradycardia as detected by his recorder.  Following his canceled colonoscopy, his beta blockers and his ARB were held. He feels better in the last 10 days than he had in months. He has not had orthostatic lightheadedness. This is otherwise an intermittent problem.  He denies chest pain or shortness of breath.  Echocardiogram 12/2013 showed ejection fraction of 35-40%,echocardiogram 03/2015 demonstrated significant interval improvement with an EF of 55-60%.  Seen by Dr Caryl Comes 07/22/2015 and plans made for admission with Tikosyn initiation. Pacemaker insertion is also planned, scheduled for 09/21.   [name] has been maintaining SR by symptoms. No palpitations, presyncope or syncope. No chest pain or SOB. Feels better off BB/ARB.  No bleeding issues, he stopped the Eliquis 09/17, there was some confusion re: 48 hours pre-PPM vs 48 hours pre-admission.   Past Medical History  Diagnosis Date  . A-fib   . Syncope   . Bronchitis,  chronic     Wife reports that patient gets bronchitis multiple times a year.  . Flu     Documented Flu January 2015 - treated with Tamiflu  . implantable loop recorder 01/31/2014  . Chronic systolic heart failure 4/62/7035  . Nonischemic cardiomyopathy 01/31/2014  . Ventricular tachycardia (paroxysmal) 01/04/2014  . Bronchitis     Surgical History:  Past Surgical History  Procedure Laterality Date  . Finger surgery      Noted the only time patient has been admitted overnight.    . Electrophysiology study  01-08-14    negative EPS by Dr Lovena Le following syncopal episode and documented WCT  . Loop recorder implant  01-08-14    MDT LinQ implanted by Dr Lovena Le for syncope and WCT  . Left heart catheterization with coronary angiogram N/A 01/06/2014    Procedure: LEFT HEART CATHETERIZATION WITH CORONARY ANGIOGRAM;  Surgeon: Leonie Man, MD;  Location: [facility] CATH LAB;  Service: Cardiovascular;  Laterality: N/A;  . Electrophysiology study N/A 01/09/2014    Procedure: ELECTROPHYSIOLOGY STUDY +/- ICD, +/- ILR;  Surgeon: Evans Lance, MD;  Location: [facility] CATH LAB;  Service: Cardiovascular;  Laterality: N/A;     I have reviewed the patient's current medications. Prior to Admission medications   Medication Sig Start Date End Date Taking? Authorizing Provider  apixaban (ELIQUIS) 5 MG TABS tablet Take 1 tablet (5 mg total) by mouth 2 (two) times daily. 01/04/15   Wellington Hampshire, MD  carvedilol (COREG) 3.125 MG tablet Take 1 tablet (3.125 mg total) by mouth 2 (two) times daily. 07/24/15   Deboraha Sprang, MD  magnesium oxide (  MAG-OX) 400 MG tablet Take 1 tablet (400 mg total) by mouth daily. 12/26/14   Deboraha Sprang, MD  timolol (TIMOPTIC) 0.5 % ophthalmic solution Place 1 drop into the right eye at bedtime.    Historical Provider, MD  vitamin B-12 (CYANOCOBALAMIN) 1000 MCG tablet Take 1,000 mcg by mouth daily.    Historical Provider, MD   Scheduled Meds: Continuous Infusions: PRN Meds:.  Allergies: No  Known Allergies  Social History   Social History  . Marital Status: Married    Spouse Name: N/A  . Number of Children: N/A  . Years of Education: N/A   Occupational History  . Not on file.   Social History Main Topics  . Smoking status: Former Smoker -- 3.00 packs/day    Types: Cigarettes  . Smokeless tobacco: Former Systems developer    Types: Chew  . Alcohol Use: No  . Drug Use: No  . Sexual Activity: Not on file   Other Topics Concern  . Not on file   Social History Narrative    Family History  Problem Relation Age of Onset  . Family history unknown: Yes   Family Status  Relation Status Death Age  . Mother Deceased   . Father Deceased     Review of Systems:   Full 14-point review of systems otherwise negative except as noted above.  Physical Exam: Blood pressure 145/74, pulse 82, temperature 98 F (36.7 C), temperature source Oral, resp. rate 20, height 6\' 4"  (1.93 m), weight 199 lb 11.8 oz (90.6 kg), SpO2 97 %. General: Well developed, well nourished,male in no acute distress. Head: Normocephalic, atraumatic, sclera non-icteric, no xanthomas, nares are without discharge. Dentition:  Neck: No carotid bruits. JVD not elevated. No thyromegally Lungs: Good expansion bilaterally. without wheezes or rhonchi.  Heart: IRRegular rate and rhythm with S1 S2.  No S3 or S4.  No murmur, no rubs, or gallops appreciated. Abdomen: Soft, non-tender, non-distended with normoactive bowel sounds. No hepatomegaly. No rebound/guarding. No obvious abdominal masses. Msk:  Strength and tone appear normal for age. No joint deformities or effusions, no spine or costo-vertebral angle tenderness. Extremities: No clubbing or cyanosis. No edema.  Distal pedal pulses are 2+ in 4 extrem Neuro: Alert and oriented X 3. Moves all extremities spontaneously. No focal deficits noted. Psych:  Responds to questions appropriately with a normal affect. Skin: No rashes or lesions noted  Labs:   Lab Results   Component Value Date   WBC 9.2 01/07/2014   HGB 12.8* 01/07/2014   HCT 37.1* 01/07/2014   MCV 96.4 01/07/2014   PLT 146* 01/07/2014   No results for input(s): INR in the last 72 hours. No results for input(s): NA, K, CL, CO2, BUN, CREATININE, CALCIUM, PROT, BILITOT, ALKPHOS, ALT, AST, GLUCOSE in the last 168 hours.  Invalid input(s): LABALBU   ECG: Pending  ASSESSMENT AND PLAN:  Principal Problem:   Atrial fibrillation, persistent - currently in SR - for Tikosyn initiation - will write orders, pt wants med at 8a/8p  Active Problems:   Ventricular tachycardia (paroxysmal) - follow on telemetry    Nonischemic cardiomyopathy - EF had improved at last echo    Chronic systolic heart failure - volume status good now - not on diuretic chronically - follow weights - EF now improved    Chronic anticoagulation - Eliquis - Eliquis stopped 09/17, patient confused but in NSR - by symptoms, pt has been in SR the whole time - will start eliquis back and hold prior to  PPM, OK to start Tikosyn.  Grant Speak, PA-C 08/06/2015 11:48 AM Grant Moreno [phone]  EP Attending  Patient seen examined. Agree with the findings as noted by Rosaria Ferries, PA-C. The patient has stopped his Eliquis but is in NSR. He will restart Eliquis and continue his current meds. Note plans for PPM insertion later in the week.  Mikle Bosworth.D.

## 2015-08-07 ENCOUNTER — Ambulatory Visit (HOSPITAL_COMMUNITY): Payer: Medicare Other | Admitting: *Deleted

## 2015-08-07 ENCOUNTER — Other Ambulatory Visit: Payer: Self-pay

## 2015-08-07 ENCOUNTER — Encounter: Payer: Self-pay | Admitting: Internal Medicine

## 2015-08-07 DIAGNOSIS — R55 Syncope and collapse: Secondary | ICD-10-CM

## 2015-08-07 LAB — CUP PACEART REMOTE DEVICE CHECK: MDC IDC SESS DTM: 20160920043613

## 2015-08-07 LAB — BASIC METABOLIC PANEL
Anion gap: 6 (ref 5–15)
BUN: 15 mg/dL (ref 6–20)
CHLORIDE: 102 mmol/L (ref 101–111)
CO2: 27 mmol/L (ref 22–32)
CREATININE: 0.84 mg/dL (ref 0.61–1.24)
Calcium: 8.7 mg/dL — ABNORMAL LOW (ref 8.9–10.3)
GFR calc Af Amer: >60 mL/min (ref >60)
GFR calc non Af Amer: >60 mL/min (ref >60)
Glucose, Bld: 108 mg/dL — ABNORMAL HIGH (ref 65–99)
Potassium: 4.6 mmol/L (ref 3.5–5.1)
Sodium: 135 mmol/L (ref 135–145)

## 2015-08-07 LAB — MAGNESIUM: Magnesium: 2.1 mg/dL (ref 1.7–2.4)

## 2015-08-07 MED ORDER — SODIUM CHLORIDE 0.9 % IR SOLN
80.0000 mg | Status: DC
  Filled 2015-08-07: qty 2

## 2015-08-07 MED ORDER — SODIUM CHLORIDE 0.9 % IV SOLN
INTRAVENOUS | Status: DC
  Administered 2015-08-08: 07:00:00 via INTRAVENOUS

## 2015-08-07 MED ORDER — CHLORHEXIDINE GLUCONATE 4 % EX LIQD
60.0000 mL | Freq: Once | CUTANEOUS | Status: AC
  Administered 2015-08-07: 4 via TOPICAL
  Filled 2015-08-07: qty 15

## 2015-08-07 MED ORDER — CHLORHEXIDINE GLUCONATE 4 % EX LIQD
60.0000 mL | Freq: Once | CUTANEOUS | Status: AC
  Administered 2015-08-08: 4 via TOPICAL
  Filled 2015-08-07: qty 15

## 2015-08-07 MED ORDER — CEFAZOLIN SODIUM-DEXTROSE 2-3 GM-% IV SOLR: 2.0000 g | INTRAVENOUS | Status: DC

## 2015-08-07 NOTE — Progress Notes (Signed)
    SUBJECTIVE: The patient is doing well today.  At this time, he denies chest pain, shortness of breath, or any new concerns.  CURRENT MEDICATIONS: . apixaban  5 mg Oral BID  . dofetilide  500 mcg Oral BID  . magnesium oxide  400 mg Oral Daily  . sodium chloride  3 mL Intravenous Q12H  . timolol  1 drop Right Eye QHS  . vitamin B-12  1,000 mcg Oral Daily      OBJECTIVE: Physical Exam: Filed Vitals:   08/06/15 1008 08/06/15 1345 08/06/15 2127 08/07/15 0500  BP: 145/74 129/75 126/72 121/51  Pulse: 82 62 76 63  Temp: 98 F (36.7 C) 98 F (36.7 C) 97.9 F (36.6 C) 97.6 F (36.4 C)  TempSrc: Oral Oral Oral Oral  Resp: 20 20 18 18   Height: 6\' 4"  (1.93 m)     Weight: 199 lb 11.8 oz (90.6 kg)     SpO2: 97% 96% 99% 99%    Intake/Output Summary (Last 24 hours) at 08/07/15 0834 Last data filed at 08/07/15 0500  Gross per 24 hour  Intake    480 ml  Output   1600 ml  Net  -1120 ml    Telemetry reveals sinus rhythm with multifocal PVC's  GEN- The patient is well appearing, alert and oriented x 3 today.   Head- normocephalic, atraumatic Eyes-  Sclera clear, conjunctiva pink Ears- hearing intact Oropharynx- clear Neck- supple  Lungs- Clear to ausculation bilaterally, normal work of breathing Heart- Regular rate and rhythm, no murmurs, rubs or gallops  GI- soft, NT, ND, + BS Extremities- no clubbing, cyanosis, or edema Skin- no rash or lesion Psych- euthymic mood, full affect Neuro- strength and sensation are intact  LABS: Basic Metabolic Panel:  Recent Labs  08/06/15 1255 08/07/15 0327  NA 134* 135  K 4.4 4.6  CL 104 102  CO2 26 27  GLUCOSE 170* 108*  BUN 18 15  CREATININE 0.79 0.84  CALCIUM 8.4* 8.7*  MG 2.1 2.1    ASSESSMENT AND PLAN:  Principal Problem:   Atrial fibrillation, persistent Active Problems:   Ventricular tachycardia (paroxysmal)   Nonischemic cardiomyopathy   Chronic systolic heart failure   Chronic anticoagulation - Eliquis   PAF  (paroxysmal atrial fibrillation)   1.  Paroxysmal atrial fibrillation Tikosyn started yesterday Continue Eliquis for CHADS2VASC of at least 2 (will hold today for PPM implant tomorrow) BMET, Mg stable QTc stable  2.  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  Prior non-ischemic cardiomyopathy EF 50-55% by most recent echo Euvolemic on exam  4.  PVC's The patient has frequent and multifocal PVC's Would recommend adding BB once PPM implanted - he is concerned about recurrent pre-syncope on BB, will defer decision to Dr Caryl Comes who knows the patient well.  Chanetta Marshall, NP 08/07/2015 8:40 AM  I have seen and examined this patient with Chanetta Marshall.  Agree with above, note added to reflect my findings.  On exam, regular rhythm, no murmurs or wheezing.  Patient admitted with AF and tachy/brady syndrome for tikosyn loading.  QTc minimally prolonged since prior.  Will continue at 500 mg BID and reassess tomorrow.    Will M. Camnitz MD 08/07/2015 2:43 PM

## 2015-08-07 NOTE — Progress Notes (Signed)
Utilization review completed.  

## 2015-08-07 NOTE — Progress Notes (Signed)
Loop recorder 

## 2015-08-07 NOTE — Care Management Note (Signed)
Case Management Note [name] RN, BSN Unit 2W-Case Manager [phone]  Patient Details  Name: Grant Moreno. MRN: [id number] Date of Birth: 10/14/1941  Subjective/Objective:     Pt admitted with afib for Tikosyn load               Action/Plan: PTA pt lived at home with spouse- independent- plan to return home- per pt he is getting ready to go into donut hole with Medicare prescription coverage- per benefit check- Member is going into coverage gap- pt copay will be $209.30-prior auth not required. -- Pt states that he would prefer generic dofetilide and advised pt to speak with MD regarding this which he has- call made to [facility] on [address] [phone])- who states that they have both brand and generic available- pt will need 7 day supply from [facility] main Pharmacy at time of discharge in order for outside pharmacy to have drug in stock for pt. Pt aware of copay cost and coverage - NCM to continue to follow for any further assistance needed.    Expected Discharge Date:                  Expected Discharge Plan:  Home/Self Care  In-House Referral:     Discharge planning Services  CM Consult, Medication Assistance  Post Acute Care Choice:    Choice offered to:     DME Arranged:    DME Agency:     HH Arranged:    [place] Agency:     Status of Service:  Completed, signed off  Medicare Important Message Given:    Date Medicare IM Given:    Medicare IM give by:    Date Additional Medicare IM Given:    Additional Medicare Important Message give by:     If discussed at [place] of Stay Meetings, dates discussed:    Additional Comments:  [employer] prescription member ID- [id number]  Dawayne Patricia, RN 08/07/2015, 4:24 PM

## 2015-08-08 ENCOUNTER — Encounter (HOSPITAL_COMMUNITY): Payer: Self-pay | Admitting: Internal Medicine

## 2015-08-08 ENCOUNTER — Encounter (HOSPITAL_COMMUNITY)
Admission: RE | Disposition: A | Discharge status: Home / Self Care | Payer: Medicare Other | Source: Ambulatory Visit | Attending: Internal Medicine

## 2015-08-08 DIAGNOSIS — Z950 Presence of cardiac pacemaker: Secondary | ICD-10-CM

## 2015-08-08 DIAGNOSIS — I495 Sick sinus syndrome: Secondary | ICD-10-CM

## 2015-08-08 HISTORY — PX: EP IMPLANTABLE DEVICE: SHX172B

## 2015-08-08 HISTORY — DX: Presence of cardiac pacemaker: Z95.0

## 2015-08-08 LAB — BASIC METABOLIC PANEL
ANION GAP: 7 (ref 5–15)
BUN: 14 mg/dL (ref 6–20)
CO2: 28 mmol/L (ref 22–32)
Calcium: 9 mg/dL (ref 8.9–10.3)
Chloride: 105 mmol/L (ref 101–111)
Creatinine, Ser: 0.96 mg/dL (ref 0.61–1.24)
GFR calc Af Amer: >60 mL/min (ref >60)
GLUCOSE: 104 mg/dL — AB (ref 65–99)
POTASSIUM: 4.6 mmol/L (ref 3.5–5.1)
Sodium: 140 mmol/L (ref 135–145)

## 2015-08-08 LAB — CBC
HEMATOCRIT: 40.9 % (ref 39.0–52.0)
HEMOGLOBIN: 13.9 g/dL (ref 13.0–17.0)
MCH: 32.8 pg (ref 26.0–34.0)
MCHC: 34 g/dL (ref 30.0–36.0)
MCV: 96.5 fL (ref 78.0–100.0)
Platelets: 265 10*3/uL (ref 150–400)
RBC: 4.24 MIL/uL (ref 4.22–5.81)
RDW: 12.6 % (ref 11.5–15.5)
WBC: 10.5 10*3/uL (ref 4.0–10.5)

## 2015-08-08 LAB — MAGNESIUM: Magnesium: 2.3 mg/dL (ref 1.7–2.4)

## 2015-08-08 LAB — MRSA PCR SCREENING: MRSA BY PCR: NEGATIVE

## 2015-08-08 SURGERY — PACEMAKER IMPLANT
Anesthesia: LOCAL

## 2015-08-08 MED ORDER — MIDAZOLAM HCL 5 MG/5ML IJ SOLN
INTRAMUSCULAR | Status: DC | PRN
  Administered 2015-08-08 (×3): 2 mg via INTRAVENOUS

## 2015-08-08 MED ORDER — FENTANYL CITRATE (PF) 100 MCG/2ML IJ SOLN
INTRAMUSCULAR | Status: DC | PRN
  Administered 2015-08-08: 25 ug via INTRAVENOUS
  Administered 2015-08-08: 50 ug via INTRAVENOUS
  Administered 2015-08-08: 25 ug via INTRAVENOUS

## 2015-08-08 MED ORDER — SODIUM CHLORIDE 0.9 % IV SOLN: INTRAVENOUS | Status: AC

## 2015-08-08 MED ORDER — SODIUM CHLORIDE 0.9 % IR SOLN
Status: AC
  Filled 2015-08-08: qty 2

## 2015-08-08 MED ORDER — CHLORHEXIDINE GLUCONATE 4 % EX LIQD
CUTANEOUS | Status: AC
  Administered 2015-08-08: 07:00:00
  Filled 2015-08-08: qty 15

## 2015-08-08 MED ORDER — FENTANYL CITRATE (PF) 100 MCG/2ML IJ SOLN
INTRAMUSCULAR | Status: AC
  Filled 2015-08-08: qty 4

## 2015-08-08 MED ORDER — CEFAZOLIN SODIUM-DEXTROSE 2-3 GM-% IV SOLR
2.0000 g | Freq: Four times a day (QID) | INTRAVENOUS | Status: AC
  Administered 2015-08-08 – 2015-08-09 (×3): 2 g via INTRAVENOUS
  Filled 2015-08-08 (×3): qty 50

## 2015-08-08 MED ORDER — LIDOCAINE HCL (PF) 1 % IJ SOLN
INTRAMUSCULAR | Status: DC | PRN
  Administered 2015-08-08: 40 mL via INTRADERMAL

## 2015-08-08 MED ORDER — ONDANSETRON HCL 4 MG/2ML IJ SOLN: 4.0000 mg | Freq: Four times a day (QID) | INTRAMUSCULAR | Status: DC | PRN

## 2015-08-08 MED ORDER — LIDOCAINE HCL (PF) 1 % IJ SOLN
INTRAMUSCULAR | Status: AC
  Filled 2015-08-08: qty 60

## 2015-08-08 MED ORDER — MIDAZOLAM HCL 5 MG/5ML IJ SOLN
INTRAMUSCULAR | Status: AC
  Filled 2015-08-08: qty 25

## 2015-08-08 MED ORDER — ACETAMINOPHEN 325 MG PO TABS: 325.0000 mg | ORAL_TABLET | ORAL | Status: DC | PRN

## 2015-08-08 MED ORDER — CEFAZOLIN SODIUM-DEXTROSE 2-3 GM-% IV SOLR
INTRAVENOUS | Status: DC | PRN
  Administered 2015-08-08: 2 g via INTRAVENOUS

## 2015-08-08 MED ORDER — HEPARIN (PORCINE) IN NACL 2-0.9 UNIT/ML-% IJ SOLN
INTRAMUSCULAR | Status: DC | PRN
  Administered 2015-08-08: 11:00:00

## 2015-08-08 MED ORDER — CEFAZOLIN SODIUM-DEXTROSE 2-3 GM-% IV SOLR
INTRAVENOUS | Status: AC
  Filled 2015-08-08: qty 50

## 2015-08-08 SURGICAL SUPPLY — 7 items
CABLE SURGICAL S-101-97-12 (CABLE) ×2 IMPLANT
LEAD CAPSURE NOVUS 5076-52CM (Lead) ×2 IMPLANT
LEAD CAPSURE NOVUS 5076-58CM (Lead) ×2 IMPLANT
PAD DEFIB LIFELINK (PAD) ×2 IMPLANT
PPM ADVISA MRI DR A2DR01 (Pacemaker) ×2 IMPLANT
SHEATH CLASSIC 7F (SHEATH) ×4 IMPLANT
TRAY PACEMAKER INSERTION (CUSTOM PROCEDURE TRAY) ×2 IMPLANT

## 2015-08-08 NOTE — Interval H&P Note (Signed)
History and Physical Interval Note:  08/08/2015 11:17 AM  Grant Moreno.  has presented today for surgery, with the diagnosis of cardiomyopathy  The various methods of treatment have been discussed with the patient and family. After consideration of risks, benefits and other options for treatment, the patient has consented to  Procedure(s): Pacemaker Implant (N/A) Loop Recorder Removal (N/A) as a surgical intervention .  The patient's history has been reviewed, patient examined, no change in status, stable for surgery.  I have reviewed the patient's chart and labs.  Questions were answered to the patient's satisfaction.     Virl Axe

## 2015-08-08 NOTE — H&P (View-Only) (Signed)
Patient Care Team: [name], MD as PCP - General (Internal Medicine)   HPI  Grant Platz. is a 74 y.o. male Seen in followup for syncope with Wide QRS Tachycardia felt to be VT , noninducibility at EPS with LV dysfunction; cath normal CA 2/15 It was elected to place an event recorder.   He has had problems with Afib and RVR as well as sinus bradycardia. Recent colonoscopy cancelled 2/2 latter    He has had episodes of lightheadedness associated with shortness of breath. He had palpitations with them which have persisted. Device interrogation in ECG obtained in the office both confirmed atrial fibrillation with a relatively controlled ventricular response. Palpitations have persisted and are associated with PVCs.  He had an episode of syncope while in [place]. This occurred in the bathroom. Correlated with a profound episode of bradycardia as detected by his recorder.   following his canceled colonoscopy, his beta blockers and his ARB were held. He feels better in the last 10 days and he has months. He has not had orthostatic lightheadedness. This is otherwise an intermittent problem.  He denies chest pain or shortness of breath.    Echocardiogram 2/15 and ejection fraction of 35-40%  echocardiogram 5/16 demonstrated significant interval improvement with an EF of 55-60%.  Past Medical History  Diagnosis Date  . A-fib   . Syncope   . Bronchitis, chronic     Wife reports that patient gets bronchitis multiple times a year.  . Flu     Documented Flu January 2015 - treated with Tamiflu  . implantable loop recorder 01/31/2014  . Chronic systolic heart failure 05/16/1600  . Nonischemic cardiomyopathy 01/31/2014  . Ventricular tachycardia (paroxysmal) 01/04/2014  . Bronchitis     Past Surgical History  Procedure Laterality Date  . Finger surgery      Noted the only time patient has been admitted overnight.    . Electrophysiology study  01-08-14    negative EPS by Dr  Lovena Le following syncopal episode and documented WCT  . Loop recorder implant  01-08-14    MDT LinQ implanted by Dr Lovena Le for syncope and WCT  . Left heart catheterization with coronary angiogram N/A 01/06/2014    Procedure: LEFT HEART CATHETERIZATION WITH CORONARY ANGIOGRAM;  Surgeon: Leonie Man, MD;  Location: [facility] CATH LAB;  Service: Cardiovascular;  Laterality: N/A;  . Electrophysiology study N/A 01/09/2014    Procedure: ELECTROPHYSIOLOGY STUDY +/- ICD, +/- ILR;  Surgeon: Evans Lance, MD;  Location: [facility] CATH LAB;  Service: Cardiovascular;  Laterality: N/A;    Current Outpatient Prescriptions  Medication Sig Dispense Refill  . apixaban (ELIQUIS) 5 MG TABS tablet Take 1 tablet (5 mg total) by mouth 2 (two) times daily. 180 tablet 3  . carvedilol (COREG) 25 MG tablet Take 12.5 mg (1/2 tablet) in the morning and 12.5 mg (1/2 tablet) at night 180 tablet 3  . losartan (COZAAR) 25 MG tablet Take 0.5 tablets (12.5 mg total) by mouth at bedtime.    . magnesium oxide (MAG-OX) 400 MG tablet Take 1 tablet (400 mg total) by mouth daily. 30 tablet 6  . timolol (TIMOPTIC) 0.5 % ophthalmic solution Place 1 drop into the right eye at bedtime.    . vitamin B-12 (CYANOCOBALAMIN) 1000 MCG tablet Take 1,000 mcg by mouth daily.     No current facility-administered medications for this visit.    No Known Allergies  Review of Systems negative except from HPI and  PMH  Physical Exam BP 110/72 mmHg  Pulse 69  Ht 6\' 4"  (1.93 m)  Wt 202 lb 12 oz (91.967 kg)  BMI 24.69 kg/m2 Well developed and nourished in no acute distress HENT normal Neck supple with JVP-flat Clear Regular rate and rhythm, no murmurs or gallops Abd-soft with active BS No Clubbing cyanosis edema Skin-warm and dry A & Oriented  Grossly normal sensory and motor function   ECG demonstrates sinus rhythm at  57 17/12/41 PVCs right bundle indeterminate axis   Assessment and  Plan  Syncope.  Atrial fibrillation/RVR  Sinus  bradycardia  Wide QRS tachycardia  PVCs  Congestive heart failure-chronic systolic  Nonischemic cardiomyopathy improved  Syncope  Loop recorder The patient's device was interrogated.     Syncope while occurring post micturition was associated with profound bradycardia arrhythmic episode. We will pursue pacing.  He also has atrial fibrillation with a rapid rate control of which he is limited because of the bradycardia. We will plan to bring him in to hospital and start him on dofetilide.  His ejection fraction is significantly improved. His losartan. Begin him on carvedilol 3.125 twice daily. Uses for augmented rate control.  The benefits and risks were reviewed including but not limited to death,  perforation, infection, lead dislodgement and device malfunction.  The patient understands agrees and is willing to proceed.

## 2015-08-09 ENCOUNTER — Inpatient Hospital Stay (HOSPITAL_COMMUNITY): Payer: Medicare Other

## 2015-08-09 DIAGNOSIS — I495 Sick sinus syndrome: Secondary | ICD-10-CM

## 2015-08-09 LAB — BASIC METABOLIC PANEL
Anion gap: 6 (ref 5–15)
BUN: 13 mg/dL (ref 6–20)
CHLORIDE: 101 mmol/L (ref 101–111)
CO2: 28 mmol/L (ref 22–32)
CREATININE: 0.85 mg/dL (ref 0.61–1.24)
Calcium: 8.8 mg/dL — ABNORMAL LOW (ref 8.9–10.3)
GFR calc Af Amer: >60 mL/min (ref >60)
GFR calc non Af Amer: >60 mL/min (ref >60)
GLUCOSE: 107 mg/dL — AB (ref 65–99)
POTASSIUM: 4.2 mmol/L (ref 3.5–5.1)
Sodium: 135 mmol/L (ref 135–145)

## 2015-08-09 LAB — MAGNESIUM: Magnesium: 2.2 mg/dL (ref 1.7–2.4)

## 2015-08-09 MED ORDER — DOFETILIDE 500 MCG PO CAPS: 500.0000 ug | ORAL_CAPSULE | Freq: Two times a day (BID) | ORAL | Status: DC

## 2015-08-09 MED FILL — Sodium Chloride Irrigation Soln 0.9%: Qty: 500 | Status: AC

## 2015-08-09 MED FILL — Gentamicin Sulfate Inj 40 MG/ML: INTRAMUSCULAR | Qty: 2 | Status: AC

## 2015-08-09 NOTE — Progress Notes (Signed)
Pt discharged home.  Alert and oriented x4. No c/ o pain.  Education given on diet, activity, tikosyn, eliquis, other medication, follow-up care and appointments.  Pt and family verbalized understanding.  IV D/cd. Tele D/Cd.  Pt taken home by family.  Pt given 7 day supply of Tikosyn by [facility].

## 2015-08-09 NOTE — Discharge Summary (Signed)
ELECTROPHYSIOLOGY PROCEDURE DISCHARGE SUMMARY    Patient ID: Grant Matlock.,  MRN: [id number], DOB/AGE: Aug 15, 1941 74 y.o.  Admit date: 08/06/2015 Discharge date: 08/09/2015  Primary Care Physician: [name]., MD Electrophysiologist: Caryl Comes  Primary Discharge Diagnosis:  Symptomatic bradycardia status post pacemaker implantation this admission Paroxysmal atrial fibrillation s/p Tikosyn loading this admission  Secondary Discharge Diagnosis:  1.  Syncope 2.  Chronic systolic heart failure 3.  Non ischemic cardiomyopathy 4.  Ventricular tachycardia  No Known Allergies   Procedures This Admission:  1.  Implantation of a MDT dual chamber PPM on 08/08/15 by Dr Caryl Comes.  The patient received a MDT model number Advisa PPM with model number 5076 right atrial lead and 5076 right ventricular lead. There were no immediate post procedure complications. 2.  CXR on 08/09/15 demonstrated no pneumothorax status post device implantation.   Brief HPI: Grant Grainger. is a 74 y.o. male with a past medical history as outlined above.  He has had prior syncope as well as paroxysmal atrial fibrillation and sinus bradycardia.  The patient has had symptomatic bradycardia without reversible causes identified.  Risks, benefits, and alternatives to Tikosyn loading and PPM implantation were reviewed with the patient who wished to proceed.   Hospital Course:  The patient was admitted and underwent Tikosyn loading as well as implantation of a MDT dual chamber pacemaker with details as outlined above.  He  was monitored on telemetry overnight which demonstrated atrial pacing with intrinsic ventricular conduction.  Left chest was without hematoma or ecchymosis.  The device was interrogated and found to be functioning normally.  CXR was obtained and demonstrated no pneumothorax status post device implantation.  Wound care, arm mobility, and restrictions were reviewed with the patient.  The patient was examined  and considered stable for discharge to home.    Physical Exam: Filed Vitals:   08/08/15 1402 08/08/15 1441 08/08/15 2056 08/09/15 0509  BP: 126/100 123/72 127/71 120/71  Pulse:   62 62  Temp:   98.7 F (37.1 C) 98.4 F (36.9 C)  TempSrc:   Oral Oral  Resp:   18 18  Height:      Weight:      SpO2:   98% 96%    Labs:   Lab Results  Component Value Date   WBC 10.5 08/08/2015   HGB 13.9 08/08/2015   HCT 40.9 08/08/2015   MCV 96.5 08/08/2015   PLT 265 08/08/2015     Recent Labs Lab 08/09/15 0540  NA 135  K 4.2  CL 101  CO2 28  BUN 13  CREATININE 0.85  CALCIUM 8.8*  GLUCOSE 107*    Discharge Medications:    Medication List    TAKE these medications        apixaban 5 MG Tabs tablet  Commonly known as:  ELIQUIS  Take 1 tablet (5 mg total) by mouth 2 (two) times daily.     dofetilide 500 MCG capsule  Commonly known as:  TIKOSYN  Take 1 capsule (500 mcg total) by mouth 2 (two) times daily.     magnesium oxide 400 MG tablet  Commonly known as:  MAG-OX  Take 1 tablet (400 mg total) by mouth daily.     timolol 0.5 % ophthalmic solution  Commonly known as:  TIMOPTIC  Place 1 drop into the right eye at bedtime.     vitamin B-12 1000 MCG tablet  Commonly known as:  CYANOCOBALAMIN  Take 1,000 mcg by mouth daily.  Disposition:  Discharge Instructions    Diet - low sodium heart healthy    Complete by:  As directed      Increase activity slowly    Complete by:  As directed           Follow-up Information    Follow up with [facility] On 08/16/2015.   Why:  at 10AM for wound check   Contact information:   [address] [place] [address]       Follow up with [facility] On 08/16/2015.   Why:  at 10:30AM   Contact information:   [place] [address] [place] [address]       Follow up with [facility] On 09/06/2015.   Why:  at [employer] information:   [address] [place] [phone]      Follow up with Virl Axe, MD On 11/14/2015.   Specialty:  Cardiology   Why:  at 8:45AM   Contact information:   [address] [place] [address] [phone]       Duration of Discharge Encounter: Greater than 30 minutes including physician time.  Signed, Chanetta Marshall, NP 08/09/2015 10:38 AM

## 2015-08-09 NOTE — Care Management Important Message (Signed)
Important Message  Patient Details  Name: Grant Moreno. MRN: [id number] Date of Birth: December 22, 1940   Medicare Important Message Given:  Yes-second notification given    Delorse Lek 08/09/2015, 9:15 AM

## 2015-08-09 NOTE — Progress Notes (Signed)
    SUBJECTIVE: The patient is doing well today.  At this time, he denies chest pain, shortness of breath, or any new concerns. Some soreness in shoulder  CURRENT MEDICATIONS: . dofetilide  500 mcg Oral BID  . magnesium oxide  400 mg Oral Daily  . sodium chloride  3 mL Intravenous Q12H  . timolol  1 drop Right Eye QHS  . vitamin B-12  1,000 mcg Oral Daily      OBJECTIVE: Physical Exam: Filed Vitals:   08/08/15 1402 08/08/15 1441 08/08/15 2056 08/09/15 0509  BP: 126/100 123/72 127/71 120/71  Pulse:   62 62  Temp:   98.7 F (37.1 C) 98.4 F (36.9 C)  TempSrc:   Oral Oral  Resp:   18 18  Height:      Weight:      SpO2:   98% 96%    Intake/Output Summary (Last 24 hours) at 08/09/15 1012 Last data filed at 08/08/15 1805  Gross per 24 hour  Intake    752 ml  Output      0 ml  Net    752 ml    Telemetry reveals sinus rhythm with unifocal aectopics with similar initial deflection to sinus and RBBB appearance suspect PACs  GEN- The patient is well appearing, alert and oriented x 3 today.   Head- normocephalic, atraumatic Eyes-  Sclera clear, conjunctiva pink Ears- hearing intact Oropharynx- clear Neck- supple Pocket without hematoma, swelling or tenderness   linq incision ok  Lungs- Clear to ausculation bilaterally, normal work of breathing Heart- Regular rate and rhythm, no murmurs, rubs or gallops  GI- soft, NT, ND, + BS Extremities- no clubbing, cyanosis, or edema Skin- no rash or lesion Psych- euthymic mood, full affect Neuro- strength and sensation are intact  LABS: Basic Metabolic Panel:  Recent Labs  08/08/15 0320 08/09/15 0540  NA 140 135  K 4.6 4.2  CL 105 101  CO2 28 28  GLUCOSE 104* 107*  BUN 14 13  CREATININE 0.96 0.85  CALCIUM 9.0 8.8*  MG 2.3 2.2    ASSESSMENT AND PLAN:  Principal Problem:   Atrial fibrillation, persistent Active Problems:   Ventricular tachycardia (paroxysmal)   Nonischemic cardiomyopathy   Chronic systolic heart  failure   Chronic anticoagulation - Eliquis   PAF (paroxysmal atrial fibrillation)   1.  Paroxysmal atrial fibrillation Tikosyn   Continue Eliquis for CHADS2VASC of at least 2 (will hold today for PPM implant tomorrow) BMET, Mg stable QTc stable  2.  Tachy/brady syndrome/syncope   S/p PM  3.  Prior non-ischemic cardiomyopathy EF 50-55% by most recent echo Euvolemic on exam  4.  Ectopics   Will check device interrogation to see if we can clarify the origin as they may have an impact on treatment b/c secondary effects of LV function   5 SVT  Induced in lab and Rx with overdrive pacing wiil follow along via his device .   Home today Results reviewed BP 120/71 mmHg  Pulse 62  Temp(Src) 98.4 F (36.9 C) (Oral)  Resp 18  Ht 6\' 4"  (1.93 m)  Wt 199 lb 11.8 oz (90.6 kg)  BMI 24.32 kg/m2  SpO2 96% Pocket without hematoma, swelling or tenderness Device function normal CXR stable  lead position

## 2015-08-09 NOTE — Discharge Instructions (Signed)
° ° °  Supplemental Discharge Instructions for  Pacemaker/Defibrillator Patients  Activity No heavy lifting or vigorous activity with your left/right arm for 6 to 8 weeks.  Do not raise your left/right arm above your head for one week.  Gradually raise your affected arm as drawn below.           __       08/13/15                      08/14/15                   08/15/15              08/16/15  NO DRIVING for 1 week       WOUND CARE - Keep the wound area clean and dry.  Do not get this area wet for one week. No showers for one week; you may shower on   08/16/15  . - The tape/steri-strips on your wound will fall off; do not pull them off.  No bandage is needed on the site.  DO  NOT apply any creams, oils, or ointments to the wound area. - If you notice any drainage or discharge from the wound, any swelling or bruising at the site, or you develop a fever > 101? F after you are discharged home, call the office at once.  Special Instructions - You are still able to use cellular telephones; use the ear opposite the side where you have your pacemaker/defibrillator.  Avoid carrying your cellular phone near your device. - When traveling through airports, show security personnel your identification card to avoid being screened in the metal detectors.  Ask the security personnel to use the hand wand. - Avoid arc welding equipment, MRI testing (magnetic resonance imaging), TENS units (transcutaneous nerve stimulators).  Call the office for questions about other devices. - Avoid electrical appliances that are in poor condition or are not properly grounded. - Microwave ovens are safe to be near or to operate.

## 2015-08-10 ENCOUNTER — Encounter: Payer: Self-pay | Admitting: Internal Medicine

## 2015-08-13 LAB — CUP PACEART REMOTE DEVICE CHECK: MDC IDC SESS DTM: 20160821040904

## 2015-08-13 NOTE — Progress Notes (Signed)
Carelink summary report received. Battery status OK. Normal device function. No new symptom, brady, or pause episodes. 1 tachy episode (AF w/RVR) and 6 AF episodes (AT/AF burden 7.6%), +Eliquis,V rate relatively well controlled. Monthly summary reports and ROV with SK in [place] on 07/24/15 at 10:00am.

## 2015-08-16 ENCOUNTER — Ambulatory Visit (INDEPENDENT_AMBULATORY_CARE_PROVIDER_SITE_OTHER): Payer: Medicare Other | Admitting: *Deleted

## 2015-08-16 ENCOUNTER — Ambulatory Visit (INDEPENDENT_AMBULATORY_CARE_PROVIDER_SITE_OTHER): Payer: Medicare Other | Admitting: Pharmacist

## 2015-08-16 DIAGNOSIS — I480 Paroxysmal atrial fibrillation: Secondary | ICD-10-CM

## 2015-08-16 DIAGNOSIS — I472 Ventricular tachycardia: Secondary | ICD-10-CM | POA: Diagnosis not present

## 2015-08-16 DIAGNOSIS — I4729 Other ventricular tachycardia: Secondary | ICD-10-CM

## 2015-08-16 DIAGNOSIS — Z950 Presence of cardiac pacemaker: Secondary | ICD-10-CM

## 2015-08-16 LAB — CUP PACEART INCLINIC DEVICE CHECK
Brady Statistic AP VP Percent: 0.16 %
Brady Statistic AP VS Percent: 23.69 %
Brady Statistic AS VP Percent: 0.2 %
Brady Statistic RA Percent Paced: 23.85 %
Brady Statistic RV Percent Paced: 0.36 %
Date Time Interrogation Session: 20160929102220
Lead Channel Impedance Value: 380 Ohm
Lead Channel Impedance Value: 456 Ohm
Lead Channel Pacing Threshold Amplitude: 0.375 V
Lead Channel Sensing Intrinsic Amplitude: 14.875 mV
Lead Channel Setting Pacing Amplitude: 3.5 V
Lead Channel Setting Pacing Pulse Width: 0.4 ms
Lead Channel Setting Sensing Sensitivity: 2.8 mV
MDC IDC MSMT BATTERY VOLTAGE: 3.12 V
MDC IDC MSMT LEADCHNL RA IMPEDANCE VALUE: 399 Ohm
MDC IDC MSMT LEADCHNL RA IMPEDANCE VALUE: 456 Ohm
MDC IDC MSMT LEADCHNL RA PACING THRESHOLD PULSEWIDTH: 0.4 ms
MDC IDC MSMT LEADCHNL RA SENSING INTR AMPL: 3.5 mV
MDC IDC MSMT LEADCHNL RA SENSING INTR AMPL: 6.25 mV
MDC IDC MSMT LEADCHNL RV PACING THRESHOLD AMPLITUDE: 0.5 V
MDC IDC MSMT LEADCHNL RV PACING THRESHOLD PULSEWIDTH: 0.4 ms
MDC IDC MSMT LEADCHNL RV SENSING INTR AMPL: 10.875 mV
MDC IDC SET LEADCHNL RV PACING AMPLITUDE: 3.5 V
MDC IDC SET ZONE DETECTION INTERVAL: 400 ms
MDC IDC STAT BRADY AS VS PERCENT: 75.95 %
Zone Setting Detection Interval: 400 ms

## 2015-08-16 LAB — MAGNESIUM: Magnesium: 2.1 mg/dL (ref 1.5–2.5)

## 2015-08-16 LAB — BASIC METABOLIC PANEL
BUN: 25 mg/dL — AB (ref 6–23)
CHLORIDE: 104 meq/L (ref 96–112)
CO2: 28 mEq/L (ref 19–32)
Calcium: 8.8 mg/dL (ref 8.4–10.5)
Creatinine, Ser: 0.78 mg/dL (ref 0.40–1.50)
GFR: 103.15 mL/min (ref >60.00)
GLUCOSE: 96 mg/dL (ref 70–99)
POTASSIUM: 4.2 meq/L (ref 3.5–5.1)
SODIUM: 138 meq/L (ref 135–145)

## 2015-08-16 NOTE — Progress Notes (Signed)
    HPI  Mr. Qin is a 74 yo patient of Dr. Caryl Comes who was admitted to the hospital from 9/19-9/22 for symptomatic bradycardia and paroxysmal atrial fibrillation.  He was started on Tikosyn 9/19 for his afib and had a pacemaker placed on 9/21.  Pt was started on 594mcg BID of Tiksoyn.  He was in NSR on admission and remained in NR throughout hospital stay.  QTc was 45msec prior to starting Tikosyn.  At discharge, pt's QTc was 469 msec.  His electrolytes were normal throughout the 3 day course.    Pt is here today with his wife for follow up.  He states he has been feeling well since discharge.  He was able to get his prescription from the pharmacy.  He will be switching to the generic version.  He states he did discuss this with Dr. Caryl Comes and he was okay with this.  He has not missed any doses of Tikosyn.  He is aware to call the office if he misses more than 2 doses in a row.  Discussed potential medication interactions.  Pt usually takes a Zpak several times a year.  Explained that we would not want him to take this and gave him some alternatives for future use.    EKG reviewed by Dr. Lovena Le.  Sinus bradycardia with frequent PVCs.  QTc 486 msec.   Current Outpatient Prescriptions  Medication Sig Dispense Refill  . apixaban (ELIQUIS) 5 MG TABS tablet Take 1 tablet (5 mg total) by mouth 2 (two) times daily. 180 tablet 3  . dofetilide (TIKOSYN) 500 MCG capsule Take 1 capsule (500 mcg total) by mouth 2 (two) times daily. 180 capsule 3  . magnesium oxide (MAG-OX) 400 MG tablet Take 1 tablet (400 mg total) by mouth daily. 30 tablet 6  . timolol (TIMOPTIC) 0.5 % ophthalmic solution Place 1 drop into the right eye at bedtime.    . vitamin B-12 (CYANOCOBALAMIN) 1000 MCG tablet Take 1,000 mcg by mouth daily.     No current facility-administered medications for this visit.   No Known Allergies  Assessment and Plan 1.  Atrial Fibrillation- Pt doing well since hospital discharge.  QTc stable.  Reviewed  important safety concerns with Tikosyn.  Will check K and Mg today.  Pt has a 1 month follow up with Roderic Palau already scheduled.

## 2015-08-16 NOTE — Patient Instructions (Signed)
Antibiotics that are okay to take:  - Amoxicillin - Augmentin - Doxycycline  Antibiotics we want to AVOID:  - Z-pak (azithromycin) - Levaquin  - Cipro

## 2015-08-16 NOTE — Progress Notes (Signed)
Wound check appointment. No steri-strips, dermabond used. Both wounds without redness or edema. Incision edges approximated, wounds well healed. Normal device function. Thresholds, sensing, and impedances consistent with implant measurements. Device programmed at 3.5V/auto capture programmed on for extra safety margin until 3 month visit. Histogram distribution appropriate for patient and level of activity. No mode switches + eliquis. No high ventricular rates noted. Patient educated about wound care, arm mobility, lifting restrictions. ROV w/ SK 01/14/15.

## 2015-08-27 ENCOUNTER — Encounter: Payer: Self-pay | Admitting: Internal Medicine

## 2015-08-28 ENCOUNTER — Encounter: Payer: Self-pay | Admitting: Internal Medicine

## 2015-09-04 NOTE — Progress Notes (Signed)
Carelink summary report received. Battery status OK. Normal device function. 6 new symptom episodes, 2 tachy episodes, 3 brady, 1 pause episodes. 10.6% AF + eliquis. ILR explant & PPM implant scheduled 08/08/15.

## 2015-09-06 ENCOUNTER — Encounter: Payer: Self-pay | Admitting: Internal Medicine

## 2015-09-06 ENCOUNTER — Inpatient Hospital Stay (HOSPITAL_COMMUNITY): Admit: 2015-09-06 | Payer: Medicare Other | Admitting: Nurse Practitioner

## 2015-09-13 ENCOUNTER — Ambulatory Visit (HOSPITAL_COMMUNITY)
Admission: RE | Admit: 2015-09-13 | Discharge: 2015-09-13 | Disposition: A | Discharge status: Home / Self Care | Payer: Medicare Other | Source: Ambulatory Visit | Attending: Nurse Practitioner | Admitting: Nurse Practitioner

## 2015-09-13 ENCOUNTER — Encounter (HOSPITAL_COMMUNITY): Payer: Self-pay | Admitting: Nurse Practitioner

## 2015-09-13 VITALS — BP 122/78 | HR 74 | Ht 76.0 in | Wt 203.2 lb

## 2015-09-13 DIAGNOSIS — R55 Syncope and collapse: Secondary | ICD-10-CM | POA: Insufficient documentation

## 2015-09-13 DIAGNOSIS — I480 Paroxysmal atrial fibrillation: Secondary | ICD-10-CM

## 2015-09-13 DIAGNOSIS — I4891 Unspecified atrial fibrillation: Secondary | ICD-10-CM | POA: Insufficient documentation

## 2015-09-13 LAB — MAGNESIUM: Magnesium: 2.1 mg/dL (ref 1.7–2.4)

## 2015-09-13 LAB — BASIC METABOLIC PANEL
ANION GAP: 7 (ref 5–15)
BUN: 21 mg/dL — AB (ref 6–20)
CO2: 27 mmol/L (ref 22–32)
Calcium: 8.9 mg/dL (ref 8.9–10.3)
Chloride: 104 mmol/L (ref 101–111)
Creatinine, Ser: 0.89 mg/dL (ref 0.61–1.24)
GFR calc Af Amer: >60 mL/min (ref >60)
GLUCOSE: 108 mg/dL — AB (ref 65–99)
POTASSIUM: 4.5 mmol/L (ref 3.5–5.1)
Sodium: 138 mmol/L (ref 135–145)

## 2015-09-13 NOTE — Progress Notes (Signed)
Patient ID: Grant Moreno., male   DOB: Nov 06, 1941, 74 y.o.   MRN: [id number]     Primary Care Physician: [name]., MD Referring Physician: Dr. [name] [name]. is a 74 y.o. male with a h/o afib, syncope, chronic systolic heart failure, nonischemic cardiomyopathy, that is in the afib clinic as f/u from recent PPM and tikosyn. He was seen by Elberta Leatherwood, PharmD, and was doing well in September. He reports no afib. Pacer site well healed, but medial corner mildly pink but does not appear infected, no discharge. Pt thought he saw a loose suture protruding at the corner, but I got a sterile forcep and ran lightly across the area and could not feel anything on the surface of the skin. Pt reassured that site looks good and the mild pinkness should fade with time. Otherwise, he feels well.   Today, he denies symptoms of palpitations, chest pain, shortness of breath, orthopnea, PND, lower extremity edema, dizziness, presyncope, syncope, or neurologic sequela. The patient is tolerating medications without difficulties and is otherwise without complaint today.   Past Medical History  Diagnosis Date  . A-fib ([place])   . Syncope   . Bronchitis, chronic ([facility])     Wife reports that patient gets bronchitis multiple times a year.  . Flu     Documented Flu January 2015 - treated with Tamiflu  . implantable loop recorder 01/31/2014  . Chronic systolic heart failure ([place]) 01/31/2014  . Nonischemic cardiomyopathy ([place]) 01/31/2014  . Ventricular tachycardia (paroxysmal) 01/04/2014  . Bronchitis   . Chronic anticoagulation     Eliquis  . Dysrhythmia    Past Surgical History  Procedure Laterality Date  . Finger surgery      Noted the only time patient has been admitted overnight.    . Electrophysiology study  01-08-14    negative EPS by Dr Lovena Le following syncopal episode and documented WCT  . Loop recorder implant  01-08-14    MDT LinQ implanted by Dr Lovena Le for syncope and WCT  . Left heart  catheterization with coronary angiogram N/A 01/06/2014    Procedure: LEFT HEART CATHETERIZATION WITH CORONARY ANGIOGRAM;  Surgeon: Leonie Man, MD;  Location: [facility] CATH LAB;  Service: Cardiovascular;  Laterality: N/A;  . Electrophysiology study N/A 01/09/2014    Procedure: ELECTROPHYSIOLOGY STUDY +/- ICD, +/- ILR;  Surgeon: Evans Lance, MD;  Location: [facility] C[facility] LAB;  Service: Cardiovascular;  Laterality: N/A;  . Ep implantable device N/A 08/08/2015    Procedure: Pacemaker Implant;  Surgeon: Deboraha Sprang, MD;  Location: [place] CV LAB;  Service: Cardiovascular;  Laterality: N/A;  . Ep implantable device N/A 08/08/2015    Procedure: Loop Recorder Removal;  Surgeon: Deboraha Sprang, MD;  Location: [place] CV LAB;  Service: Cardiovascular;  Laterality: N/A;    Current Outpatient Prescriptions  Medication Sig Dispense Refill  . apixaban (ELIQUIS) 5 MG TABS tablet Take 1 tablet (5 mg total) by mouth 2 (two) times daily. 180 tablet 3  . dofetilide (TIKOSYN) 500 MCG capsule Take 1 capsule (500 mcg total) by mouth 2 (two) times daily. 180 capsule 3  . magnesium oxide (MAG-OX) 400 MG tablet Take 1 tablet (400 mg total) by mouth daily. 30 tablet 6  . timolol (TIMOPTIC) 0.5 % ophthalmic solution Place 1 drop into the right eye at bedtime.    . vitamin B-12 (CYANOCOBALAMIN) 1000 MCG tablet Take 1,000 mcg by mouth daily.     No current facility-administered medications  for this encounter.    No Known Allergies  Social History   Social History  . Marital Status: Married    Spouse Name: N/A  . Number of Children: N/A  . Years of Education: N/A   Occupational History  . Not on file.   Social History Main Topics  . Smoking status: Former Smoker -- 3.00 packs/day    Types: Cigarettes  . Smokeless tobacco: Former Systems developer    Types: Chew  . Alcohol Use: No  . Drug Use: No  . Sexual Activity: No   Other Topics Concern  . Not on file   Social History Narrative    Family History    Problem Relation Age of Onset  . Family history unknown: Yes    ROS- All systems are reviewed and negative except as per the HPI above  Physical Exam: Filed Vitals:   09/13/15 1014  BP: 122/78  Pulse: 74  Height: 6\' 4"  (1.93 m)  Weight: 203 lb 3.2 oz (92.171 kg)    GEN- The patient is well appearing, alert and oriented x 3 today.   Head- normocephalic, atraumatic Eyes-  Sclera clear, conjunctiva pink Ears- hearing intact Oropharynx- clear Neck- supple, no JVP Lymph- no cervical lymphadenopathy Lungs- Clear to ausculation bilaterally, normal work of breathing Heart- Regular rate and rhythm, no murmurs, rubs or gallops, PMI not laterally displaced GI- soft, NT, ND, + BS Extremities- no clubbing, cyanosis, or edema MS- no significant deformity or atrophy Skin- no rash or lesion Psych- euthymic mood, full affect Neuro- strength and sensation are intact  EKG- NSR, LAD, IRBBB, pr int 170 ms, 118 ms, qtc 468 ms Epic records reviewed  Assessment and Plan: 1. Syncope Doing well s/p PPM  2. Afib No further afib with tikosyn loading Bmet/mag today  F/u with Dr. Caryl Comes as scheduled 12/28 afib clinic as needed

## 2015-11-05 ENCOUNTER — Encounter: Payer: Self-pay | Admitting: Internal Medicine

## 2015-11-14 ENCOUNTER — Encounter: Payer: Self-pay | Admitting: Internal Medicine

## 2015-11-14 ENCOUNTER — Ambulatory Visit (INDEPENDENT_AMBULATORY_CARE_PROVIDER_SITE_OTHER): Payer: Medicare Other | Admitting: Internal Medicine

## 2015-11-14 VITALS — BP 118/72 | HR 71 | Ht 76.0 in | Wt 206.0 lb

## 2015-11-14 DIAGNOSIS — R001 Bradycardia, unspecified: Secondary | ICD-10-CM | POA: Diagnosis not present

## 2015-11-14 DIAGNOSIS — Z950 Presence of cardiac pacemaker: Secondary | ICD-10-CM | POA: Insufficient documentation

## 2015-11-14 DIAGNOSIS — I481 Persistent atrial fibrillation: Secondary | ICD-10-CM | POA: Diagnosis not present

## 2015-11-14 DIAGNOSIS — I429 Cardiomyopathy, unspecified: Secondary | ICD-10-CM | POA: Diagnosis not present

## 2015-11-14 DIAGNOSIS — I428 Other cardiomyopathies: Secondary | ICD-10-CM

## 2015-11-14 DIAGNOSIS — I4819 Other persistent atrial fibrillation: Secondary | ICD-10-CM

## 2015-11-14 DIAGNOSIS — Z959 Presence of cardiac and vascular implant and graft, unspecified: Secondary | ICD-10-CM | POA: Diagnosis not present

## 2015-11-14 LAB — CUP PACEART INCLINIC DEVICE CHECK
Battery Remaining Longevity: 133 mo
Brady Statistic AP VP Percent: 0.21 %
Brady Statistic AP VS Percent: 22.85 %
Brady Statistic AS VP Percent: 0.36 %
Brady Statistic RA Percent Paced: 23.06 %
Brady Statistic RV Percent Paced: 0.57 %
Implantable Lead Implant Date: 20160921
Implantable Lead Model: 5076
Lead Channel Impedance Value: 513 Ohm
Lead Channel Impedance Value: 608 Ohm
Lead Channel Pacing Threshold Amplitude: 0.25 V
Lead Channel Pacing Threshold Amplitude: 0.5 V
Lead Channel Pacing Threshold Pulse Width: 0.4 ms
Lead Channel Sensing Intrinsic Amplitude: 15.5 mV
Lead Channel Setting Pacing Amplitude: 2 V
Lead Channel Setting Pacing Pulse Width: 0.4 ms
Lead Channel Setting Sensing Sensitivity: 2.8 mV
MDC IDC LEAD IMPLANT DT: 20160921
MDC IDC LEAD LOCATION: 753859
MDC IDC LEAD LOCATION: 753860
MDC IDC MSMT BATTERY VOLTAGE: 3.06 V
MDC IDC MSMT LEADCHNL RA IMPEDANCE VALUE: 399 Ohm
MDC IDC MSMT LEADCHNL RA IMPEDANCE VALUE: 437 Ohm
MDC IDC MSMT LEADCHNL RA SENSING INTR AMPL: 4.25 mV
MDC IDC MSMT LEADCHNL RV PACING THRESHOLD PULSEWIDTH: 0.4 ms
MDC IDC SESS DTM: 20161228110734
MDC IDC SET LEADCHNL RV PACING AMPLITUDE: 2.5 V
MDC IDC STAT BRADY AS VS PERCENT: 76.58 %

## 2015-11-14 NOTE — Progress Notes (Signed)
Patient Care Team: [name], MD as PCP - General (Internal Medicine)   HPI  Grant Holmen. is a 74 y.o. male Seen in followup for syncope with Wide QRS Tachycardia felt to be VT , noninducibility at EPS with LV dysfunction; cath normal CA 2/15    He has had problems with Afib and RVR as well as sinus bradycardia.   Syncope was associated with profound bradycardia; monitoring demonstrated subsequent control of atrial fibrillation rates  9/16 he was admitted to hospital with implantation of pacemaker initiation of dofetilide. Seen in the A. fib clinic 10/16 point doing well.  He has had no atrial fibrillation of which he can speak no PVCs and exercise tolerance is much improved. He takes his dofetilide without complication and therebleeding issues related to his anticoagulation      Echocardiogram 2/15 and ejection fraction of 35-40%  echocardiogram 5/16 demonstrated significant interval improvement with an EF of 55-60%.  Past Medical History  Diagnosis Date  . A-fib ([place])   . Syncope   . Bronchitis, chronic (HCC)     Wife reports that patient gets bronchitis multiple times a year.  . Flu     Documented Flu January 2015 - treated with Tamiflu  . implantable loop recorder 01/31/2014  . Chronic systolic heart failure ([place]) 01/31/2014  . Nonischemic cardiomyopathy ([place]) 01/31/2014  . Ventricular tachycardia (paroxysmal) 01/04/2014  . Bronchitis   . Chronic anticoagulation     Eliquis  . Dysrhythmia   . Sinus bradycardia 11/14/2015  . Pacemaker- Medtronic 11/14/2015    Past Surgical History  Procedure Laterality Date  . Finger surgery      Noted the only time patient has been admitted overnight.    . Electrophysiology study  01-08-14    negative EPS by Dr Lovena Le following syncopal episode and documented WCT  . Loop recorder implant  01-08-14    MDT LinQ implanted by Dr Lovena Le for syncope and WCT  . Left heart catheterization with coronary angiogram N/A 01/06/2014     Procedure: LEFT HEART CATHETERIZATION WITH CORONARY ANGIOGRAM;  Surgeon: Leonie Man, MD;  Location: [facility] CATH LAB;  Service: Cardiovascular;  Laterality: N/A;  . Electrophysiology study N/A 01/09/2014    Procedure: ELECTROPHYSIOLOGY STUDY +/- ICD, +/- ILR;  Surgeon: Evans Lance, MD;  Location: [facility] CATH LAB;  Service: Cardiovascular;  Laterality: N/A;  . Ep implantable device N/A 08/08/2015    Procedure: Pacemaker Implant;  Surgeon: Deboraha Sprang, MD;  Location: [place] CV LAB;  Service: Cardiovascular;  Laterality: N/A;  . Ep implantable device N/A 08/08/2015    Procedure: Loop Recorder Removal;  Surgeon: Deboraha Sprang, MD;  Location: [place] CV LAB;  Service: Cardiovascular;  Laterality: N/A;    Current Outpatient Prescriptions  Medication Sig Dispense Refill  . apixaban (ELIQUIS) 5 MG TABS tablet Take 1 tablet (5 mg total) by mouth 2 (two) times daily. 180 tablet 3  . dofetilide (TIKOSYN) 500 MCG capsule Take 1 capsule (500 mcg total) by mouth 2 (two) times daily. 180 capsule 3  . magnesium oxide (MAG-OX) 400 MG tablet Take 1 tablet (400 mg total) by mouth daily. 30 tablet 6  . timolol (TIMOPTIC) 0.5 % ophthalmic solution Place 1 drop into the right eye at bedtime.    . vitamin B-12 (CYANOCOBALAMIN) 1000 MCG tablet Take 1,000 mcg by mouth daily.     No current facility-administered medications for this visit.    No Known Allergies  Review of Systems negative except from HPI and PMH  Physical Exam BP 118/72 mmHg  Pulse 71  Ht 6\' 4"  (1.93 m)  Wt 206 lb (93.441 kg)  BMI 25.09 kg/m2 Well developed and nourished in no acute distress HENT normal Neck supple with JVP-flat Clear Regular rate and rhythm, no murmurs or gallops Abd-soft with active BS No Clubbing cyanosis edema Skin-warm and dry A & Oriented  Grossly normal sensory and motor function   ECG demonstrates sinus rhythm at  71 Intervals 17/11/42 Left axis deviation -64   Assessment and   Plan  Syncope.  Atrial fibrillation/RVR  Sinus bradycardia  Wide QRS tachycardia  PVCs  Congestive heart failure-chronic systolic  Nonischemic cardiomyopathy improved  Syncope  Pacemaker-Medtronic  High risk medication-dofetilide   The patient has done beautifully following pacing for bradycardia and dofetilide for PVCs and atrial fibrillation.Atrial fibrillation is essentially not occurred; there are no histograms suggesting significant ventricular ectopy. Exercise tolerance is much improved  We will continue him on his current medications and plan to have him see A S for electrolytes ECG assessment and atrial fibrillation burden evaluation in 3 months  I will see him in 9 months  They had questions regarding "the drug may be deadly "on dofetilide from the package insert. We reviewed proarrhythmia

## 2015-11-14 NOTE — Patient Instructions (Addendum)
Medication Instructions: - no changes  Labwork: - none  Procedures/Testing: - none  Follow-Up: - Your physician recommends that you schedule a follow-up appointment in: 3 months with Chanetta Marshall, NP  - Your physician wants you to follow-up in: 9 months with Dr. Caryl Comes ( September 2017). You will receive a reminder letter in the mail two months in advance. If you don't receive a letter, please call our office to schedule the follow-up appointment.  Any Additional Special Instructions Will Be Listed Below (If Applicable).

## 2016-01-14 ENCOUNTER — Encounter: Payer: Self-pay | Admitting: Nurse Practitioner

## 2016-01-14 NOTE — Progress Notes (Signed)
Electrophysiology Office Note Date: 01/15/2016  ID:  Adom Cabadas., DOB 14-May-1941, MRN [id number]  PCP: [name]., MD Electrophysiologist: Caryl Comes  CC: Pacemaker/atrial fibrillation follow-up  Grant Moreno. is a 75 y.o. male seen today for Dr Caryl Comes.  He presents today for routine electrophysiology followup.  Since last being seen in our clinic, the patient reports doing very well.  He denies chest pain, palpitations, dyspnea, PND, orthopnea, nausea, vomiting, dizziness, syncope, edema, weight gain, or early satiety.  He and his wife are planning a 6 week trip out [place] in June.   Device History: MDT dual chamber PPM implanted 2016 for symptomatic bradycardia   Past Medical History  Diagnosis Date  . Persistent atrial fibrillation ([place])     a. on tikosyn   . Syncope   . Ventricular tachycardia (paroxysmal)   . Sinus bradycardia     a. s/p MDT PPM   Past Surgical History  Procedure Laterality Date  . Finger surgery      Noted the only time patient has been admitted overnight.    . Electrophysiology study  01-08-14    negative EPS by Dr Lovena Le following syncopal episode and documented WCT  . Loop recorder implant  01-08-14    MDT LinQ implanted by Dr Lovena Le for syncope and WCT  . Left heart catheterization with coronary angiogram N/A 01/06/2014    Procedure: LEFT HEART CATHETERIZATION WITH CORONARY ANGIOGRAM;  Surgeon: Leonie Man, MD;  Location: [facility] CATH LAB;  Service: Cardiovascular;  Laterality: N/A;  . Electrophysiology study N/A 01/09/2014    Procedure: ELECTROPHYSIOLOGY STUDY +/- ICD, +/- ILR;  Surgeon: Evans Lance, MD;  Location: [facility] CATH LAB;  Service: Cardiovascular;  Laterality: N/A;  . Ep implantable device N/A 08/08/2015    Procedure: Pacemaker Implant;  Surgeon: Deboraha Sprang, MD;  Location: [place] CV LAB;  Service: Cardiovascular;  Laterality: N/A;  . Ep implantable device N/A 08/08/2015    Procedure: Loop Recorder Removal;  Surgeon: Deboraha Sprang,  MD;  Location: [place] CV LAB;  Service: Cardiovascular;  Laterality: N/A;    Current Outpatient Prescriptions  Medication Sig Dispense Refill  . apixaban (ELIQUIS) 5 MG TABS tablet Take 1 tablet (5 mg total) by mouth 2 (two) times daily. 60 tablet 6  . Cholecalciferol (VITAMIN D3) 5000 units CAPS Take 500 Units by mouth daily.    Marland Kitchen dofetilide (TIKOSYN) 500 MCG capsule Take 1 capsule (500 mcg total) by mouth 2 (two) times daily. 180 capsule 3  . magnesium oxide (MAG-OX) 400 MG tablet Take 1 tablet (400 mg total) by mouth daily. 30 tablet 6  . timolol (TIMOPTIC) 0.5 % ophthalmic solution Place 1 drop into the right eye at bedtime.    . vitamin B-12 (CYANOCOBALAMIN) 1000 MCG tablet Take 1,000 mcg by mouth daily.     No current facility-administered medications for this visit.    Allergies:   Review of patient's allergies indicates no known allergies.   Social History: Social History   Social History  . Marital Status: Married    Spouse Name: N/A  . Number of Children: N/A  . Years of Education: N/A   Occupational History  . Not on file.   Social History Main Topics  . Smoking status: Former Smoker -- 3.00 packs/day    Types: Cigarettes  . Smokeless tobacco: Former Systems developer    Types: Chew  . Alcohol Use: No  . Drug Use: No  . Sexual Activity: No   Other  Topics Concern  . Not on file   Social History Narrative    Family History: Family History  Problem Relation Age of Onset  . Family history unknown: Yes     Review of Systems: All other systems reviewed and are otherwise negative except as noted above.   Physical Exam: VS:  BP 120/70 mmHg  Pulse 62  Ht 6\' 4"  (1.93 m)  Wt 208 lb 3.2 oz (94.439 kg)  BMI 25.35 kg/m2  SpO2 96% , BMI Body mass index is 25.35 kg/(m^2).  GEN- The patient is elderly appearing, alert and oriented x 3 today.   HEENT: normocephalic, atraumatic; sclera clear, conjunctiva pink; hearing intact; oropharynx clear; neck supple  Lungs-  Clear to ausculation bilaterally, normal work of breathing.  No wheezes, rales, rhonchi Heart- Regular rate and rhythm, no murmurs, rubs or gallops  GI- soft, non-tender, non-distended, bowel sounds present  Extremities- no clubbing, cyanosis, or edema; DP/PT/radial pulses 2+ bilaterally MS- no significant deformity or atrophy Skin- warm and dry, no rash or lesion; PPM pocket well healed Psych- euthymic mood, full affect Neuro- strength and sensation are intact  PPM Interrogation- reviewed in detail today,  See PACEART report  EKG:  EKG is ordered today. The ekg ordered today shows atrial pacing with intrinsic ventricular conduction, QTc 425msec  Recent Labs: 08/08/2015: Hemoglobin 13.9; Platelets 265 09/13/2015: BUN 21*; Creatinine, Ser 0.89; Magnesium 2.1; Potassium 4.5; Sodium 138   Wt Readings from Last 3 Encounters:  01/15/16 208 lb 3.2 oz (94.439 kg)  11/14/15 206 lb (93.441 kg)  09/13/15 203 lb 3.2 oz (92.171 kg)     Other studies Reviewed: Additional studies/ records that were reviewed today include:Dr Klein's office notes  Assessment and Plan:  1.  Symptomatic bradycardia Normal PPM function See Pace Art report No changes today  2.  Persistent atrial fibrillation AF burden 0% on device interrogation today Some short runs of AT with 1:1 conduction - asymptomatic  Continue Tikosyn BMET, Mg today QTc stable today  3.  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  NICM Resolved by most recent echo   Current medicines are reviewed at length with the patient today.   The patient does not have concerns regarding his medicines.  The following changes were made today:  none  Labs/ tests ordered today include: BMET, Mg    Disposition:   Follow up with [facility], Dr Caryl Comes in 6 months as scheduled      Signed, Chanetta Marshall, NP 01/15/2016 10:15 AM  [address] [place] [address] [phone] (office) [phone] (fax)

## 2016-01-15 ENCOUNTER — Encounter: Payer: Self-pay | Admitting: Internal Medicine

## 2016-01-15 ENCOUNTER — Encounter: Payer: Self-pay | Admitting: Nurse Practitioner

## 2016-01-15 ENCOUNTER — Ambulatory Visit (INDEPENDENT_AMBULATORY_CARE_PROVIDER_SITE_OTHER): Payer: Medicare Other | Admitting: Nurse Practitioner

## 2016-01-15 VITALS — BP 120/70 | HR 62 | Ht 76.0 in | Wt 208.2 lb

## 2016-01-15 DIAGNOSIS — R001 Bradycardia, unspecified: Secondary | ICD-10-CM

## 2016-01-15 DIAGNOSIS — I481 Persistent atrial fibrillation: Secondary | ICD-10-CM

## 2016-01-15 DIAGNOSIS — I493 Ventricular premature depolarization: Secondary | ICD-10-CM | POA: Diagnosis not present

## 2016-01-15 DIAGNOSIS — I4819 Other persistent atrial fibrillation: Secondary | ICD-10-CM

## 2016-01-15 LAB — BASIC METABOLIC PANEL
BUN: 24 mg/dL (ref 7–25)
CALCIUM: 8.6 mg/dL (ref 8.6–10.3)
CO2: 26 mmol/L (ref 20–31)
CREATININE: 0.88 mg/dL (ref 0.70–1.18)
Chloride: 104 mmol/L (ref 98–110)
Glucose, Bld: 100 mg/dL — ABNORMAL HIGH (ref 65–99)
Potassium: 4.6 mmol/L (ref 3.5–5.3)
Sodium: 137 mmol/L (ref 135–146)

## 2016-01-15 LAB — MAGNESIUM: MAGNESIUM: 2.1 mg/dL (ref 1.5–2.5)

## 2016-01-15 MED ORDER — APIXABAN 5 MG PO TABS: 5.0000 mg | ORAL_TABLET | Freq: Two times a day (BID) | ORAL | Status: DC

## 2016-01-15 NOTE — Patient Instructions (Addendum)
Medication Instructions: Your physician recommends that you continue on your current medications as directed. Please refer to the Current Medication list given to you today.    If you need a refill on your cardiac medications before your next appointment, please call your pharmacy.  Labwork:  BEMT AND MAG    Testing/Procedures:  NONE ORDER TODAY    Follow-Up:  Your physician wants you to follow-up in:  [place] will receive a reminder letter in the mail two months in advance. If you don't receive a letter, please call our office to schedule the follow-up appointment.  Remote monitoring is used to monitor your Pacemaker of ICD from home. This monitoring reduces the number of office visits required to check your device to one time per year. It allows [place] to keep an eye on the functioning of your device to ensure it is working properly. You are scheduled for a device check from home on . 04/14/16..You may send your transmission at any time that day. If you have a wireless device, the transmission will be sent automatically. After your physician reviews your transmission, you will receive a postcard with your next transmission date.      Any Other Special Instructions Will Be Listed Below (If Applicable).

## 2016-01-17 LAB — CUP PACEART INCLINIC DEVICE CHECK
Battery Remaining Longevity: 126 mo
Battery Voltage: 3.04 V
Brady Statistic AP VP Percent: 0.09 %
Brady Statistic AP VS Percent: 29.31 %
Brady Statistic AS VP Percent: 0.05 %
Brady Statistic AS VS Percent: 70.56 %
Implantable Lead Implant Date: 20160921
Implantable Lead Location: 753859
Lead Channel Impedance Value: 399 Ohm
Lead Channel Impedance Value: 399 Ohm
Lead Channel Impedance Value: 437 Ohm
Lead Channel Pacing Threshold Amplitude: 0.25 V
Lead Channel Pacing Threshold Amplitude: 0.5 V
Lead Channel Pacing Threshold Pulse Width: 0.4 ms
Lead Channel Sensing Intrinsic Amplitude: 13.625 mV
Lead Channel Sensing Intrinsic Amplitude: 15.5 mV
Lead Channel Sensing Intrinsic Amplitude: 3.75 mV
Lead Channel Setting Pacing Amplitude: 2 V
MDC IDC LEAD IMPLANT DT: 20160921
MDC IDC LEAD LOCATION: 753860
MDC IDC MSMT LEADCHNL RA PACING THRESHOLD PULSEWIDTH: 0.4 ms
MDC IDC MSMT LEADCHNL RA SENSING INTR AMPL: 6.375 mV
MDC IDC MSMT LEADCHNL RV IMPEDANCE VALUE: 456 Ohm
MDC IDC SESS DTM: 20170228154908
MDC IDC SET LEADCHNL RV PACING AMPLITUDE: 2.5 V
MDC IDC SET LEADCHNL RV PACING PULSEWIDTH: 0.4 ms
MDC IDC SET LEADCHNL RV SENSING SENSITIVITY: 2.8 mV
MDC IDC STAT BRADY RA PERCENT PACED: 29.4 %
MDC IDC STAT BRADY RV PERCENT PACED: 0.13 %

## 2016-01-18 ENCOUNTER — Telehealth: Payer: Self-pay | Admitting: *Deleted

## 2016-01-18 ENCOUNTER — Encounter: Payer: Medicare Other | Admitting: Nurse Practitioner

## 2016-01-18 NOTE — Telephone Encounter (Signed)
-----   Message from Patsey Berthold, NP sent at 01/15/2016  4:07 PM EST ----- Please notify patient of stable labs

## 2016-01-30 ENCOUNTER — Telehealth: Payer: Self-pay

## 2016-01-30 MED ORDER — APIXABAN 5 MG PO TABS: 5.0000 mg | ORAL_TABLET | Freq: Two times a day (BID) | ORAL | Status: DC

## 2016-01-30 MED ORDER — DOFETILIDE 500 MCG PO CAPS: 500.0000 ug | ORAL_CAPSULE | Freq: Two times a day (BID) | ORAL | Status: DC

## 2016-01-30 NOTE — Telephone Encounter (Signed)
Pt called the refill line to request 90 day refill for Eliquis and Tikosyn. Rx sent to pt pharmacy [facility] in [facility]

## 2016-02-01 ENCOUNTER — Telehealth: Payer: Self-pay | Admitting: Internal Medicine

## 2016-02-01 NOTE — Telephone Encounter (Signed)
New message      Pt c/o BP issue: STAT if pt c/o blurred vision, one-sided weakness or slurred speech  1. What are your last 5 BP readings? 81/63 at 10:05 today, HR 105 2. Are you having any other symptoms (ex. Dizziness, headache, blurred vision, passed out)? Dizzy when he stands up 3. What is your BP issue? bp is low, pt thinks he is in AFIB.  Should he send a pacemaker remote transmission?

## 2016-02-01 NOTE — Telephone Encounter (Signed)
Reviewed patient's transmission and confirmed that he is in AF.  Called patient back to make him aware.  He denies missing any doses of medications, including Eliquis and Tikosyn.  Advised patient that I will call him back after discussing with Dr. Caryl Comes.  Reviewed transmission and symptoms with Dr. Caryl Comes.  Per Dr. Caryl Comes, plan for a cardioversion on Monday and have patient send a transmission Monday morning to confirm that patient is still out of rhythm.  Scheduled DCCV for 02/04/16 at 1:00pm with Dr. Stanford Breed.  Called patient to make him aware.  Patient verbalizes understanding of instructions to send remote Monday morning, to avoid eating or drinking on Sunday night after midnight (except for sips of water with meds), and to plan to arrive at the main entrance at [facility] by 11:30am.  Advised patient that I will call him on Monday morning to let him know if he is still out of rhythm.  If he is in rhythm, we will plan to cancel the DCCV.  Advised patient that he may proceed to the ED over the weekend if his symptoms worsen.  Patient verbalizes understanding of all instructions and denies additional questions or concerns.  He is appreciative of call.

## 2016-02-01 NOTE — Telephone Encounter (Signed)
Spoke w/ pt and instructed him to send a manual transmission w/ his home monitor and that we would forward the results of the transmission and the call to triage per device tech RN. Pt verbalized understanding.

## 2016-02-04 ENCOUNTER — Encounter (HOSPITAL_COMMUNITY): Admission: RE | Payer: Self-pay | Source: Ambulatory Visit

## 2016-02-04 ENCOUNTER — Ambulatory Visit (HOSPITAL_COMMUNITY): Admission: RE | Admit: 2016-02-04 | Payer: Medicare Other | Source: Ambulatory Visit | Admitting: Cardiology

## 2016-02-04 SURGERY — CARDIOVERSION
Anesthesia: Monitor Anesthesia Care

## 2016-02-04 NOTE — Telephone Encounter (Signed)
Called scheduling to cancel patient's DCCV, spoke with Chip.

## 2016-02-04 NOTE — Telephone Encounter (Signed)
Called patient to let him know that his transmission from this morning shows that he is back in sinus rhythm.  He states he feels much better and denies any cardiac symptoms at this time.  Patient is aware he no longer has a need for the DCCV scheduled for today.  He is appreciative of our assistance and denies any questions or concerns at this time.

## 2016-04-15 ENCOUNTER — Ambulatory Visit (INDEPENDENT_AMBULATORY_CARE_PROVIDER_SITE_OTHER): Payer: Medicare Other | Admitting: *Deleted

## 2016-04-15 ENCOUNTER — Telehealth: Payer: Self-pay | Admitting: Cardiology

## 2016-04-15 DIAGNOSIS — Z959 Presence of cardiac and vascular implant and graft, unspecified: Secondary | ICD-10-CM

## 2016-04-15 DIAGNOSIS — R001 Bradycardia, unspecified: Secondary | ICD-10-CM | POA: Diagnosis not present

## 2016-04-15 NOTE — Telephone Encounter (Signed)
Spoke with pt and reminded pt of remote transmission that is due today. Pt verbalized understanding.   

## 2016-04-16 NOTE — Progress Notes (Signed)
Remote pacemaker transmission.   

## 2016-04-29 LAB — CUP PACEART REMOTE DEVICE CHECK
Battery Voltage: 3.03 V
Brady Statistic AP VP Percent: 0.34 %
Brady Statistic AS VP Percent: 0.1 %
Brady Statistic RA Percent Paced: 61.95 %
Implantable Lead Implant Date: 20160921
Implantable Lead Implant Date: 20160921
Implantable Lead Location: 753859
Implantable Lead Location: 753860
Implantable Lead Model: 5076
Lead Channel Impedance Value: 380 Ohm
Lead Channel Pacing Threshold Amplitude: 0.25 V
Lead Channel Pacing Threshold Amplitude: 0.625 V
Lead Channel Pacing Threshold Pulse Width: 0.4 ms
Lead Channel Sensing Intrinsic Amplitude: 10.625 mV
Lead Channel Setting Pacing Amplitude: 2 V
Lead Channel Setting Pacing Pulse Width: 0.4 ms
Lead Channel Setting Sensing Sensitivity: 2.8 mV
MDC IDC MSMT BATTERY REMAINING LONGEVITY: 116 mo
MDC IDC MSMT LEADCHNL RA IMPEDANCE VALUE: 380 Ohm
MDC IDC MSMT LEADCHNL RA IMPEDANCE VALUE: 437 Ohm
MDC IDC MSMT LEADCHNL RA SENSING INTR AMPL: 2.125 mV
MDC IDC MSMT LEADCHNL RA SENSING INTR AMPL: 2.125 mV
MDC IDC MSMT LEADCHNL RV IMPEDANCE VALUE: 475 Ohm
MDC IDC MSMT LEADCHNL RV PACING THRESHOLD PULSEWIDTH: 0.4 ms
MDC IDC MSMT LEADCHNL RV SENSING INTR AMPL: 10.625 mV
MDC IDC SESS DTM: 20170530173305
MDC IDC SET LEADCHNL RV PACING AMPLITUDE: 2.5 V
MDC IDC STAT BRADY AP VS PERCENT: 61.61 %
MDC IDC STAT BRADY AS VS PERCENT: 37.94 %
MDC IDC STAT BRADY RV PERCENT PACED: 0.44 %

## 2016-05-06 ENCOUNTER — Encounter: Payer: Self-pay | Admitting: Cardiology

## 2016-07-16 ENCOUNTER — Encounter: Payer: Self-pay | Admitting: Internal Medicine

## 2016-07-16 ENCOUNTER — Ambulatory Visit (INDEPENDENT_AMBULATORY_CARE_PROVIDER_SITE_OTHER): Payer: Medicare Other | Admitting: Internal Medicine

## 2016-07-16 DIAGNOSIS — Z950 Presence of cardiac pacemaker: Secondary | ICD-10-CM

## 2016-07-16 NOTE — Progress Notes (Signed)
Patient Care Team: [name], MD as PCP - General (Internal Medicine)   HPI  Grant Giglia. is a 75 y.o. male Seen in followup for syncope with Wide QRS Tachycardia felt to be VT , noninducibility at EPS with LV dysfunction; cath normal CA 2/15    He has had problems with Afib and RVR as well as sinus bradycardia.   Syncope was associated with profound bradycardia; monitoring demonstrated subsequent control of atrial fibrillation rates  9/16 he was admitted to hospital with implantation of pacemaker and  initiation of dofetilide. Seen in the   2/17 K4.6 Cr 0.88 mag 2.1   5/17 K4.3 Cr 0.8      Echocardiogram 2/15 and ejection fraction of 35-40% Echocardiogram 5/16 demonstrated significant interval improvement with an EF of 55-60%.  The patient denies chest pain, shortness of breath, nocturnal dyspnea, orthopnea or peripheral edema.  There have been no palpitations, lightheadedness or syncope.   His wife says he is doing as well as it was 22 or 20 years ago;  mild bleeding issues superficial injury  Past Medical History:  Diagnosis Date  . Persistent atrial fibrillation ([place])    a. on tikosyn   . Sinus bradycardia    a. s/p MDT PPM  . Syncope   . Ventricular tachycardia (paroxysmal)     Past Surgical History:  Procedure Laterality Date  . ELECTROPHYSIOLOGY STUDY  01-08-14   negative EPS by Dr Lovena Le following syncopal episode and documented WCT  . ELECTROPHYSIOLOGY STUDY N/A 01/09/2014   Procedure: ELECTROPHYSIOLOGY STUDY +/- ICD, +/- ILR;  Surgeon: Evans Lance, MD;  Location: [facility] CATH LAB;  Service: Cardiovascular;  Laterality: N/A;  . EP IMPLANTABLE DEVICE N/A 08/08/2015   Procedure: Pacemaker Implant;  Surgeon: Deboraha Sprang, MD;  Location: [place] CV LAB;  Service: Cardiovascular;  Laterality: N/A;  . EP IMPLANTABLE DEVICE N/A 08/08/2015   Procedure: Loop Recorder Removal;  Surgeon: Deboraha Sprang, MD;  Location: [place] CV LAB;  Service:  Cardiovascular;  Laterality: N/A;  . FINGER SURGERY     Noted the only time patient has been admitted overnight.    Marland Kitchen LEFT HEART CATHETERIZATION WITH CORONARY ANGIOGRAM N/A 01/06/2014   Procedure: LEFT HEART CATHETERIZATION WITH CORONARY ANGIOGRAM;  Surgeon: Leonie Man, MD;  Location: [facility] CATH LAB;  Service: Cardiovascular;  Laterality: N/A;  . LOOP RECORDER IMPLANT  01-08-14   MDT LinQ implanted by Dr Lovena Le for syncope and WCT    Current Outpatient Prescriptions  Medication Sig Dispense Refill  . apixaban (ELIQUIS) 5 MG TABS tablet Take 1 tablet (5 mg total) by mouth 2 (two) times daily. 180 tablet 3  . Cholecalciferol (VITAMIN D3) 1000 units CAPS Take 1,000 Units by mouth daily.     Marland Kitchen dofetilide (TIKOSYN) 500 MCG capsule Take 1 capsule (500 mcg total) by mouth 2 (two) times daily. 180 capsule 3  . magnesium oxide (MAG-OX) 400 MG tablet Take 1 tablet (400 mg total) by mouth daily. 30 tablet 6  . timolol (TIMOPTIC) 0.5 % ophthalmic solution Place 1 drop into the right eye at bedtime.    . vitamin B-12 (CYANOCOBALAMIN) 1000 MCG tablet Take 1,000 mcg by mouth daily.     No current facility-administered medications for this visit.     No Known Allergies  Review of Systems negative except from HPI and PMH  Physical Exam BP 120/72   Pulse (!) 56   Ht 6\' 4"  (1.93 m)  Wt 205 lb 9.6 oz (93.3 kg)   SpO2 97%   BMI 25.03 kg/m  Well developed and nourished in no acute distress HENT normal Neck supple with JVP-flat Clear Regular rate and rhythm, no murmurs or gallops Abd-soft with active BS No Clubbing cyanosis edema Skin-warm and dry A & Oriented  Grossly normal sensory and motor function   ECG demonstrates sinus rhythm at  Atrial pacing at 60 Intervals 20/12/40 PVCs with compensatory pauses and ventricular pacing      Assessment and  Plan  Syncope. No recurrence  Atrial fibrillation/RVR holding sinus  Sinus bradycardia/Chronotropic incompeten e  Wide QRS  tachycardia  PVCs  Congestive heart failure-chronic diastolic  Nonischemic cardiomyopathy normalized  Pacemaker-Medtronic  High risk medication-dofetilide   Patient is questions regarding watchman. I reviewed data that minus that is comparable to Coumadin. It is my impression that Coumadin is inferior to [facility] and they're willing to stay on ELIQUIS. They're tolerating both of the drugs reasonably.  Euvolemic continue current meds  Almost no AFib

## 2016-07-16 NOTE — Patient Instructions (Addendum)
Medication Instructions:  Your physician recommends that you continue on your current medications as directed. Please refer to the Current Medication list given to you today.   Labwork: None ordered   Testing/Procedures: None ordered   Follow-Up: Your physician wants you to follow-up in: 6 months with Dr Gari Crown will receive a reminder letter in the mail two months in advance. If you don't receive a letter, please call our office to schedule the follow-up appointment.  Remote monitoring is used to monitor your Pacemaker of ICD from home. This monitoring reduces the number of office visits required to check your device to one time per year. It allows [place] to keep an eye on the functioning of your device to ensure it is working properly. You are scheduled for a device check from home on 10/15/16. You may send your transmission at any time that day. If you have a wireless device, the transmission will be sent automatically. After your physician reviews your transmission, you will receive a postcard with your next transmission date.     Any Other Special Instructions Will Be Listed Below (If Applicable).     If you need a refill on your cardiac medications before your next appointment, please call your pharmacy.

## 2016-10-15 ENCOUNTER — Ambulatory Visit (INDEPENDENT_AMBULATORY_CARE_PROVIDER_SITE_OTHER): Payer: Medicare Other | Admitting: *Deleted

## 2016-10-15 DIAGNOSIS — R001 Bradycardia, unspecified: Secondary | ICD-10-CM

## 2016-10-15 NOTE — Progress Notes (Signed)
Remote pacemaker transmission.   

## 2016-10-16 ENCOUNTER — Telehealth: Payer: Self-pay | Admitting: Internal Medicine

## 2016-10-16 DIAGNOSIS — R3916 Straining to void: Secondary | ICD-10-CM

## 2016-10-16 DIAGNOSIS — N401 Enlarged prostate with lower urinary tract symptoms: Secondary | ICD-10-CM | POA: Insufficient documentation

## 2016-10-16 NOTE — Telephone Encounter (Signed)
New message      Pt c/o medication issue:  1. Name of Medication: tamsulosin 2. How are you currently taking this medication (dosage and times per day)? .4mg  cap.  Take 1 cap 30min after same meal daily 3. Are you having a reaction (difficulty breathing--STAT)? no 4. What is your medication issue?  Calling to see if it is ok to take this medication along with his heart meds

## 2016-10-16 NOTE — Telephone Encounter (Signed)
To Dr. Klein to review. 

## 2016-10-16 NOTE — Telephone Encounter (Signed)
Reviewed with Dr. Caryl Comes- patient ok to take Tamsulosin (flomax). I have spoken with the patient and he is agreeable with this.

## 2016-10-24 ENCOUNTER — Encounter: Payer: Self-pay | Admitting: Cardiology

## 2016-11-18 LAB — CUP PACEART REMOTE DEVICE CHECK
Brady Statistic AP VP Percent: 3.12 %
Brady Statistic AP VS Percent: 59.21 %
Brady Statistic AS VP Percent: 4.17 %
Implantable Lead Location: 753859
Implantable Lead Location: 753860
Implantable Lead Model: 5076
Implantable Lead Model: 5076
Lead Channel Impedance Value: 323 Ohm
Lead Channel Impedance Value: 361 Ohm
Lead Channel Impedance Value: 418 Ohm
Lead Channel Pacing Threshold Amplitude: 1.25 V
Lead Channel Pacing Threshold Pulse Width: 0.4 ms
Lead Channel Sensing Intrinsic Amplitude: 2.875 mV
Lead Channel Sensing Intrinsic Amplitude: 8.25 mV
Lead Channel Sensing Intrinsic Amplitude: 8.25 mV
Lead Channel Setting Pacing Amplitude: 2.5 V
Lead Channel Setting Pacing Pulse Width: 0.4 ms
MDC IDC LEAD IMPLANT DT: 20160921
MDC IDC LEAD IMPLANT DT: 20160921
MDC IDC MSMT BATTERY REMAINING LONGEVITY: 108 mo
MDC IDC MSMT BATTERY VOLTAGE: 3.02 V
MDC IDC MSMT LEADCHNL RA PACING THRESHOLD AMPLITUDE: 0.375 V
MDC IDC MSMT LEADCHNL RA PACING THRESHOLD PULSEWIDTH: 0.4 ms
MDC IDC MSMT LEADCHNL RA SENSING INTR AMPL: 2.875 mV
MDC IDC MSMT LEADCHNL RV IMPEDANCE VALUE: 418 Ohm
MDC IDC PG IMPLANT DT: 20160921
MDC IDC SESS DTM: 20171129143830
MDC IDC SET LEADCHNL RA PACING AMPLITUDE: 2 V
MDC IDC SET LEADCHNL RV SENSING SENSITIVITY: 2.8 mV
MDC IDC STAT BRADY AS VS PERCENT: 33.5 %
MDC IDC STAT BRADY RA PERCENT PACED: 59.48 %
MDC IDC STAT BRADY RV PERCENT PACED: 7.93 %

## 2017-01-12 ENCOUNTER — Ambulatory Visit (INDEPENDENT_AMBULATORY_CARE_PROVIDER_SITE_OTHER): Payer: Medicare Other | Admitting: Internal Medicine

## 2017-01-12 ENCOUNTER — Encounter: Payer: Self-pay | Admitting: Internal Medicine

## 2017-01-12 VITALS — BP 116/70 | HR 73 | Ht 76.0 in | Wt 210.4 lb

## 2017-01-12 DIAGNOSIS — Z950 Presence of cardiac pacemaker: Secondary | ICD-10-CM

## 2017-01-12 DIAGNOSIS — R001 Bradycardia, unspecified: Secondary | ICD-10-CM | POA: Diagnosis not present

## 2017-01-12 DIAGNOSIS — I428 Other cardiomyopathies: Secondary | ICD-10-CM | POA: Diagnosis not present

## 2017-01-12 DIAGNOSIS — I5032 Chronic diastolic (congestive) heart failure: Secondary | ICD-10-CM | POA: Diagnosis not present

## 2017-01-12 DIAGNOSIS — I480 Paroxysmal atrial fibrillation: Secondary | ICD-10-CM

## 2017-01-12 LAB — CUP PACEART INCLINIC DEVICE CHECK
Date Time Interrogation Session: 20180226144023
Implantable Lead Implant Date: 20160921
Implantable Lead Location: 753859
Implantable Lead Location: 753860
Implantable Lead Model: 5076
MDC IDC LEAD IMPLANT DT: 20160921
MDC IDC PG IMPLANT DT: 20160921

## 2017-01-12 MED ORDER — DOFETILIDE 500 MCG PO CAPS: 500.0000 ug | ORAL_CAPSULE | Freq: Two times a day (BID) | ORAL | 3 refills | Status: DC

## 2017-01-12 MED ORDER — APIXABAN 5 MG PO TABS: 5.0000 mg | ORAL_TABLET | Freq: Two times a day (BID) | ORAL | 3 refills | Status: DC

## 2017-01-12 NOTE — Patient Instructions (Addendum)
Medication Instructions: Your physician recommends that you continue on your current medications as directed. Please refer to the Current Medication list given to you today.   Labwork: None Ordered  Procedures/Testing: None Ordered  Follow-Up: Your physician wants you to follow-up in 6 MONTHS with Dr. Caryl Comes. You will receive a reminder letter in the mail two months in advance. If you don't receive a letter, please call our office to schedule the follow-up appointment.  Remote monitoring is used to monitor your Pacemaker from home. This monitoring reduces the number of office visits required to check your device to one time per year. It allows [place] to keep an eye on the functioning of your device to ensure it is working properly. You are scheduled for a device check from home on 04/14/17. You may send your transmission at any time that day. If you have a wireless device, the transmission will be sent automatically. After your physician reviews your transmission, you will receive a postcard with your next transmission date.    Any Additional Special Instructions Will Be Listed Below (If Applicable).     If you need a refill on your cardiac medications before your next appointment, please call your pharmacy.

## 2017-01-12 NOTE — Progress Notes (Signed)
Patient Care Team: [name], MD as PCP - General (Internal Medicine)   HPI  Grant Mcghie. is a 76 y.o. male Seen in followup for syncope with Wide QRS Tachycardia felt to be VT , noninducibility at EPS with LV dysfunction; cath normal CA 2/15    He has had problems with Afib and RVR as well as sinus bradycardia.   Syncope was associated with profound bradycardia; monitoring demonstrated subsequent control of atrial fibrillation rates  9/16 he was admitted to hospital with implantation of pacemaker and  initiation of dofetilide. Seen in the   2/17 K4.6 Cr 0.88 mag 2.1   5/17 K4.3 Cr 0.8 11/17 K 4.4  Cr 0.8    Echocardiogram 2/15 and ejection fraction of 35-40% Echocardiogram 5/16 demonstrated significant interval improvement with an EF of 55-60%.  The patient denies chest pain, shortness of breath, nocturnal dyspnea, orthopnea or peripheral edema.  There have been no  lightheadedness or syncope.   He has had some sense no remotepalps  But overall doing great.    His wife says he is doing as well as it was 89 or 20 years ago;  mild bleeding issues superficial injury  Past Medical History:  Diagnosis Date  . Persistent atrial fibrillation ([place])    a. on tikosyn   . Sinus bradycardia    a. s/p MDT PPM  . Syncope   . Ventricular tachycardia (paroxysmal)     Past Surgical History:  Procedure Laterality Date  . ELECTROPHYSIOLOGY STUDY  01-08-14   negative EPS by Dr Lovena Le following syncopal episode and documented WCT  . ELECTROPHYSIOLOGY STUDY N/A 01/09/2014   Procedure: ELECTROPHYSIOLOGY STUDY +/- ICD, +/- ILR;  Surgeon: Evans Lance, MD;  Location: [facility] CATH LAB;  Service: Cardiovascular;  Laterality: N/A;  . EP IMPLANTABLE DEVICE N/A 08/08/2015   Procedure: Pacemaker Implant;  Surgeon: Deboraha Sprang, MD;  Location: [place] CV LAB;  Service: Cardiovascular;  Laterality: N/A;  . EP IMPLANTABLE DEVICE N/A 08/08/2015   Procedure: Loop Recorder Removal;   Surgeon: Deboraha Sprang, MD;  Location: [place] CV LAB;  Service: Cardiovascular;  Laterality: N/A;  . FINGER SURGERY     Noted the only time patient has been admitted overnight.    Marland Kitchen LEFT HEART CATHETERIZATION WITH CORONARY ANGIOGRAM N/A 01/06/2014   Procedure: LEFT HEART CATHETERIZATION WITH CORONARY ANGIOGRAM;  Surgeon: Leonie Man, MD;  Location: [facility] CATH LAB;  Service: Cardiovascular;  Laterality: N/A;  . LOOP RECORDER IMPLANT  01-08-14   MDT LinQ implanted by Dr Lovena Le for syncope and WCT    Current Outpatient Prescriptions  Medication Sig Dispense Refill  . apixaban (ELIQUIS) 5 MG TABS tablet Take 1 tablet (5 mg total) by mouth 2 (two) times daily. 180 tablet 3  . Cholecalciferol (VITAMIN D3) 1000 units CAPS Take 1,000 Units by mouth daily.     Marland Kitchen dofetilide (TIKOSYN) 500 MCG capsule Take 1 capsule (500 mcg total) by mouth 2 (two) times daily. 180 capsule 3  . magnesium oxide (MAG-OX) 400 MG tablet Take 1 tablet (400 mg total) by mouth daily. 30 tablet 6  . tamsulosin (FLOMAX) 0.4 MG CAPS capsule Take 2 capsules by mouth daily.    . timolol (TIMOPTIC) 0.5 % ophthalmic solution Place 1 drop into the right eye at bedtime.    . vitamin B-12 (CYANOCOBALAMIN) 1000 MCG tablet Take 1,000 mcg by mouth daily.     No current facility-administered medications for this visit.  No Known Allergies  Review of Systems negative except from HPI and PMH  Physical Exam BP 116/70   Pulse 73   Ht 6\' 4"  (1.93 m)   Wt 210 lb 6.4 oz (95.4 kg)   SpO2 97%   BMI 25.61 kg/m  Well developed and nourished in no acute distress HENT normal Neck supple with JVP-flat Clear Regular rate and rhythm, no murmurs or gallops Abd-soft with active BS No Clubbing cyanosis edema Skin-warm and dry A & Oriented  Grossly normal sensory and motor function   ECG demonstrates sinus rhythm at  Atrial pacing at 71 Intervals 22/13/43    Assessment and  Plan  Syncope. No recurrence  Atrial  fibrillation/RVR holding sinus  Sinus bradycardia/Chronotropic incompetence  Wide QRS tachycardia  PVCs  Congestive heart failure-chronic diastolic  Nonischemic cardiomyopathy normalized  Pacemaker-Medtronic  High risk medication-dofetilide   No intercurrent atrial fibrillation or flutter  Good chronotorpic response  PVCs scant--improved  Last K level in CareEverywhere was 4.7   On Anticoagulation;  No bleeding issues  Hgb 14.7

## 2017-01-31 ENCOUNTER — Other Ambulatory Visit: Payer: Self-pay | Admitting: Nurse Practitioner

## 2017-02-09 ENCOUNTER — Telehealth: Payer: Self-pay | Admitting: Internal Medicine

## 2017-02-09 NOTE — Telephone Encounter (Signed)
Spoke with patient and requested remote transmission. Patient verbalized understanding and will send one this evening.

## 2017-02-09 NOTE — Telephone Encounter (Signed)
Please arrange a transmission of his device

## 2017-02-09 NOTE — Telephone Encounter (Signed)
Pt states that lately his heart has been out of rhythm more frequently and the heart beats feels stronger. Pt said that the pacemaker was programed for a low volume rhythm and   the beats are harder. Pt states he has send 2 or 3 reading in the last 4 to 5 days. He would like to know if the device clinic have received those readings. He would like to  know also if the pacemaker can be reprogram to monitor the effect of these harder beats. Pt also states that his PCP prescribed Flomax 0.4 mg daily, then the dose was increased to 0.8 mg for better results, then pt started to have these frequent episodes of A-fib. Pt's PCP then decreased the dose to 0.4 mg again because the some side effects are irregular heart beats, stuffy nose. Pt would like to know if Dr. Caryl Comes can prescribed other medication that have the same effec as Flomax without the side effects.

## 2017-02-09 NOTE — Telephone Encounter (Signed)
Grant Moreno is calling and wanting to speak with someone about his heart rhythm and hard heart beats . Please call

## 2017-02-10 NOTE — Telephone Encounter (Signed)
See that labs were checked 2/18  Lets try him on metoprolol succinate 25

## 2017-02-10 NOTE — Telephone Encounter (Signed)
PVC burden about 5%  No atrial fibrillation  Will check bmet and Mg

## 2017-02-10 NOTE — Telephone Encounter (Signed)
Transmission Received: Presenting Sinus Rhythm. 2 episodes of SVT on 3/15~ longest 20min44sec avg A/V 158/158. No episodes of AT/AF. From March 24-26 PVC Runs (2-4 beats)~ 27.1 per hour. PVC singles 242 per hour.

## 2017-02-10 NOTE — Telephone Encounter (Signed)
Will call w transmission when returns from [facility]

## 2017-02-11 ENCOUNTER — Telehealth: Payer: Self-pay | Admitting: Internal Medicine

## 2017-02-11 ENCOUNTER — Other Ambulatory Visit: Payer: Medicare Other | Admitting: *Deleted

## 2017-02-11 ENCOUNTER — Telehealth: Payer: Self-pay

## 2017-02-11 DIAGNOSIS — I472 Ventricular tachycardia, unspecified: Secondary | ICD-10-CM

## 2017-02-11 DIAGNOSIS — I480 Paroxysmal atrial fibrillation: Secondary | ICD-10-CM

## 2017-02-11 MED ORDER — METOPROLOL TARTRATE 25 MG PO TABS: 25.0000 mg | ORAL_TABLET | Freq: Two times a day (BID) | ORAL | 3 refills | Status: DC

## 2017-02-11 NOTE — Telephone Encounter (Signed)
Returned call to patient.He stated he spoke to Dr.Klein last night and he was going to prescribe Metoprolol.Stated he thinks Tamsulosin is causing problems and he is thinking he may not need to take Metoprolol.Advised I will send message to [facility].

## 2017-02-11 NOTE — Telephone Encounter (Signed)
Spoke with pt regarding Lopressor, pt stated that last night his heart was thumping really bad, pt sent in transmission, transmission reviewed, noted that pt was in AF/ AFlutter for 54 min, w/  approximately 2 min of RVR. Pt stated that all this started when his Flomax dose was increased to 0.8 by Dr. Ouida Sills. Pt had spoke with Dr. Ouida Sills and dropped dosage back to 0.4mg , but still having symptoms Pt asked about stopping Flomax all together informed pt that he would need to discuss with Dr.Anderson. Pt stated that he has left msg with [place]. Pt asked that Dr. Caryl Comes be made aware of this. Pt agreeable to starting Lopressor 25mg  twice a day. Informed pt to check his BP and to call us if he was having any dizziness or syncope after starting medication. Pt voiced understanding, pt also stated that every time he felt his heart beating hard that he was going to send in a transmission.

## 2017-02-11 NOTE — Telephone Encounter (Signed)
New message      Pt c/o medication issue:  1. Name of Medication:  tamsulosin  2. How are you currently taking this medication (dosage and times per day)? 04mg  3. Are you having a reaction (difficulty breathing--STAT)? no  4. What is your medication issue? Pt talked to Dr Caryl Comes last night around 7pm but forgot to tell him that he was taking this medication.  Since starting this medication, pt states that last night his heart felt like a "sledge hammer" was hitting him and also he fell today. He states that he has all of the side effects.  Please call

## 2017-02-11 NOTE — Telephone Encounter (Signed)
Spoke with pt informed him that he needs updated labs. Pt agreeable to come to [address] office to have labs drawn.

## 2017-02-12 ENCOUNTER — Telehealth: Payer: Self-pay | Admitting: Internal Medicine

## 2017-02-12 DIAGNOSIS — I480 Paroxysmal atrial fibrillation: Secondary | ICD-10-CM

## 2017-02-12 LAB — BASIC METABOLIC PANEL
BUN/Creatinine Ratio: 29 — ABNORMAL HIGH (ref 10–24)
BUN: 27 mg/dL (ref 8–27)
CALCIUM: 8.8 mg/dL (ref 8.6–10.2)
CHLORIDE: 104 mmol/L (ref 96–106)
CO2: 23 mmol/L (ref 18–29)
Creatinine, Ser: 0.92 mg/dL (ref 0.76–1.27)
GFR calc non Af Amer: 81 mL/min/{1.73_m2} (ref >59)
GFR, EST AFRICAN AMERICAN: 93 mL/min/{1.73_m2} (ref >59)
GLUCOSE: 110 mg/dL — AB (ref 65–99)
POTASSIUM: 4.8 mmol/L (ref 3.5–5.2)
Sodium: 142 mmol/L (ref 134–144)

## 2017-02-12 LAB — MAGNESIUM: Magnesium: 2.1 mg/dL (ref 1.6–2.3)

## 2017-02-12 MED ORDER — METOPROLOL TARTRATE 25 MG PO TABS: 12.5000 mg | ORAL_TABLET | Freq: Two times a day (BID) | ORAL | Status: DC

## 2017-02-12 NOTE — Telephone Encounter (Signed)
Per note from Dr. Caryl Comes on 02/09/17: Grant Sprang, MD  to Wanda Plump, RN     6:43 PM  Note    See that labs were checked 2/18  Lets try him on metoprolol succinate 25       Reviewed with Dr. Caryl Comes today that metoprolol tartrate was sent in for the patient. Per Dr. Caryl Comes- have the patient take metoprolol tartrate 25 mg 1/2 tablet (12.5 mg) by mouth twice daily.  I called to notify the patient of this.  We discussed what happened with his prescription and Dr. Olin Pia recommendations.  He states that since his flomax was stopped he feels like his heart has settled down and his BP is running about 859 systolic. I advised him that per Dr. Caryl Comes, as long as he is voiding, he is ok without the flomax. I also advised him that he is ok not to start metoprolol at this time, but may take it if he needs it if fast heart rates return.  He states he feels more comfortable with that since his BP is already on the low side.  He is agreeable with starting metoprolol tart 25 mg- 1/2 tablet twice daily if needed.  He will call back if needed.

## 2017-02-12 NOTE — Telephone Encounter (Signed)
Pt called and stated that the metoprolol prescription says to take 1 25 MG tablet BID. He stated that the MD told him on 02-09-17 to take 1/2 tablet of 25 MG once daily. Pt stated that 4-6 years ago. Another MD prescribed this medicine to him and it made him feel really bad. Pt stated that when MD asked him if he had taking the medicine in the past he had forgotten that he had taken it before. Dr. Ouida Sills told him to stop taking the Flomax. His last dose was on Tuesday 02-10-17. His blood pressure has been 102/75 and 119/76 since stopping the flomax. Pt stated that he has felt good today.

## 2017-02-12 NOTE — Telephone Encounter (Signed)
New message  Pt c/o medication issue:  1. Name of Medication: Metoprolol  2. How are you currently taking this medication (dosage and times per day)? 25mg    3. Are you having a reaction (difficulty breathing--STAT)? No  4. What is your medication issue? Pt call requesting to speak with Dr. Caryl Comes or RN about the correct instructions for medication. Pt states he was instructed to take a 1/2 tab. The label reads to take 1 tab twice daily. Please call back to discuss.

## 2017-02-16 ENCOUNTER — Telehealth: Payer: Self-pay

## 2017-02-16 NOTE — Telephone Encounter (Signed)
Pt is aware and agreeble to normal results

## 2017-03-11 ENCOUNTER — Emergency Department
Admission: EM | Admit: 2017-03-11 | Discharge: 2017-03-11 | Disposition: A | Discharge status: Home / Self Care | Payer: Medicare Other | Attending: Emergency Medicine | Admitting: Emergency Medicine

## 2017-03-11 DIAGNOSIS — Z87891 Personal history of nicotine dependence: Secondary | ICD-10-CM | POA: Insufficient documentation

## 2017-03-11 DIAGNOSIS — Z79899 Other long term (current) drug therapy: Secondary | ICD-10-CM | POA: Insufficient documentation

## 2017-03-11 DIAGNOSIS — R55 Syncope and collapse: Secondary | ICD-10-CM | POA: Diagnosis present

## 2017-03-11 LAB — CBC
HCT: 39.9 % — ABNORMAL LOW (ref 40.0–52.0)
HEMOGLOBIN: 13.7 g/dL (ref 13.0–18.0)
MCH: 33.5 pg (ref 26.0–34.0)
MCHC: 34.3 g/dL (ref 32.0–36.0)
MCV: 97.6 fL (ref 80.0–100.0)
PLATELETS: 188 10*3/uL (ref 150–440)
RBC: 4.08 MIL/uL — AB (ref 4.40–5.90)
RDW: 13.2 % (ref 11.5–14.5)
WBC: 7.6 10*3/uL (ref 3.8–10.6)

## 2017-03-11 LAB — BASIC METABOLIC PANEL
ANION GAP: 9 (ref 5–15)
BUN: 24 mg/dL — ABNORMAL HIGH (ref 6–20)
CHLORIDE: 106 mmol/L (ref 101–111)
CO2: 22 mmol/L (ref 22–32)
Calcium: 8.4 mg/dL — ABNORMAL LOW (ref 8.9–10.3)
Creatinine, Ser: 0.94 mg/dL (ref 0.61–1.24)
GFR calc Af Amer: >60 mL/min (ref >60)
GFR calc non Af Amer: >60 mL/min (ref >60)
Glucose, Bld: 108 mg/dL — ABNORMAL HIGH (ref 65–99)
Potassium: 4.4 mmol/L (ref 3.5–5.1)
SODIUM: 137 mmol/L (ref 135–145)

## 2017-03-11 LAB — URINALYSIS, COMPLETE (UACMP) WITH MICROSCOPIC
BILIRUBIN URINE: NEGATIVE
Bacteria, UA: NONE SEEN
Glucose, UA: NEGATIVE mg/dL
HGB URINE DIPSTICK: NEGATIVE
KETONES UR: NEGATIVE mg/dL
LEUKOCYTES UA: NEGATIVE
Nitrite: NEGATIVE
PH: 5 (ref 5.0–8.0)
Protein, ur: 100 mg/dL — AB
SQUAMOUS EPITHELIAL / LPF: NONE SEEN
Specific Gravity, Urine: 1.026 (ref 1.005–1.030)

## 2017-03-11 LAB — TROPONIN I: Troponin I: <0.03 ng/mL (ref <0.03)

## 2017-03-11 LAB — GLUCOSE, CAPILLARY: Glucose-Capillary: 104 mg/dL — ABNORMAL HIGH (ref 65–99)

## 2017-03-11 NOTE — ED Triage Notes (Signed)
He arrives today via [facility] from [place] - pt was there for a scheduled procedure when he experienced a syncopal episode.  Pt alert and oriented upon arrival here - he reports taking an enema this am - eating some cheese toast and taking some premeds including 7.5 percocet and a sedative   EMS reports hypotension  76/46  CBG 111 SB 58 pacemaker in place

## 2017-03-11 NOTE — ED Provider Notes (Signed)
[facility] Emergency Department Provider Note  ____________________________________________  Time seen: Approximately 2:02 PM  I have reviewed the triage vital signs and the nursing notes.   HISTORY  Chief Complaint Loss of Consciousness    HPI Grant Moreno. is a 76 y.o. male brought to the ED from the urology office due to syncope. He been in his usual state of health, ate breakfast today, and then before he went to the urology office for a procedure he took a Percocet and a Halcion. He then went to the office and was sitting in the waiting room talking to other patients about the procedure he was going to have a got dizzy and passed out briefly. Loss of consciousness was only brief on the order of a few seconds. He denies any preceding symptoms such as headache vision change numbness tingling weakness dizziness chest pain shortness of breath or back pain. Afterward he had no symptoms. States that currently he feels fine     Past Medical History:  Diagnosis Date  . Persistent atrial fibrillation ([place])    a. on tikosyn   . Sinus bradycardia    a. s/p MDT PPM  . Syncope   . Ventricular tachycardia (paroxysmal)      Patient Active Problem List   Diagnosis Date Noted  . Sinus bradycardia 11/14/2015  . Pacemaker- Medtronic 11/14/2015  . PAF (paroxysmal atrial fibrillation) ([place]) 08/06/2015  . Chronic anticoagulation - Eliquis   . Nonischemic cardiomyopathy ([place]) 01/31/2014  . Chronic systolic heart failure ([place]) 01/31/2014  . Ventricular tachycardia (paroxysmal) ([place]) 01/04/2014     Past Surgical History:  Procedure Laterality Date  . ELECTROPHYSIOLOGY STUDY  01-08-14   negative EPS by Dr Lovena Le following syncopal episode and documented WCT  . ELECTROPHYSIOLOGY STUDY N/A 01/09/2014   Procedure: ELECTROPHYSIOLOGY STUDY +/- ICD, +/- ILR;  Surgeon: Evans Lance, MD;  Location: [facility] CATH LAB;  Service: Cardiovascular;  Laterality: N/A;  . EP IMPLANTABLE  DEVICE N/A 08/08/2015   Procedure: Pacemaker Implant;  Surgeon: Deboraha Sprang, MD;  Location: [place] CV LAB;  Service: Cardiovascular;  Laterality: N/A;  . EP IMPLANTABLE DEVICE N/A 08/08/2015   Procedure: Loop Recorder Removal;  Surgeon: Deboraha Sprang, MD;  Location: [place] CV LAB;  Service: Cardiovascular;  Laterality: N/A;  . FINGER SURGERY     Noted the only time patient has been admitted overnight.    Marland Kitchen LEFT HEART CATHETERIZATION WITH CORONARY ANGIOGRAM N/A 01/06/2014   Procedure: LEFT HEART CATHETERIZATION WITH CORONARY ANGIOGRAM;  Surgeon: Leonie Man, MD;  Location: [facility] CATH LAB;  Service: Cardiovascular;  Laterality: N/A;  . LOOP RECORDER IMPLANT  01-08-14   MDT LinQ implanted by Dr Lovena Le for syncope and WCT     Prior to Admission medications   Medication Sig Start Date End Date Taking? Authorizing Provider  amoxicillin-clavulanate (AUGMENTIN) 500-125 MG tablet Take 1 tablet by mouth daily. 03/10/17 03/13/17 Yes Historical Provider, MD  apixaban (ELIQUIS) 5 MG TABS tablet Take 1 tablet (5 mg total) by mouth 2 (two) times daily. 01/12/17  Yes Deboraha Sprang, MD  Cholecalciferol (VITAMIN D3) 1000 units CAPS Take 1,000 Units by mouth daily.    Yes Historical Provider, MD  dofetilide (TIKOSYN) 500 MCG capsule Take 1 capsule (500 mcg total) by mouth 2 (two) times daily. 01/12/17  Yes Deboraha Sprang, MD  hyoscyamine (LEVSIN SL) 0.125 MG SL tablet Take 0.25 mg by mouth daily. 03/04/17  Yes Historical Provider, MD  magnesium oxide (  MAG-OX) 400 MG tablet Take 1 tablet (400 mg total) by mouth daily. 12/26/14  Yes Deboraha Sprang, MD  oxyCODONE-acetaminophen (PERCOCET) 7.5-325 MG tablet Take 1 tablet by mouth every 6 (six) hours as needed. 03/04/17  Yes Historical Provider, MD  tamsulosin (FLOMAX) 0.4 MG CAPS capsule Take 0.4 mg by mouth daily.  12/16/16 12/16/17 Yes Historical Provider, MD  timolol (TIMOPTIC) 0.5 % ophthalmic solution Place 1 drop into the right eye at bedtime.   Yes  Historical Provider, MD  triazolam (HALCION) 0.25 MG tablet Take 0.25 mg by mouth See admin instructions. tk 1 hr prior to pre-op 03/04/17  Yes Historical Provider, MD  vitamin B-12 (CYANOCOBALAMIN) 1000 MCG tablet Take 1,000 mcg by mouth daily.   Yes Historical Provider, MD  metoprolol tartrate (LOPRESSOR) 25 MG tablet Take 0.5 tablets (12.5 mg total) by mouth 2 (two) times daily. Patient not taking: Reported on 03/11/2017 02/12/17 05/13/17  Deboraha Sprang, MD     Allergies Patient has no known allergies.   No family history on file.  Social History Social History  Substance Use Topics  . Smoking status: Former Smoker    Packs/day: 3.00    Types: Cigarettes  . Smokeless tobacco: Former Systems developer    Types: Chew  . Alcohol use No    Review of Systems  Constitutional:   No fever or chills.  ENT:   No sore throat. No rhinorrhea. Lymphatic: No swollen glands, No extremity swelling Endocrine: No hot/cold flashes. No significant weight change. No neck swelling. Cardiovascular:   No chest pain , positive syncope. Respiratory:   No dyspnea or cough. Gastrointestinal:   Negative for abdominal pain, vomiting and diarrhea.  Genitourinary:   Negative for dysuria or difficulty urinating. Musculoskeletal:   Negative for focal pain or swelling Neurological:   Negative for headaches or weakness. All other systems reviewed and are negative except as documented above in ROS and HPI.  ____________________________________________   PHYSICAL EXAM:  VITAL SIGNS: ED Triage Vitals  Enc Vitals Group     BP 03/11/17 0951 103/71     Pulse Rate 03/11/17 0948 60     Resp 03/11/17 0948 15     Temp 03/11/17 0948 97.6 F (36.4 C)     Temp Source 03/11/17 0948 Oral     SpO2 03/11/17 0948 97 %     Weight 03/11/17 0949 240 lb (108.9 kg)     Height 03/11/17 0949 6\' 4"  (1.93 m)     Head Circumference --      Peak Flow --      Pain Score --      Pain Loc --      Pain Edu? --      Excl. in [place]? --      Vital signs reviewed, nursing assessments reviewed.   Constitutional:   Alert and oriented. Well appearing and in no distress. Eyes:   No scleral icterus. No conjunctival pallor. PERRL. EOMI.  No nystagmus. ENT   Head:   Normocephalic and atraumatic.   Nose:   No congestion/rhinnorhea. No septal hematoma   Mouth/Throat:   MMM, no pharyngeal erythema. No peritonsillar mass.    Neck:   No stridor. No SubQ emphysema. No meningismus. Hematological/Lymphatic/Immunilogical:   No cervical lymphadenopathy. Cardiovascular:   RRR, Rate 60. Symmetric bilateral radial and DP pulses.  No murmurs.  Respiratory:   Normal respiratory effort without tachypnea nor retractions. Breath sounds are clear and equal bilaterally. No wheezes/rales/rhonchi. Gastrointestinal:   Soft and nontender.  Non distended. There is no CVA tenderness.  No rebound, rigidity, or guarding. Genitourinary:   deferred Musculoskeletal:   Normal range of motion in all extremities. No joint effusions.  No lower extremity tenderness.  No edema. Neurologic:   Normal speech and language.  CN 2-10 normal. Motor grossly intact. No gross focal neurologic deficits are appreciated.  Skin:    Skin is warm, dry and intact. No rash noted.  No petechiae, purpura, or bullae.  ____________________________________________    LABS (pertinent positives/negatives) (all labs ordered are listed, but only abnormal results are displayed) Labs Reviewed  BASIC METABOLIC PANEL - Abnormal; Notable for the following:       Result Value   Glucose, Bld 108 (*)    BUN 24 (*)    Calcium 8.4 (*)    All other components within normal limits  CBC - Abnormal; Notable for the following:    RBC 4.08 (*)    HCT 39.9 (*)    All other components within normal limits  URINALYSIS, COMPLETE (UACMP) WITH MICROSCOPIC - Abnormal; Notable for the following:    Color, Urine AMBER (*)    APPearance HAZY (*)    Protein, ur 100 (*)    All other  components within normal limits  GLUCOSE, CAPILLARY - Abnormal; Notable for the following:    Glucose-Capillary 104 (*)    All other components within normal limits  TROPONIN I  TROPONIN I  CBG MONITORING, ED   ____________________________________________   EKG  Interpreted by me Paced rhythm, rate of 60. Left axis, left bundle branch block. No acute ischemic changes.  ____________________________________________    RADIOLOGY  No results found.  ____________________________________________   PROCEDURES Procedures  ____________________________________________   INITIAL IMPRESSION / ASSESSMENT AND PLAN / ED COURSE  Pertinent labs & imaging results that were available during my care of the patient were reviewed by me and considered in my medical decision making (see chart for details).       Clinical Course as of Mar 11 1401  Wed Mar 11, 2017  1022 D/w Dr. Yves Dill. Reaffirms syncope in office, suspects pt had stress reaction due to anticipation of procedure in office today.   [PS]    Clinical Course User Index [PS] Carrie Mew, MD     ----------------------------------------- 2:10 PM on 03/11/2017 -----------------------------------------  Workup negative. Vital signs remained normal in the ED. Likely a vagal reaction due to anticipation of his procedure.Considering the patient's symptoms, medical history, and physical examination today, I have low suspicion for ACS, PE, TAD, pneumothorax, carditis, mediastinitis, pneumonia, CHF, or sepsis.  Low suspicion for stroke meningitis encephalitis.  ____________________________________________   FINAL CLINICAL IMPRESSION(S) / ED DIAGNOSES  Final diagnoses:  Syncope, unspecified syncope type  Vagal reaction      New Prescriptions   No medications on file     Portions of this note were generated with dragon dictation software. Dictation errors may occur despite best attempts at proofreading.     Carrie Mew, MD 03/11/17 [phone]

## 2017-03-11 NOTE — Discharge Instructions (Signed)
Your EKG and blood tests were unremarkable today. Follow-up with your primary care doctor tomorrow.

## 2017-03-18 DIAGNOSIS — Z0000 Encounter for general adult medical examination without abnormal findings: Secondary | ICD-10-CM | POA: Insufficient documentation

## 2017-04-08 ENCOUNTER — Telehealth: Payer: Self-pay | Admitting: Cardiology

## 2017-04-08 NOTE — Telephone Encounter (Signed)
Patient called and stated that he is going to have procedure next Wednesday Apr 15, 2017. He wants to know if it is ok to hold his Tikosyn that day? Please call him back at his home number.He is aware that it may be Thursday May 24 before he receives a call.

## 2017-04-09 ENCOUNTER — Telehealth: Payer: Self-pay | Admitting: *Deleted

## 2017-04-09 NOTE — Telephone Encounter (Signed)
Patient called to ensure that he can send his remote transmission on 5/29 anytime during the day.  Advised patient that this is fine, but if it's not received by noon we will call to remind him.  Patient verbalizes understanding and appreciation.

## 2017-04-09 NOTE — Telephone Encounter (Signed)
I spoke with Mrs. Cavenaugh- she states the patient is scheduled for a urologic procedure next week with Dr. Yves Dill and Dr. Yves Dill has requested the patient hold his tikosyn due to low bp. I advised Mrs. Coke that Tikosyn does not typically effect blood pressure.  The patient's metoprolol has been discontinued due to low bp's. The patient previously went in for a urologic procedure before, but had taken several medications that may have suppressed his blood pressure to the point he passed out.  She states that the urologic procedure is at 10 am and should last about 10 minutes. She inquired if the patient could take his Tikosyn around 10:30 am that day. I advised it would be fine to take the morning dose a little later and push the evening dose that day back about an hour.  He will then be able to get back on his normal schedule the following day. Mrs. Scioneaux is agreeable and verbalizes understanding.

## 2017-04-14 ENCOUNTER — Ambulatory Visit (INDEPENDENT_AMBULATORY_CARE_PROVIDER_SITE_OTHER): Payer: Medicare Other | Admitting: *Deleted

## 2017-04-14 DIAGNOSIS — R001 Bradycardia, unspecified: Secondary | ICD-10-CM | POA: Diagnosis not present

## 2017-04-14 NOTE — Progress Notes (Signed)
Remote pacemaker transmission.   

## 2017-04-15 LAB — CUP PACEART REMOTE DEVICE CHECK
Battery Remaining Longevity: 98 mo
Battery Voltage: 3.01 V
Brady Statistic AP VS Percent: 47.72 %
Brady Statistic RA Percent Paced: 53.46 %
Brady Statistic RV Percent Paced: 12.37 %
Date Time Interrogation Session: 20180529120001
Implantable Lead Implant Date: 20160921
Implantable Lead Location: 753859
Implantable Lead Location: 753860
Implantable Lead Model: 5076
Implantable Pulse Generator Implant Date: 20160921
Lead Channel Impedance Value: 456 Ohm
Lead Channel Pacing Threshold Pulse Width: 0.4 ms
Lead Channel Sensing Intrinsic Amplitude: 10 mV
Lead Channel Sensing Intrinsic Amplitude: 10 mV
Lead Channel Sensing Intrinsic Amplitude: 2.875 mV
Lead Channel Sensing Intrinsic Amplitude: 2.875 mV
Lead Channel Setting Sensing Sensitivity: 2.8 mV
MDC IDC LEAD IMPLANT DT: 20160921
MDC IDC MSMT LEADCHNL RA IMPEDANCE VALUE: 361 Ohm
MDC IDC MSMT LEADCHNL RA PACING THRESHOLD AMPLITUDE: 0.375 V
MDC IDC MSMT LEADCHNL RV IMPEDANCE VALUE: 323 Ohm
MDC IDC MSMT LEADCHNL RV IMPEDANCE VALUE: 399 Ohm
MDC IDC MSMT LEADCHNL RV PACING THRESHOLD AMPLITUDE: 0.875 V
MDC IDC MSMT LEADCHNL RV PACING THRESHOLD PULSEWIDTH: 0.4 ms
MDC IDC SET LEADCHNL RA PACING AMPLITUDE: 2 V
MDC IDC SET LEADCHNL RV PACING AMPLITUDE: 2.5 V
MDC IDC SET LEADCHNL RV PACING PULSEWIDTH: 0.4 ms
MDC IDC STAT BRADY AP VP PERCENT: 8.66 %
MDC IDC STAT BRADY AS VP PERCENT: 3.25 %
MDC IDC STAT BRADY AS VS PERCENT: 40.37 %

## 2017-04-17 ENCOUNTER — Encounter: Payer: Self-pay | Admitting: Cardiology

## 2017-07-14 ENCOUNTER — Ambulatory Visit (INDEPENDENT_AMBULATORY_CARE_PROVIDER_SITE_OTHER): Payer: Medicare Other | Admitting: *Deleted

## 2017-07-14 DIAGNOSIS — R001 Bradycardia, unspecified: Secondary | ICD-10-CM | POA: Diagnosis not present

## 2017-07-14 NOTE — Progress Notes (Signed)
Remote pacemaker transmission.   

## 2017-07-15 LAB — CUP PACEART REMOTE DEVICE CHECK
Brady Statistic AP VP Percent: 0.83 %
Brady Statistic AP VS Percent: 54.19 %
Brady Statistic AS VP Percent: 0.71 %
Brady Statistic AS VS Percent: 44.27 %
Brady Statistic RV Percent Paced: 1.71 %
Date Time Interrogation Session: 20180828140206
Implantable Lead Implant Date: 20160921
Implantable Lead Location: 753859
Implantable Lead Model: 5076
Implantable Pulse Generator Implant Date: 20160921
Lead Channel Impedance Value: 361 Ohm
Lead Channel Impedance Value: 361 Ohm
Lead Channel Impedance Value: 418 Ohm
Lead Channel Impedance Value: 456 Ohm
Lead Channel Pacing Threshold Amplitude: 0.75 V
Lead Channel Pacing Threshold Pulse Width: 0.4 ms
Lead Channel Sensing Intrinsic Amplitude: 10.25 mV
Lead Channel Sensing Intrinsic Amplitude: 10.25 mV
Lead Channel Sensing Intrinsic Amplitude: 2.75 mV
Lead Channel Setting Pacing Amplitude: 2 V
Lead Channel Setting Pacing Amplitude: 2.5 V
Lead Channel Setting Sensing Sensitivity: 2.8 mV
MDC IDC LEAD IMPLANT DT: 20160921
MDC IDC LEAD LOCATION: 753860
MDC IDC MSMT BATTERY REMAINING LONGEVITY: 96 mo
MDC IDC MSMT BATTERY VOLTAGE: 3.01 V
MDC IDC MSMT LEADCHNL RA PACING THRESHOLD AMPLITUDE: 0.375 V
MDC IDC MSMT LEADCHNL RA PACING THRESHOLD PULSEWIDTH: 0.4 ms
MDC IDC MSMT LEADCHNL RA SENSING INTR AMPL: 2.75 mV
MDC IDC SET LEADCHNL RV PACING PULSEWIDTH: 0.4 ms
MDC IDC STAT BRADY RA PERCENT PACED: 53.97 %

## 2017-07-17 ENCOUNTER — Encounter: Payer: Medicare Other | Admitting: Internal Medicine

## 2017-07-22 ENCOUNTER — Ambulatory Visit (INDEPENDENT_AMBULATORY_CARE_PROVIDER_SITE_OTHER): Payer: Medicare Other | Admitting: Internal Medicine

## 2017-07-22 ENCOUNTER — Encounter: Payer: Self-pay | Admitting: Internal Medicine

## 2017-07-22 ENCOUNTER — Encounter: Payer: Self-pay | Admitting: *Deleted

## 2017-07-22 VITALS — BP 120/70 | HR 63 | Ht 76.0 in | Wt 206.4 lb

## 2017-07-22 DIAGNOSIS — R001 Bradycardia, unspecified: Secondary | ICD-10-CM | POA: Diagnosis not present

## 2017-07-22 DIAGNOSIS — I480 Paroxysmal atrial fibrillation: Secondary | ICD-10-CM

## 2017-07-22 NOTE — Patient Instructions (Addendum)
Medication Instructions:  Your physician recommends that you continue on your current medications as directed. Please refer to the Current Medication list given to you today.   Labwork: BMET, Magnesium  Testing/Procedures: None ordered  Follow-Up: Remote monitoring is used to monitor your Pacemaker from home. This monitoring reduces the number of office visits required to check your device to one time per year. It allows [place] to keep an eye on the functioning of your device to ensure it is working properly. You are scheduled for a device check from home on 10/21/17. You may send your transmission at any time that day. If you have a wireless device, the transmission will be sent automatically. After your physician reviews your transmission, you will receive a postcard with your next transmission date.    Your physician wants you to follow-up in: 6 months with Tommye Standard. You will receive a reminder letter in the mail two months in advance. If you don't receive a letter, please call our office to schedule the follow-up appointment.   Any Other Special Instructions Will Be Listed Below (If Applicable).     If you need a refill on your cardiac medications before your next appointment, please call your pharmacy.

## 2017-07-22 NOTE — Progress Notes (Signed)
Patient Care Team: [name], MD as PCP - General (Internal Medicine)   HPI  Grant Saintjean. is a 76 y.o. male Seen in followup for syncope with Wide QRS Tachycardia felt to be VT , noninducibility at EPS with LV dysfunction; cath normal CA 2/15    He has had problems with Afib and RVR as well as sinus bradycardia.   Syncope was associated with profound bradycardia; monitoring demonstrated subsequent control of atrial fibrillation rates  9/16 he was admitted to hospital with implantation of pacemaker and initiation of dofetilide.    Date Cr K Mg  5/17  0.8 4.3    4/18  0.94 4.4 2.1     Echocardiogram 2/15 and ejection fraction of 35-40% Echocardiogram 5/16 demonstrated significant interval improvement with an EF of 55-60%.  The patient denies chest pain, shortness of breath, nocturnal dyspnea, orthopnea or peripheral edema.  There have been no palpitations, lightheadedness or syncope.  Off to [place] in October  Past Medical History:  Diagnosis Date  . Persistent atrial fibrillation ([place])    a. on tikosyn   . Sinus bradycardia    a. s/p MDT PPM  . Syncope   . Ventricular tachycardia (paroxysmal)     Past Surgical History:  Procedure Laterality Date  . ELECTROPHYSIOLOGY STUDY  01-08-14   negative EPS by Dr Lovena Le following syncopal episode and documented WCT  . ELECTROPHYSIOLOGY STUDY N/A 01/09/2014   Procedure: ELECTROPHYSIOLOGY STUDY +/- ICD, +/- ILR;  Surgeon: Evans Lance, MD;  Location: [facility] CATH LAB;  Service: Cardiovascular;  Laterality: N/A;  . EP IMPLANTABLE DEVICE N/A 08/08/2015   Procedure: Pacemaker Implant;  Surgeon: Deboraha Sprang, MD;  Location: [place] CV LAB;  Service: Cardiovascular;  Laterality: N/A;  . EP IMPLANTABLE DEVICE N/A 08/08/2015   Procedure: Loop Recorder Removal;  Surgeon: Deboraha Sprang, MD;  Location: [place] CV LAB;  Service: Cardiovascular;  Laterality: N/A;  . FINGER SURGERY     Noted the only time patient has  been admitted overnight.    Marland Kitchen LEFT HEART CATHETERIZATION WITH CORONARY ANGIOGRAM N/A 01/06/2014   Procedure: LEFT HEART CATHETERIZATION WITH CORONARY ANGIOGRAM;  Surgeon: Leonie Man, MD;  Location: [facility] CATH LAB;  Service: Cardiovascular;  Laterality: N/A;  . LOOP RECORDER IMPLANT  01-08-14   MDT LinQ implanted by Dr Lovena Le for syncope and WCT    Current Outpatient Prescriptions  Medication Sig Dispense Refill  . apixaban (ELIQUIS) 5 MG TABS tablet Take 1 tablet (5 mg total) by mouth 2 (two) times daily. 180 tablet 3  . Cholecalciferol (VITAMIN D3) 1000 units CAPS Take 1,000 Units by mouth daily.     Marland Kitchen dofetilide (TIKOSYN) 500 MCG capsule Take 1 capsule (500 mcg total) by mouth 2 (two) times daily. 180 capsule 3  . magnesium oxide (MAG-OX) 400 MG tablet Take 1 tablet (400 mg total) by mouth daily. 30 tablet 6  . timolol (TIMOPTIC) 0.5 % ophthalmic solution Place 1 drop into the right eye at bedtime.    . vitamin B-12 (CYANOCOBALAMIN) 1000 MCG tablet Take 1,000 mcg by mouth daily.     No current facility-administered medications for this visit.     No Known Allergies  Review of Systems negative except from HPI and PMH  Physical Exam BP 120/70   Pulse 63   Ht 6\' 4"  (1.93 m)   Wt 206 lb 6.4 oz (93.6 kg)   SpO2 97%   BMI 25.12 kg/m  Well developed and nourished in no acute distress HENT normal Neck supple with JVP-flat Carotids brisk and full without bruits Clear Regular rate and rhythm, no murmurs or gallops Abd-soft with active BS without hepatomegaly No Clubbing cyanosis edema Skin-warm and dry A & Oriented  Grossly normal sensory and motor function   ECG Sinus at 63 intervals 18/12/43 Otherwise normal  Assessment and  Plan  Syncope.   Atrial fibrillation persistent  Sinus bradycardia/Chronotropic incompetence  Wide QRS tachycardia  PVCs  Congestive heart failure-chronic diastolic  Nonischemic cardiomyopathy normalized  Pacemaker-Medtronic  The patient's  device was interrogated.  The information was reviewed. No changes were made in the programming.     High risk medication-dofetilide  On Anticoagulation;  No bleeding issues   Euvolemic continue current meds  No syncope

## 2017-07-23 LAB — BASIC METABOLIC PANEL
BUN / CREAT RATIO: 29 — AB (ref 10–24)
BUN: 24 mg/dL (ref 8–27)
CALCIUM: 8.8 mg/dL (ref 8.6–10.2)
CHLORIDE: 104 mmol/L (ref 96–106)
CO2: 23 mmol/L (ref 20–29)
Creatinine, Ser: 0.84 mg/dL (ref 0.76–1.27)
GFR calc Af Amer: 98 mL/min/{1.73_m2} (ref >59)
GFR calc non Af Amer: 85 mL/min/{1.73_m2} (ref >59)
Glucose: 97 mg/dL (ref 65–99)
Potassium: 4.5 mmol/L (ref 3.5–5.2)
Sodium: 140 mmol/L (ref 134–144)

## 2017-07-23 LAB — CUP PACEART INCLINIC DEVICE CHECK
Date Time Interrogation Session: 20180906121315
Implantable Lead Implant Date: 20160921
Implantable Lead Location: 753859
Implantable Lead Model: 5076
Implantable Pulse Generator Implant Date: 20160921
MDC IDC LEAD IMPLANT DT: 20160921
MDC IDC LEAD LOCATION: 753860

## 2017-07-23 LAB — MAGNESIUM: MAGNESIUM: 2.3 mg/dL (ref 1.6–2.3)

## 2017-07-24 ENCOUNTER — Encounter: Payer: Self-pay | Admitting: Cardiology

## 2017-10-21 ENCOUNTER — Ambulatory Visit (INDEPENDENT_AMBULATORY_CARE_PROVIDER_SITE_OTHER): Payer: Medicare Other | Admitting: *Deleted

## 2017-10-21 DIAGNOSIS — R001 Bradycardia, unspecified: Secondary | ICD-10-CM | POA: Diagnosis not present

## 2017-10-21 NOTE — Progress Notes (Signed)
Remote pacemaker transmission.   

## 2017-10-22 ENCOUNTER — Encounter: Payer: Self-pay | Admitting: Cardiology

## 2017-10-22 LAB — CUP PACEART REMOTE DEVICE CHECK
Battery Remaining Longevity: 94 mo
Battery Voltage: 3.01 V
Brady Statistic AP VS Percent: 56.04 %
Brady Statistic AS VS Percent: 42.16 %
Brady Statistic RV Percent Paced: 2.08 %
Date Time Interrogation Session: 20181205140935
Implantable Lead Implant Date: 20160921
Implantable Lead Model: 5076
Implantable Lead Model: 5076
Implantable Pulse Generator Implant Date: 20160921
Lead Channel Impedance Value: 418 Ohm
Lead Channel Impedance Value: 456 Ohm
Lead Channel Pacing Threshold Amplitude: 0.375 V
Lead Channel Pacing Threshold Amplitude: 0.875 V
Lead Channel Pacing Threshold Pulse Width: 0.4 ms
Lead Channel Sensing Intrinsic Amplitude: 3.25 mV
Lead Channel Sensing Intrinsic Amplitude: 3.25 mV
Lead Channel Setting Sensing Sensitivity: 2.8 mV
MDC IDC LEAD IMPLANT DT: 20160921
MDC IDC LEAD LOCATION: 753859
MDC IDC LEAD LOCATION: 753860
MDC IDC MSMT LEADCHNL RA IMPEDANCE VALUE: 342 Ohm
MDC IDC MSMT LEADCHNL RA PACING THRESHOLD PULSEWIDTH: 0.4 ms
MDC IDC MSMT LEADCHNL RV IMPEDANCE VALUE: 361 Ohm
MDC IDC MSMT LEADCHNL RV SENSING INTR AMPL: 9.625 mV
MDC IDC MSMT LEADCHNL RV SENSING INTR AMPL: 9.625 mV
MDC IDC SET LEADCHNL RA PACING AMPLITUDE: 2 V
MDC IDC SET LEADCHNL RV PACING AMPLITUDE: 2.5 V
MDC IDC SET LEADCHNL RV PACING PULSEWIDTH: 0.4 ms
MDC IDC STAT BRADY AP VP PERCENT: 1.09 %
MDC IDC STAT BRADY AS VP PERCENT: 0.71 %
MDC IDC STAT BRADY RA PERCENT PACED: 55.14 %

## 2018-01-15 ENCOUNTER — Telehealth: Payer: Self-pay

## 2018-01-15 ENCOUNTER — Telehealth: Payer: Self-pay | Admitting: *Deleted

## 2018-01-15 NOTE — Telephone Encounter (Signed)
Patient called to request a new PPM ID card as he has an upcoming procedure at [facility] (not yet scheduled) and they are requiring the card.  Advised I will order one to his home address and that it will take a few weeks to get to him.  He is agreeable.  Patient also reports that after the surgery he may be placed on an antibiotic per his pre-procedure literature that he has been reviewing.  He requests to speak with Dr. Olin Pia nurse as far as recommendations regarding interactions with Tikosyn.  Advised that the prescribing doctor and his pharmacist would be the best resources at this time as the antibiotic has not been prescribed yet.  Patient still requests a call back.  Advised I will route this message to Dr. Olin Pia nurse, but in the meantime, he should also clarify which antibiotic may be prescribed from his eye doctor and review with his pharmacist.  Patient is agreeable to this plan.

## 2018-01-15 NOTE — Telephone Encounter (Signed)
Patient want a call back to talk about an antibiotic he can take for a procedure that he is having. Please review and advise.

## 2018-01-15 NOTE — Telephone Encounter (Signed)
Spoke with pt regarding his concerns. The medication prescribed for him is Medrol dose pack. Per [employer] there is no drug interactions between Medrol and Tikosyn. I encouraged him to double check with his pharmacist and to talk to Dr Caryl Comes about it when he comes in for his visit 3/20. I also encouraged him to ask his surgeon what type of antibiotic they may use during his procedure. Grant Moreno was appreciative and had no further questions.

## 2018-01-15 NOTE — Telephone Encounter (Signed)
Please refer to earlier encounter.

## 2018-01-20 ENCOUNTER — Encounter: Payer: Self-pay | Admitting: Internal Medicine

## 2018-01-20 ENCOUNTER — Ambulatory Visit (INDEPENDENT_AMBULATORY_CARE_PROVIDER_SITE_OTHER): Payer: Medicare Other | Admitting: *Deleted

## 2018-01-20 DIAGNOSIS — R001 Bradycardia, unspecified: Secondary | ICD-10-CM

## 2018-01-20 NOTE — Progress Notes (Signed)
Remote pacemaker transmission.   

## 2018-01-21 ENCOUNTER — Other Ambulatory Visit: Payer: Self-pay | Admitting: Internal Medicine

## 2018-01-21 ENCOUNTER — Encounter: Payer: Self-pay | Admitting: Cardiology

## 2018-01-21 NOTE — Telephone Encounter (Signed)
Pt's medication was sent to pt's pharmacy as requested. Confirmation received.  °

## 2018-01-24 LAB — CUP PACEART REMOTE DEVICE CHECK
Battery Remaining Longevity: 92 mo
Battery Voltage: 3.01 V
Brady Statistic AS VP Percent: 5.41 %
Brady Statistic AS VS Percent: 36.8 %
Brady Statistic RA Percent Paced: 54.85 %
Date Time Interrogation Session: 20190306141234
Implantable Lead Implant Date: 20160921
Implantable Lead Location: 753860
Implantable Lead Model: 5076
Implantable Pulse Generator Implant Date: 20160921
Lead Channel Impedance Value: 380 Ohm
Lead Channel Pacing Threshold Amplitude: 0.375 V
Lead Channel Pacing Threshold Pulse Width: 0.4 ms
Lead Channel Pacing Threshold Pulse Width: 0.4 ms
Lead Channel Sensing Intrinsic Amplitude: 2.75 mV
Lead Channel Sensing Intrinsic Amplitude: 2.75 mV
Lead Channel Setting Sensing Sensitivity: 2.8 mV
MDC IDC LEAD IMPLANT DT: 20160921
MDC IDC LEAD LOCATION: 753859
MDC IDC MSMT LEADCHNL RA IMPEDANCE VALUE: 494 Ohm
MDC IDC MSMT LEADCHNL RV IMPEDANCE VALUE: 342 Ohm
MDC IDC MSMT LEADCHNL RV IMPEDANCE VALUE: 418 Ohm
MDC IDC MSMT LEADCHNL RV PACING THRESHOLD AMPLITUDE: 0.875 V
MDC IDC MSMT LEADCHNL RV SENSING INTR AMPL: 10.625 mV
MDC IDC MSMT LEADCHNL RV SENSING INTR AMPL: 10.625 mV
MDC IDC SET LEADCHNL RA PACING AMPLITUDE: 2 V
MDC IDC SET LEADCHNL RV PACING AMPLITUDE: 2.5 V
MDC IDC SET LEADCHNL RV PACING PULSEWIDTH: 0.4 ms
MDC IDC STAT BRADY AP VP PERCENT: 8.43 %
MDC IDC STAT BRADY AP VS PERCENT: 49.36 %
MDC IDC STAT BRADY RV PERCENT PACED: 14.53 %

## 2018-02-03 ENCOUNTER — Ambulatory Visit (INDEPENDENT_AMBULATORY_CARE_PROVIDER_SITE_OTHER): Payer: Medicare Other | Admitting: Internal Medicine

## 2018-02-03 ENCOUNTER — Encounter: Payer: Self-pay | Admitting: Internal Medicine

## 2018-02-03 VITALS — BP 142/80 | HR 62 | Ht 76.0 in | Wt 212.0 lb

## 2018-02-03 DIAGNOSIS — I472 Ventricular tachycardia, unspecified: Secondary | ICD-10-CM

## 2018-02-03 DIAGNOSIS — R001 Bradycardia, unspecified: Secondary | ICD-10-CM | POA: Diagnosis not present

## 2018-02-03 DIAGNOSIS — I480 Paroxysmal atrial fibrillation: Secondary | ICD-10-CM | POA: Diagnosis not present

## 2018-02-03 DIAGNOSIS — Z959 Presence of cardiac and vascular implant and graft, unspecified: Secondary | ICD-10-CM

## 2018-02-03 LAB — CUP PACEART INCLINIC DEVICE CHECK
Battery Remaining Longevity: 91 mo
Battery Voltage: 3.01 V
Brady Statistic AP VS Percent: 52.67 %
Brady Statistic AS VS Percent: 39.41 %
Date Time Interrogation Session: 20190320142939
Implantable Lead Implant Date: 20160921
Implantable Lead Location: 753860
Implantable Lead Model: 5076
Lead Channel Pacing Threshold Amplitude: 0.375 V
Lead Channel Sensing Intrinsic Amplitude: 10.25 mV
Lead Channel Sensing Intrinsic Amplitude: 3.25 mV
Lead Channel Sensing Intrinsic Amplitude: 4.375 mV
Lead Channel Setting Pacing Amplitude: 2.5 V
MDC IDC LEAD IMPLANT DT: 20160921
MDC IDC LEAD LOCATION: 753859
MDC IDC MSMT LEADCHNL RA IMPEDANCE VALUE: 361 Ohm
MDC IDC MSMT LEADCHNL RA IMPEDANCE VALUE: 475 Ohm
MDC IDC MSMT LEADCHNL RA PACING THRESHOLD PULSEWIDTH: 0.4 ms
MDC IDC MSMT LEADCHNL RV IMPEDANCE VALUE: 304 Ohm
MDC IDC MSMT LEADCHNL RV IMPEDANCE VALUE: 399 Ohm
MDC IDC MSMT LEADCHNL RV PACING THRESHOLD AMPLITUDE: 1.125 V
MDC IDC MSMT LEADCHNL RV PACING THRESHOLD PULSEWIDTH: 0.4 ms
MDC IDC MSMT LEADCHNL RV SENSING INTR AMPL: 11.75 mV
MDC IDC PG IMPLANT DT: 20160921
MDC IDC SET LEADCHNL RA PACING AMPLITUDE: 2 V
MDC IDC SET LEADCHNL RV PACING PULSEWIDTH: 0.4 ms
MDC IDC SET LEADCHNL RV SENSING SENSITIVITY: 2.8 mV
MDC IDC STAT BRADY AP VP PERCENT: 4.79 %
MDC IDC STAT BRADY AS VP PERCENT: 3.12 %
MDC IDC STAT BRADY RA PERCENT PACED: 55.03 %
MDC IDC STAT BRADY RV PERCENT PACED: 8.47 %

## 2018-02-03 MED ORDER — APIXABAN 5 MG PO TABS: 5.0000 mg | ORAL_TABLET | Freq: Two times a day (BID) | ORAL | 3 refills | Status: DC

## 2018-02-03 MED ORDER — DOFETILIDE 500 MCG PO CAPS: 500.0000 ug | ORAL_CAPSULE | Freq: Two times a day (BID) | ORAL | 3 refills | Status: DC

## 2018-02-03 NOTE — Progress Notes (Signed)
Patient Care Team: [name], MD as PCP - General (Internal Medicine)   HPI  Grant Geier. is a 77 y.o. male Seen in followup for syncope with Wide QRS Tachycardia felt to be VT , noninducibility at EPS with LV dysfunction; cath normal CA 2/15    He has had problems with Afib and RVR as well as sinus bradycardia.   Syncope was associated with profound bradycardia; monitoring demonstrated subsequent control of atrial fibrillation rates  9/16 he was admitted to hospital with implantation of pacemaker and initiation of dofetilide.    Date Cr K Mg Hgb  5/17  0.8 4.3     4/18  0.94 4.4 2.1 13.7  9/18 0.84 4.5 2.3           DATE TEST EF   2/15 Echo   35-40 %   5/16 Echo   55-60 %         The patient denies chest pain, shortness of breath, nocturnal dyspnea, orthopnea or peripheral edema.  There have been no palpitations, lightheadedness or syncope.   Going to [place]   Past Medical History:  Diagnosis Date  . Persistent atrial fibrillation ([place])    a. on tikosyn   . Sinus bradycardia    a. s/p MDT PPM  . Syncope   . Ventricular tachycardia (paroxysmal)     Past Surgical History:  Procedure Laterality Date  . ELECTROPHYSIOLOGY STUDY  01-08-14   negative EPS by Dr Lovena Le following syncopal episode and documented WCT  . ELECTROPHYSIOLOGY STUDY N/A 01/09/2014   Procedure: ELECTROPHYSIOLOGY STUDY +/- ICD, +/- ILR;  Surgeon: Evans Lance, MD;  Location: [facility] CATH LAB;  Service: Cardiovascular;  Laterality: N/A;  . EP IMPLANTABLE DEVICE N/A 08/08/2015   Procedure: Pacemaker Implant;  Surgeon: Deboraha Sprang, MD;  Location: [place] CV LAB;  Service: Cardiovascular;  Laterality: N/A;  . EP IMPLANTABLE DEVICE N/A 08/08/2015   Procedure: Loop Recorder Removal;  Surgeon: Deboraha Sprang, MD;  Location: [place] CV LAB;  Service: Cardiovascular;  Laterality: N/A;  . FINGER SURGERY     Noted the only time patient has been admitted overnight.    Marland Kitchen LEFT HEART  CATHETERIZATION WITH CORONARY ANGIOGRAM N/A 01/06/2014   Procedure: LEFT HEART CATHETERIZATION WITH CORONARY ANGIOGRAM;  Surgeon: Leonie Man, MD;  Location: [facility] CATH LAB;  Service: Cardiovascular;  Laterality: N/A;  . LOOP RECORDER IMPLANT  01-08-14   MDT LinQ implanted by Dr Lovena Le for syncope and WCT    Current Outpatient Medications  Medication Sig Dispense Refill  . apixaban (ELIQUIS) 5 MG TABS tablet Take 1 tablet (5 mg total) by mouth 2 (two) times daily. 180 tablet 3  . Cholecalciferol (VITAMIN D3) 1000 units CAPS Take 1,000 Units by mouth daily.     Marland Kitchen dofetilide (TIKOSYN) 500 MCG capsule TAKE 1 CAPSULE BY MOUTH TWICE DAILY 180 capsule 1  . magnesium oxide (MAG-OX) 400 MG tablet Take 1 tablet (400 mg total) by mouth daily. 30 tablet 6  . timolol (TIMOPTIC) 0.5 % ophthalmic solution Place 1 drop into the right eye at bedtime.    . vitamin B-12 (CYANOCOBALAMIN) 1000 MCG tablet Take 1,000 mcg by mouth daily.     No current facility-administered medications for this visit.     No Known Allergies  Review of Systems negative except from HPI and PMH  Physical Exam BP (!) 142/80   Pulse 62   Ht 6\' 4"  (1.93 m)  Wt 212 lb (96.2 kg)   SpO2 99%   BMI 25.81 kg/m  Well developed and nourished in no acute distress HENT normal Neck supple with JVP-flat Clear Regular rate and rhythm, no murmurs or gallops Abd-soft with active BS No Clubbing cyanosis edema Skin-warm and dry A & Oriented  Grossly normal sensory and motor function    ECG  Sinus  18/13/45   Assessment and  Plan  Syncope.   Atrial fibrillation persistent  Sinus bradycardia/Chronotropic incompetence  Wide QRS tachycardia  PVCs  Congestive heart failure-chronic diastolic  Nonischemic cardiomyopathy resolved   Pacemaker-Medtronic  The patient's device was interrogated.  The information was reviewed. No changes were made in the programming.     High risk medication-dofetilide   Tolerating  dofetilide  No intercurrent Ventricular tachycardia  On Anticoagulation;  No bleeding issues   No intercurrent atrial fibrillation or flutter  No syncope  Check surveillance labs and CBC     Euvolemic continue current meds

## 2018-02-03 NOTE — Patient Instructions (Addendum)
Medication Instructions:  Your physician recommends that you continue on your current medications as directed. Please refer to the Current Medication list given to you today.  Labwork: Your physician recommends that you have the following lab work drawn:   CBC, BMP, Mg  Testing/Procedures: None ordered.  Follow-Up: Your physician recommends that you schedule a follow-up appointment in:   6 months with Dr Caryl Comes   Remote monitoring is used to monitor your Pacemaker of ICD from home. This monitoring reduces the number of office visits required to check your device to one time per year. It allows [place] to keep an eye on the functioning of your device to ensure it is working properly. You are scheduled for a device check from home on 04/21/2018. You may send your transmission at any time that day. If you have a wireless device, the transmission will be sent automatically. After your physician reviews your transmission, you will receive a postcard with your next transmission date.   Any Other Special Instructions Will Be Listed Below (If Applicable).     If you need a refill on your cardiac medications before your next appointment, please call your pharmacy.

## 2018-02-04 LAB — CBC WITH DIFFERENTIAL/PLATELET
Basophils Absolute: 0 10*3/uL (ref 0.0–0.2)
Basos: 0 %
EOS (ABSOLUTE): 0.2 10*3/uL (ref 0.0–0.4)
EOS: 3 %
HEMATOCRIT: 43.9 % (ref 37.5–51.0)
Hemoglobin: 14.4 g/dL (ref 13.0–17.7)
Immature Grans (Abs): 0 10*3/uL (ref 0.0–0.1)
Immature Granulocytes: 0 %
LYMPHS ABS: 2.2 10*3/uL (ref 0.7–3.1)
Lymphs: 25 %
MCH: 33.3 pg — ABNORMAL HIGH (ref 26.6–33.0)
MCHC: 32.8 g/dL (ref 31.5–35.7)
MCV: 101 fL — AB (ref 79–97)
MONOS ABS: 0.9 10*3/uL (ref 0.1–0.9)
Monocytes: 10 %
Neutrophils Absolute: 5.7 10*3/uL (ref 1.4–7.0)
Neutrophils: 62 %
PLATELETS: 244 10*3/uL (ref 150–379)
RBC: 4.33 x10E6/uL (ref 4.14–5.80)
RDW: 13.1 % (ref 12.3–15.4)
WBC: 9 10*3/uL (ref 3.4–10.8)

## 2018-02-04 LAB — BASIC METABOLIC PANEL
BUN/Creatinine Ratio: 21 (ref 10–24)
BUN: 18 mg/dL (ref 8–27)
CALCIUM: 9 mg/dL (ref 8.6–10.2)
CHLORIDE: 103 mmol/L (ref 96–106)
CO2: 22 mmol/L (ref 20–29)
Creatinine, Ser: 0.85 mg/dL (ref 0.76–1.27)
GFR calc Af Amer: 97 mL/min/{1.73_m2} (ref >59)
GFR, EST NON AFRICAN AMERICAN: 84 mL/min/{1.73_m2} (ref >59)
GLUCOSE: 99 mg/dL (ref 65–99)
POTASSIUM: 4.5 mmol/L (ref 3.5–5.2)
SODIUM: 140 mmol/L (ref 134–144)

## 2018-02-04 LAB — MAGNESIUM: Magnesium: 2.3 mg/dL (ref 1.6–2.3)

## 2018-05-17 ENCOUNTER — Ambulatory Visit (INDEPENDENT_AMBULATORY_CARE_PROVIDER_SITE_OTHER): Payer: Medicare Other | Admitting: *Deleted

## 2018-05-17 DIAGNOSIS — I429 Cardiomyopathy, unspecified: Secondary | ICD-10-CM

## 2018-05-17 DIAGNOSIS — I472 Ventricular tachycardia, unspecified: Secondary | ICD-10-CM

## 2018-05-17 NOTE — Progress Notes (Signed)
Remote pacemaker transmission.   

## 2018-05-18 ENCOUNTER — Telehealth: Payer: Self-pay | Admitting: *Deleted

## 2018-05-18 NOTE — Telephone Encounter (Signed)
   [facility] Pre-operative Risk Assessment    Request for surgical clearance:  1. What type of surgery is being performed? BIL LEVATOR ADVANCEMENT WITH BROW LIFT   2. When is this surgery scheduled? 06/01/18   3. What type of clearance is required (medical clearance vs. Pharmacy clearance to hold med vs. Both)? BOTH  4. Are there any medications that need to be held prior to surgery and how long? ELIQUIS   5. Practice name and name of physician performing surgery? [facility] --AMY FOWLER MD   6. What is your office phone number [phone]    7.   What is your office fax number [phone]  8.   Anesthesia type (None, local, MAC, general) ? NONE SPECIFIED   Grant Moreno M 05/18/2018, 5:09 PM  _________________________________________________________________   (provider comments below)

## 2018-05-19 NOTE — Telephone Encounter (Signed)
Pharm to address.

## 2018-05-21 NOTE — Telephone Encounter (Signed)
   Primary Cardiologist: Dr Caryl Comes  Chart reviewed as part of pre-operative protocol coverage. Given past medical history, my conversations with [name] this morning, and time since last visit, based on ACC/AHA guidelines, Grant Moreno. would be at acceptable risk for the planned procedure without further cardiovascular testing.   OK to hold Eliquis 24 hours pre op.   I will route this recommendation to the requesting party via Epic fax function and remove from pre-op pool.  Please call with questions.  Kerin Ransom, PA-C 05/21/2018, 8:58 AM

## 2018-05-21 NOTE — Telephone Encounter (Signed)
Patient with diagnosis of Afib on Eliquis for anticoagulation.    Procedure: BIL LEVATOR ADVANCEMENT WITH BROW LIFT Date of procedure: 06/01/18  CHADS2-VASc score of  4 (CHF, HTN, AGE, DM2, stroke/tia x 2, CAD, AGE, male)  CrCl 20ml/min  Per office protocol, patient can hold Eliquis for 24 hours prior to procedure.

## 2018-05-25 ENCOUNTER — Encounter: Payer: Self-pay | Admitting: *Deleted

## 2018-05-25 ENCOUNTER — Other Ambulatory Visit: Payer: Self-pay

## 2018-05-25 NOTE — Discharge Instructions (Signed)
INSTRUCTIONS FOLLOWING OCULOPLASTIC SURGERY °AMY M. FOWLER, MD ° °AFTER YOUR EYE SURGERY, THER ARE MANY THINGS THWIHC YOU, THE PATIENT, CAN DO TO ASSURE THE BEST POSSIBLE RESULT FROM YOUR OPERATION.  THIS SHEET SHOULD BE REFERRED TO WHENEVER QUESTIONS ARISE.  IF THERE ARE ANY QUESTIONS NOT ANSWERED HERE, DO NOT HESITATE TO CALL OUR OFFICE AT [phone] OR [phone].  THERE IS ALWAYS [facility] AVAILABLE TO CALL IF QUESTIONS OR PROBLEMS ARISE. ° °VISION: Your vision may be blurred and out of focus after surgery until you are able to stop using your ointment, swelling resolves and your eye(s) heal. This may take 1 to 2 weeks at the least.  If your vision becomes gradually more dim or dark, this is not normal and you need to call our office immediately. ° °EYE CARE: For the first 48 hours after surgery, use ice packs frequently - “20 minutes on, 20 minutes off” - to help reduce swelling and bruising.  Small bags of frozen peas or corn make good ice packs along with cloths soaked in ice water.  If you are wearing a patch or other type of dressing following surgery, keep this on for the amount of time specified by your doctor.  For the first week following surgery, you will need to treat your stitches with great care.  If is OK to shower, but take care to not allow soapy water to run into your eye(s) to help reduce changes of infection.  You may gently clean the eyelashes and around the eye(s) with cotton balls and sterile water, BUT DO NOT RUB THE STITCHES VIGOROUSLY.  Keeping your stitches moist with ointment will help promote healing with minimal scar formation. ° °ACTIVITY: When you leave the surgery center, you should go home, rest and be inactive.  The eye(s) may feel scratchy and keeping the eyes closed will allow for faster healing.  The first week following surgery, avoid straining (anything making the face turn red) or lifting over 20 pounds.  Additionally, avoid bending which causes your head to go below  your waist.  Using your eyes will NOT harm them, so feel free to read, watch television, use the computer, etc as desired.  Driving depends on each individual, so check with your doctor if you have questions about driving. ° °MEDICATIONS:  You will be given a prescription for an ointment to use 4 times a day on your stitches.  You can use the ointment in your eyes if they feel scratchy or irritated.  If you eyelid(s) don’t close completely when you sleep, put some ointment in your eyes before bedtime. ° °EMERGENCY: If you experience SEVERE EYE PAIN OR HEADACHE UNRELIEVED BY TYLENOL OR PERCOCET, NAUSEA OR VOMITING, WORSENING REDNESS, OR WORSENING VISION (ESPECIALLY VISION THAT WA INITIALLY BETTER) CALL [phone] OR [phone] DURING BUSINESS HOURS OR AFTER HOURS. ° °General Anesthesia, Adult, Care After °These instructions provide you with information about caring for yourself after your procedure. Your health care provider may also give you more specific instructions. Your treatment has been planned according to current medical practices, but problems sometimes occur. Call your health care provider if you have any problems or questions after your procedure. °What can I expect after the procedure? °After the procedure, it is common to have: °· Vomiting. °· A sore throat. °· Mental slowness. ° °It is common to feel: °· Nauseous. °· Cold or shivery. °· Sleepy. °· Tired. °· Sore or achy, even in parts of your body where you did not have surgery. ° °  Follow these instructions at home: °For at least 24 hours after the procedure: °· Do not: °? Participate in activities where you could fall or become injured. °? Drive. °? Use heavy machinery. °? Drink alcohol. °? Take sleeping pills or medicines that cause drowsiness. °? Make important decisions or sign legal documents. °? Take care of children on your own. °· Rest. °Eating and drinking °· If you vomit, drink water, juice, or soup when you can drink without  vomiting. °· Drink enough fluid to keep your urine clear or pale yellow. °· Make sure you have little or no nausea before eating solid foods. °· Follow the diet recommended by your health care provider. °General instructions °· Have a responsible adult stay with you until you are awake and alert. °· Return to your normal activities as told by your health care provider. Ask your health care provider what activities are safe for you. °· Take over-the-counter and prescription medicines only as told by your health care provider. °· If you smoke, do not smoke without supervision. °· Keep all follow-up visits as told by your health care provider. This is important. °Contact a health care provider if: °· You continue to have nausea or vomiting at home, and medicines are not helpful. °· You cannot drink fluids or start eating again. °· You cannot urinate after 8-12 hours. °· You develop a skin rash. °· You have fever. °· You have increasing redness at the site of your procedure. °Get help right away if: °· You have difficulty breathing. °· You have chest pain. °· You have unexpected bleeding. °· You feel that you are having a life-threatening or urgent problem. °This information is not intended to replace advice given to you by your health care provider. Make sure you discuss any questions you have with your health care provider. °Document Released: 02/09/2001 Document Revised: 04/07/2016 Document Reviewed: 10/18/2015 °Elsevier Interactive Patient Education © 2018 [employer] ° °

## 2018-05-27 LAB — CUP PACEART REMOTE DEVICE CHECK
Battery Remaining Longevity: 87 mo
Brady Statistic AP VS Percent: 59.28 %
Brady Statistic RA Percent Paced: 59.51 %
Brady Statistic RV Percent Paced: 2.85 %
Date Time Interrogation Session: 20190701121042
Implantable Lead Implant Date: 20160921
Implantable Lead Location: 753859
Implantable Lead Location: 753860
Implantable Lead Model: 5076
Lead Channel Impedance Value: 418 Ohm
Lead Channel Pacing Threshold Pulse Width: 0.4 ms
Lead Channel Pacing Threshold Pulse Width: 0.4 ms
Lead Channel Sensing Intrinsic Amplitude: 8 mV
Lead Channel Sensing Intrinsic Amplitude: 8 mV
Lead Channel Setting Pacing Amplitude: 2.5 V
Lead Channel Setting Pacing Pulse Width: 0.4 ms
Lead Channel Setting Sensing Sensitivity: 2.8 mV
MDC IDC LEAD IMPLANT DT: 20160921
MDC IDC MSMT BATTERY VOLTAGE: 3.01 V
MDC IDC MSMT LEADCHNL RA IMPEDANCE VALUE: 380 Ohm
MDC IDC MSMT LEADCHNL RA IMPEDANCE VALUE: 494 Ohm
MDC IDC MSMT LEADCHNL RA PACING THRESHOLD AMPLITUDE: 0.5 V
MDC IDC MSMT LEADCHNL RA SENSING INTR AMPL: 2.5 mV
MDC IDC MSMT LEADCHNL RA SENSING INTR AMPL: 2.5 mV
MDC IDC MSMT LEADCHNL RV IMPEDANCE VALUE: 323 Ohm
MDC IDC MSMT LEADCHNL RV PACING THRESHOLD AMPLITUDE: 0.875 V
MDC IDC PG IMPLANT DT: 20160921
MDC IDC SET LEADCHNL RA PACING AMPLITUDE: 2 V
MDC IDC STAT BRADY AP VP PERCENT: 1.62 %
MDC IDC STAT BRADY AS VP PERCENT: 1 %
MDC IDC STAT BRADY AS VS PERCENT: 38.1 %

## 2018-06-01 ENCOUNTER — Encounter
Admission: RE | Disposition: A | Discharge status: Home / Self Care | Payer: Self-pay | Source: Ambulatory Visit | Attending: Ophthalmology

## 2018-06-01 ENCOUNTER — Ambulatory Visit: Payer: Medicare Other | Admitting: Anesthesiology

## 2018-06-01 ENCOUNTER — Ambulatory Visit
Admission: RE | Admit: 2018-06-01 | Discharge: 2018-06-01 | Disposition: A | Discharge status: Home / Self Care | Payer: Medicare Other | Source: Ambulatory Visit | Attending: Ophthalmology | Admitting: Ophthalmology

## 2018-06-01 DIAGNOSIS — Z87891 Personal history of nicotine dependence: Secondary | ICD-10-CM | POA: Insufficient documentation

## 2018-06-01 DIAGNOSIS — H57813 Brow ptosis, bilateral: Secondary | ICD-10-CM | POA: Insufficient documentation

## 2018-06-01 DIAGNOSIS — Z888 Allergy status to other drugs, medicaments and biological substances status: Secondary | ICD-10-CM | POA: Diagnosis not present

## 2018-06-01 DIAGNOSIS — H02403 Unspecified ptosis of bilateral eyelids: Secondary | ICD-10-CM | POA: Insufficient documentation

## 2018-06-01 DIAGNOSIS — Z950 Presence of cardiac pacemaker: Secondary | ICD-10-CM | POA: Insufficient documentation

## 2018-06-01 DIAGNOSIS — I4891 Unspecified atrial fibrillation: Secondary | ICD-10-CM | POA: Diagnosis not present

## 2018-06-01 DIAGNOSIS — R002 Palpitations: Secondary | ICD-10-CM | POA: Diagnosis not present

## 2018-06-01 HISTORY — PX: PTOSIS REPAIR: SHX6568

## 2018-06-01 HISTORY — PX: BROW PTOSIS: SHX6727

## 2018-06-01 HISTORY — DX: Presence of cardiac pacemaker: Z95.0

## 2018-06-01 SURGERY — REPAIR, BLEPHAROPTOSIS
Anesthesia: Monitor Anesthesia Care | Site: Eye | Laterality: Bilateral | Wound class: Clean

## 2018-06-01 MED ORDER — ALFENTANIL 500 MCG/ML IJ INJ
INJECTION | INTRAVENOUS | Status: DC | PRN
  Administered 2018-06-01: 400 ug via INTRAVENOUS
  Administered 2018-06-01: 600 ug via INTRAVENOUS

## 2018-06-01 MED ORDER — TETRACAINE HCL 0.5 % OP SOLN
OPHTHALMIC | Status: DC | PRN
  Administered 2018-06-01: 2 [drp] via OPHTHALMIC

## 2018-06-01 MED ORDER — BSS IO SOLN
INTRAOCULAR | Status: DC | PRN
  Administered 2018-06-01: 15 mL

## 2018-06-01 MED ORDER — ACETAMINOPHEN 325 MG PO TABS: 650.0000 mg | ORAL_TABLET | Freq: Once | ORAL | Status: DC | PRN

## 2018-06-01 MED ORDER — TRAMADOL HCL 50 MG PO TABS: ORAL_TABLET | ORAL | 0 refills | Status: DC

## 2018-06-01 MED ORDER — LACTATED RINGERS IV SOLN
10.0000 mL/h | INTRAVENOUS | Status: DC
  Administered 2018-06-01: 09:00:00 via INTRAVENOUS
  Administered 2018-06-01: 10 mL/h via INTRAVENOUS

## 2018-06-01 MED ORDER — DEXMEDETOMIDINE HCL 200 MCG/2ML IV SOLN
INTRAVENOUS | Status: DC | PRN
  Administered 2018-06-01 (×5): 4 ug via INTRAVENOUS

## 2018-06-01 MED ORDER — MIDAZOLAM HCL 2 MG/2ML IJ SOLN
INTRAMUSCULAR | Status: DC | PRN
  Administered 2018-06-01 (×2): 1 mg via INTRAVENOUS

## 2018-06-01 MED ORDER — LIDOCAINE-EPINEPHRINE 2 %-1:100000 IJ SOLN
INTRAMUSCULAR | Status: DC | PRN
  Administered 2018-06-01: 6 mL via OPHTHALMIC
  Administered 2018-06-01: 1 mL via OPHTHALMIC

## 2018-06-01 MED ORDER — ERYTHROMYCIN 5 MG/GM OP OINT
TOPICAL_OINTMENT | OPHTHALMIC | Status: DC | PRN
  Administered 2018-06-01: 1 via OPHTHALMIC

## 2018-06-01 MED ORDER — ONDANSETRON HCL 4 MG/2ML IJ SOLN: 4.0000 mg | Freq: Once | INTRAMUSCULAR | Status: DC | PRN

## 2018-06-01 MED ORDER — ERYTHROMYCIN 5 MG/GM OP OINT: TOPICAL_OINTMENT | OPHTHALMIC | 2 refills | Status: DC

## 2018-06-01 SURGICAL SUPPLY — 34 items
APPLICATOR COTTON TIP WD 3 STR (MISCELLANEOUS) ×4 IMPLANT
BLADE SURG 15 STRL LF DISP TIS (BLADE) ×1 IMPLANT
BLADE SURG 15 STRL SS (BLADE) ×1
CORD BIP STRL DISP 12FT (MISCELLANEOUS) ×2 IMPLANT
DRAPE HEAD BAR (DRAPES) ×2 IMPLANT
GAUZE SPONGE 4X4 12PLY STRL (GAUZE/BANDAGES/DRESSINGS) ×2 IMPLANT
GAUZE SPONGE NON-WVN 2X2 STRL (MISCELLANEOUS) ×16 IMPLANT
GLOVE SURG LX 7.0 MICRO (GLOVE) ×2
GLOVE SURG LX STRL 7.0 MICRO (GLOVE) ×2 IMPLANT
MARKER SKIN XFINE TIP W/RULER (MISCELLANEOUS) ×2 IMPLANT
NEEDLE FILTER BLUNT 18X 1/2SAF (NEEDLE) ×1
NEEDLE FILTER BLUNT 18X1 1/2 (NEEDLE) ×1 IMPLANT
NEEDLE HYPO 30X.5 LL (NEEDLE) ×4 IMPLANT
PACK DRAPE NASAL/ENT (PACKS) ×2 IMPLANT
SOL PREP PVP 2OZ (MISCELLANEOUS) ×2
SOLUTION PREP PVP 2OZ (MISCELLANEOUS) ×1 IMPLANT
SPONGE VERSALON 2X2 STRL (MISCELLANEOUS) ×16
SUT CHROMIC 4-0 (SUTURE)
SUT CHROMIC 4-0 M2 12X2 ARM (SUTURE)
SUT CHROMIC 5 0 P 3 (SUTURE) ×4 IMPLANT
SUT ETHILON 4 0 CL P 3 (SUTURE) IMPLANT
SUT MERSILENE 4-0 S-2 (SUTURE) IMPLANT
SUT PDS AB 4-0 P3 18 (SUTURE) IMPLANT
SUT PLAIN GUT (SUTURE) ×2 IMPLANT
SUT PROLENE 5 0 P 3 (SUTURE) ×4 IMPLANT
SUT PROLENE 6 0 P 1 18 (SUTURE) ×4 IMPLANT
SUT SILK 4 0 G 3 (SUTURE) IMPLANT
SUT VIC AB 5-0 P-3 18X BRD (SUTURE) IMPLANT
SUT VIC AB 5-0 P3 18 (SUTURE)
SUT VICRYL 7 0 TG140 8 (SUTURE) IMPLANT
SUTURE CHRMC 4-0 M2 12X2 ARM (SUTURE) IMPLANT
SYR 10ML LL (SYRINGE) ×2 IMPLANT
SYR 3ML LL SCALE MARK (SYRINGE) ×2 IMPLANT
WATER STERILE IRR 250ML POUR (IV SOLUTION) ×2 IMPLANT

## 2018-06-01 NOTE — H&P (Signed)
See the history and physical completed at [facility] on 05/18/18 and scanned into the chart.

## 2018-06-01 NOTE — Anesthesia Procedure Notes (Signed)
Procedure Name: MAC Date/Time: 06/01/2018 9:04 AM Performed by: Lind Guest, CRNA Pre-anesthesia Checklist: Patient identified, Emergency Drugs available, Suction available, Patient being monitored and Timeout performed Patient Re-evaluated:Patient Re-evaluated prior to induction Oxygen Delivery Method: Nasal cannula

## 2018-06-01 NOTE — Transfer of Care (Signed)
Immediate Anesthesia Transfer of Care Note  Patient: Grant Moreno.  Procedure(s) Performed: BLEPHAROPTOSIS REPAIR RESECT (Bilateral Eye) BROW PTOSIS (Bilateral Eye)  Patient Location: PACU  Anesthesia Type: MAC  Level of Consciousness: awake, alert  and patient cooperative  Airway and Oxygen Therapy: Patient Spontanous Breathing and Patient connected to supplemental oxygen  Post-op Assessment: Post-op Vital signs reviewed, Patient's Cardiovascular Status Stable, Respiratory Function Stable, Patent Airway and No signs of Nausea or vomiting  Post-op Vital Signs: Reviewed and stable  Complications: No apparent anesthesia complications

## 2018-06-01 NOTE — Op Note (Signed)
Preoperative Diagnosis:   1.  Visually significant bilateral brow ptosis.  2.  Visually significant blepharoptosis bilateral upper Eyelid(s)  Postoperative Diagnosis: Same.   Procedure(s) Performed:   1.  Bilateral direct brow lift to improve vision.  2.  Blepharoptosis repair with levator aponeurosis advancement lateral upper Eyelid(s)  Teaching Surgeon: Philis Pique. Vickki Muff, M.D.   Assistants: None   Anesthesia: MAC  Specimens: None.  Estimated Blood Loss: Minimal.  Complications: None.  Operative Findings: None Dictated  PROCEDURE:   Allergies were reviewed and the patient has No Known Allergies..   After the risks, benefits, complications and alternatives were discussed with the patient, appropriate informed consent was obtained. While seated in an upright position and looking in primary gaze, the amount of supra-brow skin to be removed was measured and marked in an elliptical pattern. The patient was then brought to the operating suite and reclined supine.  Timeout was conducted and the patient was sedated. Local anesthetic consisting of a 50-50 mixture of 2% lidocaine with epinephrine and 0.75% bupivacaine with added Hylenex was injected subcutaneously to the bilateral brow region(s) and down to the periosteum and  subcutaneously to both upper eyelid(s). After adequate local was instilled, the patient was prepped and draped in the usual sterile fashion for eyelid surgery.   Attention was turned to the right brow region. A #15 blade was used to create a bevelled incision along the premarked incision line. A skin and subcutaneous tissue flap was then excised and hemostasis was obtained with bipolar cautery. The deep tissues were reapproximated with interrupted vertical 5-0 chromic sutures. The skin margin was reapproximated with a running locking 5-0 Prolene suture. Attention was then turned to the opposite brow region where the same procedure was performed in the same manner.     Attention was then turned to the upper eyelids. A 39m upper eyelid crease incision line was marked with calipers on both upper eyelid(s).  A pinch test was used to estimate the amount of excess skin to remove and this was marked in standard blepharoplasty style fashion. Attention was turned to the right upper eyelid. A #15 blade was used to open the premarked incision line. A skin and partial muscle flap was excised and hemostasis was obtained with bipolar cautery.   Westcott scissors were then used to transect through orbicularis for the length of the incision down to the tarsal plate. Epitarsus was dissected to create a smooth surface to suture to. Dissection was then carried superiorly in the plane between orbicularis and orbital septum. Once the preaponeurotic fat pocket was identified, the orbital septum was opened. This revealed the levator and its aponeurosis.    Attention was then turned to the opposite eyelid where the same procedure was performed in the same manner.   3 interrupted 6-0 Prolene sutures were then passed partial thickness through the tarsal plates of both upper eyelid(s). These sutures were placed in line with the mid pupillary, medial limbal, and lateral limbal lines. The sutures were fixed to the levator aponeurosis and adjusted until a nice lid height and contour were achieved. Once nice symmetry was achieved, the skin incisions were closed with a combination of interrupted and  running 6-0 fast absorbing plain gut suture.   The patient tolerated the procedure well.  Erythromycin ophthalmic ointment was applied to the incision site(s) followed by ice packs.The patient was taken to the recovery area where he recovered without difficulty.  Post-Op Plan/Instructions:  The patient was instructed to use ice packs frequently  for the next 48 hours.  .he was instructed to use erythromycin ophthalmic ointment on his incisions 4 times a day for the next 12 to 14 days. he was given a  prescription for tramadol for pain control should Tylenol not be effective. he was asked to to follow up in 10 days time for suture removal or sooner as needed for problems.  Teaching Surgeon Attestation: None  Tedra Coppernoll M. Vickki Muff, M.D. Attending,Ophthalmology

## 2018-06-01 NOTE — Interval H&P Note (Signed)
History and Physical Interval Note:  06/01/2018 8:31 AM  Grant Moreno.  has presented today for surgery, with the diagnosis of H02.403 PTOSIS OF EYELID UNSPECIFIED L57.4 CUTIS LAXA SENILLIS  The various methods of treatment have been discussed with the patient and family. After consideration of risks, benefits and other options for treatment, the patient has consented to  Procedure(s): BLEPHAROPTOSIS REPAIR RESECT (Bilateral) BROW PTOSIS (Bilateral) as a surgical intervention .  The patient's history has been reviewed, patient examined, no change in status, stable for surgery.  I have reviewed the patient's chart and labs.  Questions were answered to the patient's satisfaction.     Vickki Muff, Claudie Brickhouse M

## 2018-06-01 NOTE — Anesthesia Postprocedure Evaluation (Signed)
Anesthesia Post Note  Patient: Grant Moreno.  Procedure(s) Performed: BLEPHAROPTOSIS REPAIR RESECT (Bilateral Eye) BROW PTOSIS (Bilateral Eye)  Patient location during evaluation: PACU Anesthesia Type: MAC Level of consciousness: awake and alert Pain management: pain level controlled Vital Signs Assessment: post-procedure vital signs reviewed and stable Respiratory status: spontaneous breathing, nonlabored ventilation, respiratory function stable and patient connected to nasal cannula oxygen Cardiovascular status: stable and blood pressure returned to baseline Postop Assessment: no apparent nausea or vomiting Anesthetic complications: no    Veda Canning

## 2018-06-01 NOTE — Anesthesia Preprocedure Evaluation (Signed)
Anesthesia Evaluation  Patient identified by MRN, date of birth, ID band Patient awake    Reviewed: Allergy & Precautions, NPO status , Patient's Chart, lab work & pertinent test results  Airway Mallampati: II  TM Distance: >3 FB    Comment: [employer]   Pulmonary neg recent URI, former smoker,    breath sounds clear to auscultation       Cardiovascular (-) angina+ dysrhythmias (atrial fibrillation - in sinus on tikosyn) + pacemaker  Rhythm:Regular Rate:Normal     Neuro/Psych    GI/Hepatic negative GI ROS, neg GERD  ,  Endo/Other  negative endocrine ROS  Renal/GU      Musculoskeletal   Abdominal   Peds  Hematology   Anesthesia Other Findings   Reproductive/Obstetrics                             Anesthesia Physical Anesthesia Plan  ASA: III  Anesthesia Plan: MAC   Post-op Pain Management:    Induction: Intravenous  PONV Risk Score and Plan:   Airway Management Planned: Nasal Cannula  Additional Equipment:   Intra-op Plan:   Post-operative Plan:   Informed Consent: I have reviewed the patients History and Physical, chart, labs and discussed the procedure including the risks, benefits and alternatives for the proposed anesthesia with the patient or authorized representative who has indicated his/her understanding and acceptance.     Plan Discussed with: CRNA  Anesthesia Plan Comments:         Anesthesia Quick Evaluation

## 2018-06-02 ENCOUNTER — Encounter: Payer: Self-pay | Admitting: Ophthalmology

## 2018-07-05 ENCOUNTER — Telehealth: Payer: Self-pay

## 2018-07-05 NOTE — Telephone Encounter (Signed)
   Primary Cardiologist:Steven Caryl Comes, MD  Chart reviewed as part of pre-operative protocol coverage. Because of Grant Cutting Jr.'s past medical history and time since last visit, he/she will require a follow-up visit in order to better assess preoperative cardiovascular risk.  Last seen 01/2018, was recommended to f/u in 6 months. Has appointment in October 2019 with Dr. Caryl Comes.  Pre-op covering staff: - Please contact requesting surgeon's office via preferred method (i.e, phone, fax) to inform them of need for appointment prior to surgery. - Add pre-op clearance modifier to appointment info so Dr. Caryl Comes is aware to address and route in his note  Charlie Pitter, PA-C  07/05/2018, 3:45 PM

## 2018-07-05 NOTE — Telephone Encounter (Signed)
Faxed over clearance form for [facility] to [phone].

## 2018-07-05 NOTE — Telephone Encounter (Signed)
   [facility] Pre-operative Risk Assessment    Request for surgical clearance:  1. What type of surgery is being performed? colonoscopy   2. When is this surgery scheduled? 10/18/2018   3. What type of clearance is required (medical clearance vs. Pharmacy clearance to hold med vs. Both)? pharmacy  4. Are there any medications that need to be held prior to surgery and how long?aspirin/Eliquis 3 days   5. Practice name and name of physician performing surgery? [facility] Gastroenterology Dept   6. What is your office phone [phone]    7.   What is your office fax [phone]  8.   Anesthesia type (None, local, MAC, general) ? Gwyneth Sprout  Rosalita Carey 07/05/2018, 2:55 PM  _________________________________________________________________   (provider comments below)

## 2018-08-16 ENCOUNTER — Ambulatory Visit (INDEPENDENT_AMBULATORY_CARE_PROVIDER_SITE_OTHER): Payer: Medicare Other | Admitting: *Deleted

## 2018-08-16 DIAGNOSIS — I428 Other cardiomyopathies: Secondary | ICD-10-CM

## 2018-08-16 DIAGNOSIS — I472 Ventricular tachycardia, unspecified: Secondary | ICD-10-CM

## 2018-08-16 NOTE — Progress Notes (Signed)
Remote pacemaker transmission.   

## 2018-08-18 LAB — CUP PACEART REMOTE DEVICE CHECK
Battery Voltage: 3.01 V
Brady Statistic AP VS Percent: 58.76 %
Brady Statistic AS VP Percent: 1.28 %
Implantable Lead Implant Date: 20160921
Implantable Lead Location: 753860
Implantable Lead Model: 5076
Implantable Lead Model: 5076
Implantable Pulse Generator Implant Date: 20160921
Lead Channel Impedance Value: 361 Ohm
Lead Channel Impedance Value: 437 Ohm
Lead Channel Pacing Threshold Amplitude: 1.125 V
Lead Channel Pacing Threshold Pulse Width: 0.4 ms
Lead Channel Sensing Intrinsic Amplitude: 2.25 mV
Lead Channel Sensing Intrinsic Amplitude: 8.125 mV
Lead Channel Sensing Intrinsic Amplitude: 8.125 mV
Lead Channel Setting Pacing Amplitude: 2 V
Lead Channel Setting Pacing Pulse Width: 0.4 ms
MDC IDC LEAD IMPLANT DT: 20160921
MDC IDC LEAD LOCATION: 753859
MDC IDC MSMT BATTERY REMAINING LONGEVITY: 88 mo
MDC IDC MSMT LEADCHNL RA PACING THRESHOLD AMPLITUDE: 0.375 V
MDC IDC MSMT LEADCHNL RA PACING THRESHOLD PULSEWIDTH: 0.4 ms
MDC IDC MSMT LEADCHNL RA SENSING INTR AMPL: 2.25 mV
MDC IDC MSMT LEADCHNL RV IMPEDANCE VALUE: 323 Ohm
MDC IDC MSMT LEADCHNL RV IMPEDANCE VALUE: 399 Ohm
MDC IDC SESS DTM: 20190930120946
MDC IDC SET LEADCHNL RV PACING AMPLITUDE: 2.5 V
MDC IDC SET LEADCHNL RV SENSING SENSITIVITY: 2.8 mV
MDC IDC STAT BRADY AP VP PERCENT: 1.93 %
MDC IDC STAT BRADY AS VS PERCENT: 38.03 %
MDC IDC STAT BRADY RA PERCENT PACED: 59.24 %
MDC IDC STAT BRADY RV PERCENT PACED: 3.56 %

## 2018-09-13 ENCOUNTER — Encounter: Payer: Self-pay | Admitting: Internal Medicine

## 2018-09-13 ENCOUNTER — Ambulatory Visit (INDEPENDENT_AMBULATORY_CARE_PROVIDER_SITE_OTHER): Payer: Medicare Other | Admitting: Internal Medicine

## 2018-09-13 VITALS — BP 136/88 | HR 74 | Ht 75.0 in | Wt 214.6 lb

## 2018-09-13 DIAGNOSIS — R001 Bradycardia, unspecified: Secondary | ICD-10-CM | POA: Diagnosis not present

## 2018-09-13 DIAGNOSIS — Z959 Presence of cardiac and vascular implant and graft, unspecified: Secondary | ICD-10-CM

## 2018-09-13 DIAGNOSIS — I480 Paroxysmal atrial fibrillation: Secondary | ICD-10-CM

## 2018-09-13 DIAGNOSIS — I428 Other cardiomyopathies: Secondary | ICD-10-CM | POA: Diagnosis not present

## 2018-09-13 DIAGNOSIS — I472 Ventricular tachycardia, unspecified: Secondary | ICD-10-CM

## 2018-09-13 MED ORDER — APIXABAN 5 MG PO TABS: 5.0000 mg | ORAL_TABLET | Freq: Two times a day (BID) | ORAL | 3 refills | Status: DC

## 2018-09-13 NOTE — Progress Notes (Signed)
Patient Care Team: [name], MD as PCP - General (Internal Medicine) Deboraha Sprang, MD as PCP - Cardiology (Cardiology)   HPI  Grant Moreno. is a 77 y.o. male Seen in followup for syncope with Wide QRS Tachycardia felt to be VT , noninducibility at EPS with LV dysfunction; cath normal CA 2/15    He has had problems with Afib and RVR as well as sinus bradycardia.   Syncope was associated with profound bradycardia; monitoring demonstrated subsequent control of atrial fibrillation rates  9/16 he was admitted to hospital with implantation of pacemaker and initiation of dofetilide.    Date Cr K Mg Hgb  5/17  0.8 4.3     4/18  0.94 4.4 2.1 13.7  9/18 0.84 4.5 2.3   3/19 0.85 4.5 2.3 14.4  5/19 0.8       DATE TEST EF   2/15 Echo   35-40 %   5/16 Echo   55-60 %          The patient denies chest pain, shortness of breath, nocturnal dyspnea, orthopnea; some peripheral edema.  There have been no palpitations, lightheadedness or syncope.    Past Medical History:  Diagnosis Date  . Dysrhythmia    PAF, VT  . Persistent atrial fibrillation    a. on tikosyn   . 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 Sinus bradycardia    a. s/p MDT PPM  . Syncope   . Ventricular tachycardia (paroxysmal)     Past Surgical History:  Procedure Laterality Date  . BROW PTOSIS Bilateral 06/01/2018   Procedure: BROW PTOSIS;  Surgeon: Karle Starch, MD;  Location: [place];  Service: Ophthalmology;  Laterality: Bilateral;  . ELECTROPHYSIOLOGY STUDY  01-08-14   negative EPS by Dr Lovena Le following syncopal episode and documented WCT  . ELECTROPHYSIOLOGY STUDY N/A 01/09/2014   Procedure: ELECTROPHYSIOLOGY STUDY +/- ICD, +/- ILR;  Surgeon: Evans Lance, MD;  Location: [facility] CATH LAB;  Service: Cardiovascular;  Laterality: N/A;  . EP IMPLANTABLE DEVICE N/A 08/08/2015   Procedure: Pacemaker Implant;  Surgeon: Deboraha Sprang, MD;  Location: [place] CV LAB;  Service:  Cardiovascular;  Laterality: N/A;  . EP IMPLANTABLE DEVICE N/A 08/08/2015   Procedure: Loop Recorder Removal;  Surgeon: Deboraha Sprang, MD;  Location: [place] CV LAB;  Service: Cardiovascular;  Laterality: N/A;  . FINGER SURGERY     Noted the only time patient has been admitted overnight.    Marland Kitchen LEFT HEART CATHETERIZATION WITH CORONARY ANGIOGRAM N/A 01/06/2014   Procedure: LEFT HEART CATHETERIZATION WITH CORONARY ANGIOGRAM;  Surgeon: Leonie Man, MD;  Location: [facility] [address] CATH LAB;  Service: Cardiovascular;  Laterality: N/A;  . LOOP RECORDER IMPLANT  01-08-14   MDT LinQ implanted by Dr Lovena Le for syncope and WCT  . PTOSIS REPAIR Bilateral 06/01/2018   Procedure: BLEPHAROPTOSIS REPAIR RESECT;  Surgeon: Karle Starch, MD;  Location: [place];  Service: Ophthalmology;  Laterality: Bilateral;    Current Outpatient Medications  Medication Sig Dispense Refill  . apixaban (ELIQUIS) 5 MG TABS tablet Take 1 tablet (5 mg total) by mouth 2 (two) times daily. 180 tablet 3  . Cholecalciferol (VITAMIN D3) 1000 units CAPS Take 1,000 Units by mouth daily.     Marland Kitchen dofetilide (TIKOSYN) 500 MCG capsule Take 1 capsule (500 mcg total) by mouth 2 (two) times daily. 180 capsule 3  . magnesium oxide (MAG-OX) 400 MG tablet Take 1 tablet (  400 mg total) by mouth daily. 30 tablet 6  . timolol (TIMOPTIC) 0.5 % ophthalmic solution Place 1 drop into the right eye at bedtime.    . vitamin B-12 (CYANOCOBALAMIN) 1000 MCG tablet Take 1,000 mcg by mouth daily.     No current facility-administered medications for this visit.     No Known Allergies  Review of Systems negative except from HPI and PMH  Physical Exam BP 136/88   Pulse 74   Ht 6\' 3"  (1.905 m)   Wt 214 lb 9.6 oz (97.3 kg)   SpO2 97%   BMI 26.82 kg/m   Well developed and nourished in no acute distress HENT normal Neck supple with JVP-flat Clear Device pocket well healed; without hematoma or erythema.  There is no tethering  Regular rate and rhythm,  no murmurs or gallops Abd-soft with active BS No Clubbing cyanosis edema Skin-warm and dry A & Oriented  Grossly normal sensory and motor function   ECG A pacing @ 74 20/14/40 PVCs freq     Assessment and  Plan  Syncope.   Atrial fibrillation persistent  Sinus bradycardia/Chronotropic incompetence  Wide QRS tachycardia  PVCs  LBBB infer axis, late intrinsicoid deflection   Congestive heart failure-chronic diastolic  Nonischemic cardiomyopathy resolved   Pacemaker-Medtronic  The patient's device was interrogated.  The information was reviewed. No changes were made in the programming.     High risk medication-dofetilide    no syncope  A little edema which may be related to travels.  LV function has been normal.  Last assessed 2016.  It does not resolve with repeat the echo.  On Anticoagulation;  No bleeding issues  Labs are not available.  They are to be drawn next month by his PCP.  He asked about colonoscopy versus Cologuard.  This in the context of holding his apixaban.  Said that my mind relates to pretest probability; with a negative history of GI issues, suspect pretest probability is low and Cologuard would be a very reasonable alternative.  Need to check dofetilide surveillance labs, PCP to be drawn blood next month  We spent more than 50% of our >25 min visit in face to face counseling regarding the above

## 2018-09-13 NOTE — Patient Instructions (Signed)
Medication Instructions:  Your physician recommends that you continue on your current medications as directed. Please refer to the Current Medication list given to you today.  Labwork: Your physician recommends that you return for lab work in:   CBC, BMP, Mg  Testing/Procedures: None ordered.  Follow-Up: Your physician recommends that you schedule a follow-up appointment in:   6 months with Dr Caryl Comes  Remote monitoring is used to monitor your Pacemaker of ICD from home. This monitoring reduces the number of office visits required to check your device to one time per year. It allows [place] to keep an eye on the functioning of your device to ensure it is working properly. You are scheduled for a device check from home on 12/30. You may send your transmission at any time that day. If you have a wireless device, the transmission will be sent automatically. After your physician reviews your transmission, you will receive a postcard with your next transmission date.    Any Other Special Instructions Will Be Listed Below (If Applicable).     If you need a refill on your cardiac medications before your next appointment, please call your pharmacy.

## 2018-09-22 LAB — CUP PACEART INCLINIC DEVICE CHECK
Battery Remaining Longevity: 84 mo
Battery Voltage: 3.01 V
Brady Statistic AS VP Percent: 1.9 %
Brady Statistic AS VS Percent: 42.6 %
Implantable Lead Implant Date: 20160921
Implantable Lead Implant Date: 20160921
Implantable Lead Location: 753859
Implantable Lead Location: 753860
Implantable Lead Model: 5076
Implantable Pulse Generator Implant Date: 20160921
Lead Channel Impedance Value: 380 Ohm
Lead Channel Impedance Value: 418 Ohm
Lead Channel Pacing Threshold Amplitude: 1.25 V
Lead Channel Pacing Threshold Pulse Width: 0.4 ms
Lead Channel Pacing Threshold Pulse Width: 0.4 ms
Lead Channel Sensing Intrinsic Amplitude: 10.6 mV
Lead Channel Sensing Intrinsic Amplitude: 2.9 mV
Lead Channel Setting Pacing Amplitude: 2 V
Lead Channel Setting Pacing Amplitude: 2.5 V
Lead Channel Setting Sensing Sensitivity: 2.8 mV
MDC IDC MSMT LEADCHNL RA PACING THRESHOLD AMPLITUDE: 0.5 V
MDC IDC SESS DTM: 20191106165059
MDC IDC SET LEADCHNL RV PACING PULSEWIDTH: 0.4 ms
MDC IDC STAT BRADY AP VP PERCENT: 2.4 %
MDC IDC STAT BRADY AP VS PERCENT: 53.1 %

## 2018-10-18 ENCOUNTER — Ambulatory Visit: Admit: 2018-10-18 | Payer: Medicare Other | Admitting: Unknown Physician Specialty

## 2018-10-18 SURGERY — COLONOSCOPY WITH PROPOFOL
Anesthesia: General

## 2018-11-15 ENCOUNTER — Ambulatory Visit (INDEPENDENT_AMBULATORY_CARE_PROVIDER_SITE_OTHER): Payer: Medicare Other

## 2018-11-15 DIAGNOSIS — I428 Other cardiomyopathies: Secondary | ICD-10-CM | POA: Diagnosis not present

## 2018-11-15 DIAGNOSIS — I495 Sick sinus syndrome: Secondary | ICD-10-CM

## 2018-11-15 NOTE — Progress Notes (Signed)
Remote pacemaker transmission.   

## 2018-11-16 LAB — CUP PACEART REMOTE DEVICE CHECK
Battery Remaining Longevity: 87 mo
Battery Voltage: 3.01 V
Brady Statistic AP VP Percent: 1.61 %
Brady Statistic RA Percent Paced: 48.03 %
Brady Statistic RV Percent Paced: 2.82 %
Implantable Lead Implant Date: 20160921
Implantable Lead Location: 753859
Implantable Lead Model: 5076
Implantable Pulse Generator Implant Date: 20160921
Lead Channel Impedance Value: 323 Ohm
Lead Channel Impedance Value: 361 Ohm
Lead Channel Sensing Intrinsic Amplitude: 2.625 mV
Lead Channel Sensing Intrinsic Amplitude: 2.625 mV
Lead Channel Setting Pacing Amplitude: 2.5 V
Lead Channel Setting Pacing Pulse Width: 0.4 ms
MDC IDC LEAD IMPLANT DT: 20160921
MDC IDC LEAD LOCATION: 753860
MDC IDC MSMT LEADCHNL RA IMPEDANCE VALUE: 456 Ohm
MDC IDC MSMT LEADCHNL RA PACING THRESHOLD AMPLITUDE: 0.5 V
MDC IDC MSMT LEADCHNL RA PACING THRESHOLD PULSEWIDTH: 0.4 ms
MDC IDC MSMT LEADCHNL RV IMPEDANCE VALUE: 380 Ohm
MDC IDC MSMT LEADCHNL RV PACING THRESHOLD AMPLITUDE: 0.75 V
MDC IDC MSMT LEADCHNL RV PACING THRESHOLD PULSEWIDTH: 0.4 ms
MDC IDC MSMT LEADCHNL RV SENSING INTR AMPL: 7.625 mV
MDC IDC MSMT LEADCHNL RV SENSING INTR AMPL: 7.625 mV
MDC IDC SESS DTM: 20191230141544
MDC IDC SET LEADCHNL RA PACING AMPLITUDE: 2 V
MDC IDC SET LEADCHNL RV SENSING SENSITIVITY: 2.8 mV
MDC IDC STAT BRADY AP VS PERCENT: 48.11 %
MDC IDC STAT BRADY AS VP PERCENT: 0.93 %
MDC IDC STAT BRADY AS VS PERCENT: 49.36 %

## 2019-01-04 ENCOUNTER — Other Ambulatory Visit: Payer: Self-pay

## 2019-01-04 MED ORDER — APIXABAN 5 MG PO TABS: 5.0000 mg | ORAL_TABLET | Freq: Two times a day (BID) | ORAL | 1 refills | Status: DC

## 2019-02-01 ENCOUNTER — Telehealth: Payer: Self-pay | Admitting: Cardiology

## 2019-02-01 NOTE — Telephone Encounter (Signed)
Patient called and stated that he sent a manual transmission w/ his home monitor b/c he has been dizzy, and recently diagnosed w/ bronchitis and he was on prednisone and doxycycline. He finished the prednisone 5 days ago and he finished the doxycycline yesterday. He sent a remote transmission to see if there is anything related to the dizziness on his device or if it was the medicine causing the dizziness. Informed pt that I would send a message to the RN and someone will call him back the results of the transmission.

## 2019-02-01 NOTE — Telephone Encounter (Signed)
Spoke with patient. Dizziness correlates with increased AF burden (likely 2/2 bronchitis treatment with Prednisone). Pt in SR today and feels better. I have advised to let us know if symptoms return - we could consider adding a BB or CCB for the short term to help maintain SR. He has scheduled remote 3/30, will re-evaluate then.    Chanetta Marshall, NP 02/01/2019 2:22 PM

## 2019-02-02 NOTE — Telephone Encounter (Signed)
THNKS

## 2019-02-14 ENCOUNTER — Ambulatory Visit (INDEPENDENT_AMBULATORY_CARE_PROVIDER_SITE_OTHER): Payer: Medicare Other | Admitting: *Deleted

## 2019-02-14 ENCOUNTER — Other Ambulatory Visit: Payer: Self-pay

## 2019-02-14 DIAGNOSIS — I428 Other cardiomyopathies: Secondary | ICD-10-CM

## 2019-02-14 DIAGNOSIS — I495 Sick sinus syndrome: Secondary | ICD-10-CM

## 2019-02-14 LAB — CUP PACEART REMOTE DEVICE CHECK
Battery Remaining Longevity: 80 mo
Battery Voltage: 3.01 V
Brady Statistic AP VS Percent: 52.05 %
Brady Statistic AS VP Percent: 0.64 %
Brady Statistic AS VS Percent: 45.43 %
Brady Statistic RA Percent Paced: 53 %
Brady Statistic RV Percent Paced: 2.85 %
Date Time Interrogation Session: 20200330115033
Implantable Lead Implant Date: 20160921
Implantable Lead Implant Date: 20160921
Implantable Lead Location: 753860
Implantable Lead Model: 5076
Implantable Lead Model: 5076
Implantable Pulse Generator Implant Date: 20160921
Lead Channel Impedance Value: 361 Ohm
Lead Channel Impedance Value: 361 Ohm
Lead Channel Impedance Value: 437 Ohm
Lead Channel Pacing Threshold Amplitude: 0.375 V
Lead Channel Pacing Threshold Amplitude: 0.75 V
Lead Channel Pacing Threshold Pulse Width: 0.4 ms
Lead Channel Pacing Threshold Pulse Width: 0.4 ms
Lead Channel Sensing Intrinsic Amplitude: 2.125 mV
Lead Channel Sensing Intrinsic Amplitude: 2.125 mV
Lead Channel Sensing Intrinsic Amplitude: 7.625 mV
Lead Channel Setting Pacing Amplitude: 2.5 V
Lead Channel Setting Pacing Pulse Width: 0.4 ms
Lead Channel Setting Sensing Sensitivity: 2.8 mV
MDC IDC LEAD LOCATION: 753859
MDC IDC MSMT LEADCHNL RV IMPEDANCE VALUE: 323 Ohm
MDC IDC MSMT LEADCHNL RV SENSING INTR AMPL: 7.625 mV
MDC IDC SET LEADCHNL RA PACING AMPLITUDE: 2 V
MDC IDC STAT BRADY AP VP PERCENT: 1.88 %

## 2019-02-21 NOTE — Progress Notes (Signed)
Remote pacemaker transmission.   

## 2019-03-14 ENCOUNTER — Telehealth: Payer: Self-pay | Admitting: Internal Medicine

## 2019-03-14 NOTE — Telephone Encounter (Signed)
I spoke to the patient who is concerned about his 5/5 appt with Dr Caryl Comes.  He was inquiring whether this will be an OV or changed.  Please call with advisement, thank you.

## 2019-03-14 NOTE — Telephone Encounter (Signed)
I look at the pt appointment and informed the pt that his appointment is Mar 22, 2019 at 1:45 pm with Dr. Caryl Comes. I told him it was indeed an in office visit and to keep that appointment. I told him if there was any changes that the scheduler would give him a call and let him know.

## 2019-03-14 NOTE — Telephone Encounter (Signed)
New Message:    Pt wants to cx his May 6th appt, with Dr Caryl Comes, until he is able to be seen in the office. Pt wants to know if he will still be able to get his medicine refilled.?

## 2019-03-16 ENCOUNTER — Telehealth: Payer: Self-pay

## 2019-03-16 NOTE — Telephone Encounter (Signed)
Pt understands his visit will be a virtual visit. We will be in contact with him regarding the time change, if necessary. Pt understands I will send him consent through mychart and I will call him the day of his appt to help him set it up.

## 2019-03-16 NOTE — Telephone Encounter (Signed)
Pt is calling back to make sure his appointment is indeed an office visit. I explained to him that the appointment is an office visit.  I told him if it changes the nurse will call him before the day of the appointment to let him know. The pt also have questions about a couple of medication refills.

## 2019-03-22 ENCOUNTER — Telehealth (INDEPENDENT_AMBULATORY_CARE_PROVIDER_SITE_OTHER): Payer: Medicare Other | Admitting: Internal Medicine

## 2019-03-22 ENCOUNTER — Encounter: Payer: Self-pay | Admitting: Internal Medicine

## 2019-03-22 ENCOUNTER — Other Ambulatory Visit: Payer: Self-pay

## 2019-03-22 VITALS — BP 139/81 | HR 65 | Ht 75.0 in | Wt 210.0 lb

## 2019-03-22 DIAGNOSIS — I495 Sick sinus syndrome: Secondary | ICD-10-CM

## 2019-03-22 DIAGNOSIS — I429 Cardiomyopathy, unspecified: Secondary | ICD-10-CM

## 2019-03-22 DIAGNOSIS — I480 Paroxysmal atrial fibrillation: Secondary | ICD-10-CM

## 2019-03-22 MED ORDER — DOFETILIDE 500 MCG PO CAPS: 500.0000 ug | ORAL_CAPSULE | Freq: Two times a day (BID) | ORAL | 3 refills | Status: DC

## 2019-03-22 MED ORDER — APIXABAN 5 MG PO TABS: 5.0000 mg | ORAL_TABLET | Freq: Two times a day (BID) | ORAL | 3 refills | Status: DC

## 2019-03-22 NOTE — Progress Notes (Signed)
Electrophysiology TeleHealth Note   Due to national recommendations of social distancing due to COVID 19, an audio/video telehealth visit is felt to be most appropriate for this patient at this time.  See MyChart message from today for the patient's consent to telehealth for [facility].   Date:  03/22/2019   ID:  Grant Moreno., DOB Apr 22, 1941, MRN [id number]  Location: patient's home  Provider location: [address], [place]  Evaluation Performed: Follow-up visit  PCP:  [name], MD  Cardiologist:   TG Electrophysiologist:  SK   Chief Complaint:  Atrial fibrillation  History of Present Illness:    Grant Moreno. is a 78 y.o. male who presents via audio/video conferencing for a telehealth visit today.  Since last being seen in our clinic for syncope, afib with tachybrady, s/p pacing and dofetilide, the patient reports doing well  The patient denies chest pain, shortness of breath, nocturnal dyspnea, orthopnea or peripheral edema.  There have been no palpitations, lightheadedness or syncope.    Date Cr K Mg Hgb  2/20  0.9 4.4 2.2(11/19) 15 (11/19)            The patient denies symptoms of fevers, chills, cough, or new SOB worrisome for COVID 19.    Past Medical History:  Diagnosis Date  . Dysrhythmia    PAF, VT  . Persistent atrial fibrillation    a. on tikosyn   . 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 Sinus bradycardia    a. s/p MDT PPM  . Syncope   . Ventricular tachycardia (paroxysmal)     Past Surgical History:  Procedure Laterality Date  . BROW PTOSIS Bilateral 06/01/2018   Procedure: BROW PTOSIS;  Surgeon: Karle Starch, MD;  Location: [place];  Service: Ophthalmology;  Laterality: Bilateral;  . ELECTROPHYSIOLOGY STUDY  01-08-14   negative EPS by Dr Lovena Le following syncopal episode and documented WCT  . ELECTROPHYSIOLOGY STUDY N/A 01/09/2014   Procedure: ELECTROPHYSIOLOGY STUDY +/- ICD, +/- ILR;   Surgeon: Evans Lance, MD;  Location: [facility] CATH LAB;  Service: Cardiovascular;  Laterality: N/A;  . EP IMPLANTABLE DEVICE N/A 08/08/2015   Procedure: Pacemaker Implant;  Surgeon: Deboraha Sprang, MD;  Location: [place] CV LAB;  Service: Cardiovascular;  Laterality: N/A;  . EP IMPLANTABLE DEVICE N/A 08/08/2015   Procedure: Loop Recorder Removal;  Surgeon: Deboraha Sprang, MD;  Location: [place] CV LAB;  Service: Cardiovascular;  Laterality: N/A;  . FINGER SURGERY     Noted the only time patient has been admitted overnight.    Marland Kitchen LEFT HEART CATHETERIZATION WITH CORONARY ANGIOGRAM N/A 01/06/2014   Procedure: LEFT HEART CATHETERIZATION WITH CORONARY ANGIOGRAM;  Surgeon: Leonie Man, MD;  Location: [facility] CATH LAB;  Service: Cardiovascular;  Laterality: N/A;  . LOOP RECORDER IMPLANT  01-08-14   MDT LinQ implanted by Dr Lovena Le for syncope and WCT  . PTOSIS REPAIR Bilateral 06/01/2018   Procedure: BLEPHAROPTOSIS REPAIR RESECT;  Surgeon: Karle Starch, MD;  Location: [place];  Service: Ophthalmology;  Laterality: Bilateral;    Current Outpatient Medications  Medication Sig Dispense Refill  . apixaban (ELIQUIS) 5 MG TABS tablet Take 1 tablet (5 mg total) by mouth 2 (two) times daily. 180 tablet 1  . Cholecalciferol (VITAMIN D3) 1000 units CAPS Take 1,000 Units by mouth daily.     Marland Kitchen dofetilide (TIKOSYN) 500 MCG capsule Take 1 capsule (500 mcg total) by mouth 2 (two)  times daily. 180 capsule 3  . magnesium oxide (MAG-OX) 400 MG tablet Take 1 tablet (400 mg total) by mouth daily. 30 tablet 6  . timolol (TIMOPTIC) 0.5 % ophthalmic solution Place 1 drop into the right eye at bedtime.    . vitamin B-12 (CYANOCOBALAMIN) 1000 MCG tablet Take 1,000 mcg by mouth daily.    . vitamin C (ASCORBIC ACID) 500 MG tablet Take 500 mg by mouth daily.     No current facility-administered medications for this visit.     Allergies:   Patient has no known allergies.   Social History:  The patient  reports  that he quit smoking about 30 years ago. His smoking use included cigarettes. He smoked 3.00 packs per day. He has quit using smokeless tobacco.  His smokeless tobacco use included chew. He reports that he does not drink alcohol or use drugs.   Family History:  The patient's   family history is not on file.   ROS:  Please see the history of present illness.   All other systems are personally reviewed and negative.    Exam:    Vital Signs:  BP 139/81   Pulse 65   Ht 6\' 3"  (1.905 m)   Wt 210 lb (95.3 kg)   BMI 26.25 kg/m     Well appearing, alert and conversant, regular work of breathing,  good skin color Eyes- anicteric, neuro- grossly intact, skin- no apparent rash or lesions or cyanosis, mouth- oral mucosa is pink   Labs/Other Tests and Data Reviewed:    Recent Labs: No results found for requested labs within last 8760 hours.   Wt Readings from Last 3 Encounters:  03/22/19 210 lb (95.3 kg)  09/13/18 214 lb 9.6 oz (97.3 kg)  06/01/18 204 lb (92.5 kg)     Other studies personally reviewed: Additional studies/ records that were reviewed today include:  Last device remote is reviewed from [facility] PDF dated  3/17/2 0 and 3/30/20which reveals normal device function,   arrhythmias - significant atrial fib burden ( w/o) atrial egrams--this occurred in the context of bronchitis which had required steroids, subsequent interrogation showed much less   ASSESSMENT & PLAN:    Syncope.   Atrial fibrillation persistent  Sinus bradycardia/Chronotropic incompetence  Wide QRS tachycardia  PVCs  LBBB infer axis, late intrinsicoid deflection   Congestive heart failure-chronic diastolic  Nonischemic cardiomyopathy resolved   Pacemaker-Medtronic      High risk medication-dofetilide   Infrequent intercurrent atrial fibrillation.  Tolerating dofetilide.  Labs were in range in February.  We will plan to recheck them in about 5 or 6 months.  Euvolemic continue current meds   On Anticoagulation;  No bleeding issues   There was a significant amount of high rate pacing during his atrial arrhythmia; we will have to look into this.  He may need a lower max track rate or mode switch rate.    COVID 19 screen The patient denies symptoms of COVID 19 at this time.  The importance of social distancing was discussed today.  Follow-up:  51m Next remote: As Scheduled   Current medicines are reviewed at length with the patient today.   The patient does not have concerns regarding his medicines.  The following changes were made today:  none  Labs/ tests ordered today include:   No orders of the defined types were placed in this encounter.   Future tests ( post COVID )    months  Patient Risk:  after full  review of this patients clinical status, I feel that they are at moderate risk at this time.  Today, I have spent 7 minutes with the patient with telehealth technology discussing the above.  Signed, Virl Axe, MD  03/22/2019 2:30 PM     [address] [place] [id number] [phone] (office) [phone] (fax)

## 2019-05-25 ENCOUNTER — Ambulatory Visit (INDEPENDENT_AMBULATORY_CARE_PROVIDER_SITE_OTHER): Payer: Medicare Other | Admitting: *Deleted

## 2019-05-25 DIAGNOSIS — I495 Sick sinus syndrome: Secondary | ICD-10-CM | POA: Diagnosis not present

## 2019-05-25 LAB — CUP PACEART REMOTE DEVICE CHECK
Battery Remaining Longevity: 76 mo
Battery Voltage: 3.01 V
Brady Statistic AP VP Percent: 3.24 %
Brady Statistic AP VS Percent: 47.73 %
Brady Statistic AS VP Percent: 1.48 %
Brady Statistic AS VS Percent: 47.54 %
Brady Statistic RA Percent Paced: 49.33 %
Brady Statistic RV Percent Paced: 4.99 %
Date Time Interrogation Session: 20200708105356
Implantable Lead Implant Date: 20160921
Implantable Lead Implant Date: 20160921
Implantable Lead Location: 753859
Implantable Lead Location: 753860
Implantable Lead Model: 5076
Implantable Lead Model: 5076
Implantable Pulse Generator Implant Date: 20160921
Lead Channel Impedance Value: 361 Ohm
Lead Channel Impedance Value: 399 Ohm
Lead Channel Impedance Value: 437 Ohm
Lead Channel Impedance Value: 475 Ohm
Lead Channel Pacing Threshold Amplitude: 0.5 V
Lead Channel Pacing Threshold Amplitude: 0.875 V
Lead Channel Pacing Threshold Pulse Width: 0.4 ms
Lead Channel Pacing Threshold Pulse Width: 0.4 ms
Lead Channel Sensing Intrinsic Amplitude: 2.5 mV
Lead Channel Sensing Intrinsic Amplitude: 2.5 mV
Lead Channel Sensing Intrinsic Amplitude: 8.25 mV
Lead Channel Sensing Intrinsic Amplitude: 8.25 mV
Lead Channel Setting Pacing Amplitude: 2 V
Lead Channel Setting Pacing Amplitude: 2.5 V
Lead Channel Setting Pacing Pulse Width: 0.4 ms
Lead Channel Setting Sensing Sensitivity: 2.8 mV

## 2019-06-06 ENCOUNTER — Encounter: Payer: Self-pay | Admitting: Cardiology

## 2019-06-06 NOTE — Progress Notes (Signed)
Remote pacemaker transmission.   

## 2019-08-01 ENCOUNTER — Telehealth: Payer: Self-pay | Admitting: Internal Medicine

## 2019-08-01 NOTE — Telephone Encounter (Signed)
New message:    Patient calling and cancel his home remote and would like to reschedule. Patient would like for some one to call back.

## 2019-08-01 NOTE — Telephone Encounter (Signed)
I spoke with pt and rescheduled his home remote appointment.

## 2019-08-31 ENCOUNTER — Ambulatory Visit (INDEPENDENT_AMBULATORY_CARE_PROVIDER_SITE_OTHER): Payer: Medicare Other | Admitting: *Deleted

## 2019-08-31 DIAGNOSIS — I472 Ventricular tachycardia: Secondary | ICD-10-CM

## 2019-08-31 DIAGNOSIS — I4729 Other ventricular tachycardia: Secondary | ICD-10-CM

## 2019-08-31 DIAGNOSIS — R001 Bradycardia, unspecified: Secondary | ICD-10-CM

## 2019-08-31 LAB — CUP PACEART REMOTE DEVICE CHECK
Battery Remaining Longevity: 75 mo
Battery Voltage: 3.01 V
Brady Statistic AP VP Percent: 5.49 %
Brady Statistic AP VS Percent: 47.88 %
Brady Statistic AS VP Percent: 2.07 %
Brady Statistic AS VS Percent: 44.56 %
Brady Statistic RA Percent Paced: 51.47 %
Brady Statistic RV Percent Paced: 7.98 %
Date Time Interrogation Session: 20201014121039
Implantable Lead Implant Date: 20160921
Implantable Lead Implant Date: 20160921
Implantable Lead Location: 753859
Implantable Lead Location: 753860
Implantable Lead Model: 5076
Implantable Lead Model: 5076
Implantable Pulse Generator Implant Date: 20160921
Lead Channel Impedance Value: 323 Ohm
Lead Channel Impedance Value: 361 Ohm
Lead Channel Impedance Value: 361 Ohm
Lead Channel Impedance Value: 456 Ohm
Lead Channel Pacing Threshold Amplitude: 0.5 V
Lead Channel Pacing Threshold Amplitude: 0.75 V
Lead Channel Pacing Threshold Pulse Width: 0.4 ms
Lead Channel Pacing Threshold Pulse Width: 0.4 ms
Lead Channel Sensing Intrinsic Amplitude: 3.25 mV
Lead Channel Sensing Intrinsic Amplitude: 3.25 mV
Lead Channel Sensing Intrinsic Amplitude: 9.625 mV
Lead Channel Sensing Intrinsic Amplitude: 9.625 mV
Lead Channel Setting Pacing Amplitude: 2 V
Lead Channel Setting Pacing Amplitude: 2.5 V
Lead Channel Setting Pacing Pulse Width: 0.4 ms
Lead Channel Setting Sensing Sensitivity: 2.8 mV

## 2019-09-12 NOTE — Progress Notes (Signed)
Remote pacemaker transmission.   

## 2019-10-18 ENCOUNTER — Telehealth: Payer: Self-pay

## 2019-10-18 NOTE — Telephone Encounter (Signed)
There are no issues with pravastatin and any of patient's other meds, Tikosyn included. He is safe to take pravastatin.

## 2019-10-18 NOTE — Telephone Encounter (Signed)
Patient was started on pravastatin by his PCP. Patient stated that PCP wanted him to double check with Dr. Caryl Comes to see if it's okay to take this medications with everything on his list. He is mainly concerned about dofetilide (tikosyn). Will forward to our Pharm D.   Per patient's lab work from Dr. Tonette Bihari office-  Total Cholesterol    170 HDL                       54.9 LDL                         104 Hgb A1c                   6.6

## 2019-10-18 NOTE — Telephone Encounter (Signed)
The pt called stating his pcp prescribed him pravastatin for his diabetes. The pcp told him to talk with Dr. Caryl Comes and his nurse before taking the medication. The pt would like for someone to call him today. The best number for him is [phone].

## 2019-10-18 NOTE — Telephone Encounter (Signed)
Called patient back with Pharm D recommendation. Patient verbalized understanding.

## 2019-11-17 ENCOUNTER — Encounter: Payer: Medicare Other | Admitting: Internal Medicine

## 2019-11-24 ENCOUNTER — Other Ambulatory Visit: Payer: Self-pay | Admitting: Internal Medicine

## 2019-11-24 NOTE — Telephone Encounter (Signed)
Eliquis 5mg  refill request received, pt is 79 yrs old, weight-97.3kg, Crea-0.80 on 10/11/2019, Diagnosis-Afib, and last seen by Dr. Caryl Comes on 03/22/2019. Dose is appropriate based on dosing criteria. Will send in refill to requested pharmacy.

## 2019-11-28 ENCOUNTER — Other Ambulatory Visit: Payer: Self-pay

## 2019-11-28 ENCOUNTER — Encounter: Payer: Self-pay | Admitting: Internal Medicine

## 2019-11-28 ENCOUNTER — Ambulatory Visit: Payer: Medicare Other | Admitting: Internal Medicine

## 2019-11-28 VITALS — BP 120/78 | HR 73 | Ht 75.0 in | Wt 212.0 lb

## 2019-11-28 DIAGNOSIS — I429 Cardiomyopathy, unspecified: Secondary | ICD-10-CM | POA: Diagnosis not present

## 2019-11-28 DIAGNOSIS — I495 Sick sinus syndrome: Secondary | ICD-10-CM

## 2019-11-28 DIAGNOSIS — Z959 Presence of cardiac and vascular implant and graft, unspecified: Secondary | ICD-10-CM

## 2019-11-28 DIAGNOSIS — I480 Paroxysmal atrial fibrillation: Secondary | ICD-10-CM

## 2019-11-28 DIAGNOSIS — I428 Other cardiomyopathies: Secondary | ICD-10-CM

## 2019-11-28 MED ORDER — DOFETILIDE 500 MCG PO CAPS: 500.0000 ug | ORAL_CAPSULE | Freq: Two times a day (BID) | ORAL | 3 refills | Status: DC

## 2019-11-28 MED ORDER — APIXABAN 5 MG PO TABS: 5.0000 mg | ORAL_TABLET | Freq: Two times a day (BID) | ORAL | 3 refills | Status: DC

## 2019-11-28 NOTE — Progress Notes (Signed)
Patient Care Team: [name], MD as PCP - General (Internal Medicine) Deboraha Sprang, MD as PCP - Cardiology (Cardiology)   HPI  Grant Moreno. is a 79 y.o. male Seen in followup for syncope with Wide QRS Tachycardia felt to be VT , noninducibility at EPS with LV dysfunction; cath normal CA 2/15    He has had problems with Afib and RVR as well as sinus bradycardia.   Syncope was associated with profound bradycardia; monitoring demonstrated subsequent control of atrial fibrillation rates  9/16 he was admitted to hospital with implantation of pacemaker and initiation of dofetilide.    No intercurrent atrial fibrillation or flutter   On Anticoagulation;  No bleeding issues    Date Cr K Mg Hgb  5/17  0.8 4.3     4/18  0.94 4.4 2.1 13.7  9/18 0.84 4.5 2.3   3/19 0.85 4.5 2.3 14.4  5/19 0.8     11/20 0.8 4.5 2.1     Hemoglobin 11/19 was stable at 15.1 however, MCV had gone 4/18   97.5--103.6  DATE TEST EF   2/15 Echo   35-40 %   5/16 Echo   55-60 %         The patient denies chest pain, shortness of breath, nocturnal dyspnea, orthopnea or peripheral edema.  There have been no  lightheadedness or syncope.    Past Medical History:  Diagnosis Date  . Dysrhythmia    PAF, VT  . Hx of dysplastic nevus 10/17/2008   Lower back  . Persistent atrial fibrillation ([place])    a. on tikosyn   . 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 Sinus bradycardia    a. s/p MDT PPM  . Syncope   . Ventricular tachycardia (paroxysmal)     Past Surgical History:  Procedure Laterality Date  . BROW PTOSIS Bilateral 06/01/2018   Procedure: BROW PTOSIS;  Surgeon: Karle Starch, MD;  Location: [place];  Service: Ophthalmology;  Laterality: Bilateral;  . ELECTROPHYSIOLOGY STUDY  01-08-14   negative EPS by Dr Lovena Le following syncopal episode and documented WCT  . ELECTROPHYSIOLOGY STUDY N/A 01/09/2014   Procedure: ELECTROPHYSIOLOGY STUDY +/- ICD, +/- ILR;   Surgeon: Evans Lance, MD;  Location[facility] CATH LAB;  Service: Cardiovascular;  Laterality: N/A;  . EP IMPLANTABLE DEVICE N/A 08/08/2015   Procedure: Pacemaker Implant;  Surgeon: Deboraha Sprang, MD;  Location: [place] CV LAB;  Service: Cardiovascular;  Laterality: N/A;  . EP IMPLANTABLE DEVICE N/A 08/08/2015   Procedure: Loop Recorder Removal;  Surgeon: Deboraha Sprang, MD;  Location: [place] CV LAB;  Service: Cardiovascular;  Laterality: N/A;  . FINGER SURGERY     Noted the only time patient has been admitted overnight.    Marland Kitchen LEFT HEART CATHETERIZATION WITH CORONARY ANGIOGRAM N/A 01/06/2014   Procedure: LEFT HEART CATHETERIZATION WITH CORONARY ANGIOGRAM;  Surgeon: Leonie Man, MD;  Location[facility] CATH LAB;  Service: Cardiovascular;  Laterality: N/A;  . LOOP RECORDER IMPLANT  01-08-14   MDT LinQ implanted by Dr Lovena Le for syncope and WCT  . PTOSIS REPAIR Bilateral 06/01/2018   Procedure: BLEPHAROPTOSIS REPAIR RESECT;  Surgeon: Karle Starch, MD;  Location: [place];  Service: Ophthalmology;  Laterality: Bilateral;    Current Outpatient Medications  Medication Sig Dispense Refill  . apixaban (ELIQUIS) 5 MG TABS tablet Take 1 tablet (5 mg total) by mouth 2 (two) times daily. 180 tablet 3  .  Cholecalciferol (VITAMIN D3) 1000 units CAPS Take 1,000 Units by mouth daily.     Marland Kitchen dofetilide (TIKOSYN) 500 MCG capsule Take 1 capsule (500 mcg total) by mouth 2 (two) times daily. 180 capsule 3  . ELDERBERRY PO Take 1 capsule by mouth daily.    . magnesium oxide (MAG-OX) 400 MG tablet Take 1 tablet (400 mg total) by mouth daily. 30 tablet 6  . Omega-3 Fatty Acids (FISH OIL) 1000 MG CAPS Take 1 capsule by mouth daily.    . pravastatin (PRAVACHOL) 20 MG tablet Take 1 tablet by mouth daily.    . timolol (TIMOPTIC) 0.5 % ophthalmic solution Place 1 drop into the right eye at bedtime.    . vitamin B-12 (CYANOCOBALAMIN) 1000 MCG tablet Take 1,000 mcg by mouth daily.    . vitamin C (ASCORBIC ACID)  500 MG tablet Take 500 mg by mouth daily.     No current facility-administered medications for this visit.    No Known Allergies  Review of Systems negative except from HPI and PMH  Physical Exam BP 120/78   Pulse 73   Ht 6\' 3"  (1.905 m)   Wt 212 lb (96.2 kg)   SpO2 98%   BMI 26.50 kg/m  Well developed and well nourished in no acute distress HENT normal Neck supple with JVP-flat Clear Device pocket well healed; without hematoma or erythema.  There is no tethering  Regular rate and rhythm, no  murmur Abd-soft with active BS No Clubbing cyanosis  edema Skin-warm and dry A & Oriented  Grossly normal sensory and motor function  ECG sinus at 73 Interval 17/13/42 Axis left -57 PVCs-frequent (2/12 left bundle inferior axis) Assessment and  Plan  Syncope.   Atrial fibrillation persistent  Sinus bradycardia/Chronotropic incompetence  Wide QRS tachycardia  PVCs  LBBB infer axis, late intrinsicoid deflection   Congestive heart failure-chronic diastolic  Nonischemic cardiomyopathy resolved   Pacemaker-Medtronic  The patient's device was interrogated.  The information was reviewed. No changes were made in the programming.     High risk medication-dofetilide  MCV elevated     PVC burden is not significantly elevated based on histograms. No significant atrial fibrillation in the last greater than 6 months.  Last hemoglobin demonstrated an elevated MCV; we will repeat. \ On Anticoagulation;  No bleeding issues   Euvolemic continue current meds

## 2019-11-28 NOTE — Patient Instructions (Addendum)
Medication Instructions:  Your physician recommends that you continue on your current medications as directed. Please refer to the Current Medication list given to you today.  Labwork: CBC today  Testing/Procedures: None ordered.  Follow-Up: Your physician wants you to follow-up in: 6 months with Dr Gari Crown will receive a reminder letter in the mail two months in advance. If you don't receive a letter, please call our office to schedule the follow-up appointment.  Remote monitoring is used to monitor your Pacemaker of ICD from home. This monitoring reduces the number of office visits required to check your device to one time per year. It allows [place] to keep an eye on the functioning of your device to ensure it is working properly. Any Other Special Instructions Will Be Listed Below (If Applicable).  If you need a refill on your cardiac medications before your next appointment, please call your pharmacy.

## 2019-11-29 LAB — CBC
Hematocrit: 43.1 % (ref 37.5–51.0)
Hemoglobin: 15 g/dL (ref 13.0–17.7)
MCH: 34.6 pg — ABNORMAL HIGH (ref 26.6–33.0)
MCHC: 34.8 g/dL (ref 31.5–35.7)
MCV: 99 fL — ABNORMAL HIGH (ref 79–97)
Platelets: 253 10*3/uL (ref 150–450)
RBC: 4.34 x10E6/uL (ref 4.14–5.80)
RDW: 11.5 % — ABNORMAL LOW (ref 11.6–15.4)
WBC: 12.1 10*3/uL — ABNORMAL HIGH (ref 3.4–10.8)

## 2019-11-30 ENCOUNTER — Ambulatory Visit (INDEPENDENT_AMBULATORY_CARE_PROVIDER_SITE_OTHER): Payer: Medicare Other | Admitting: *Deleted

## 2019-11-30 DIAGNOSIS — R001 Bradycardia, unspecified: Secondary | ICD-10-CM | POA: Diagnosis not present

## 2019-12-01 LAB — CUP PACEART REMOTE DEVICE CHECK
Battery Remaining Longevity: 76 mo
Battery Voltage: 3 V
Brady Statistic AP VP Percent: 1.69 %
Brady Statistic AP VS Percent: 39.31 %
Brady Statistic AS VP Percent: 2.96 %
Brady Statistic AS VS Percent: 56.04 %
Brady Statistic RA Percent Paced: 36.7 %
Brady Statistic RV Percent Paced: 4.67 %
Date Time Interrogation Session: 20210114101156
Implantable Lead Implant Date: 20160921
Implantable Lead Implant Date: 20160921
Implantable Lead Location: 753859
Implantable Lead Location: 753860
Implantable Lead Model: 5076
Implantable Lead Model: 5076
Implantable Pulse Generator Implant Date: 20160921
Lead Channel Impedance Value: 323 Ohm
Lead Channel Impedance Value: 361 Ohm
Lead Channel Impedance Value: 361 Ohm
Lead Channel Impedance Value: 456 Ohm
Lead Channel Pacing Threshold Amplitude: 0.375 V
Lead Channel Pacing Threshold Amplitude: 0.75 V
Lead Channel Pacing Threshold Pulse Width: 0.4 ms
Lead Channel Pacing Threshold Pulse Width: 0.4 ms
Lead Channel Sensing Intrinsic Amplitude: 10 mV
Lead Channel Sensing Intrinsic Amplitude: 10 mV
Lead Channel Sensing Intrinsic Amplitude: 3.5 mV
Lead Channel Sensing Intrinsic Amplitude: 3.5 mV
Lead Channel Setting Pacing Amplitude: 2 V
Lead Channel Setting Pacing Amplitude: 2.5 V
Lead Channel Setting Pacing Pulse Width: 0.4 ms
Lead Channel Setting Sensing Sensitivity: 2.8 mV

## 2019-12-05 ENCOUNTER — Telehealth: Payer: Self-pay

## 2019-12-05 NOTE — Telephone Encounter (Signed)
-----   Message from Grant Sprang, MD sent at 12/03/2019  9:00 PM EST ----- Please Inform Patient that mild abnormality in red blood cell size is better Thanks

## 2019-12-05 NOTE — Telephone Encounter (Signed)
Call to patient to make him aware of result and note from Dr. Caryl Comes.   No further orders at this time.   Advised pt to call for any further questions or concerns.

## 2019-12-15 ENCOUNTER — Ambulatory Visit: Payer: Medicare Other

## 2019-12-17 ENCOUNTER — Ambulatory Visit: Payer: Medicare Other | Attending: Internal Medicine

## 2020-02-29 ENCOUNTER — Ambulatory Visit (INDEPENDENT_AMBULATORY_CARE_PROVIDER_SITE_OTHER): Payer: Medicare Other | Admitting: *Deleted

## 2020-02-29 DIAGNOSIS — R001 Bradycardia, unspecified: Secondary | ICD-10-CM

## 2020-02-29 LAB — CUP PACEART REMOTE DEVICE CHECK
Battery Remaining Longevity: 67 mo
Battery Voltage: 3 V
Brady Statistic AP VP Percent: 4.34 %
Brady Statistic AP VS Percent: 51.3 %
Brady Statistic AS VP Percent: 0.87 %
Brady Statistic AS VS Percent: 43.49 %
Brady Statistic RA Percent Paced: 53.72 %
Brady Statistic RV Percent Paced: 5.58 %
Date Time Interrogation Session: 20210414072953
Implantable Lead Implant Date: 20160921
Implantable Lead Implant Date: 20160921
Implantable Lead Location: 753859
Implantable Lead Location: 753860
Implantable Lead Model: 5076
Implantable Lead Model: 5076
Implantable Pulse Generator Implant Date: 20160921
Lead Channel Impedance Value: 323 Ohm
Lead Channel Impedance Value: 323 Ohm
Lead Channel Impedance Value: 361 Ohm
Lead Channel Impedance Value: 380 Ohm
Lead Channel Pacing Threshold Amplitude: 0.375 V
Lead Channel Pacing Threshold Amplitude: 1.125 V
Lead Channel Pacing Threshold Pulse Width: 0.4 ms
Lead Channel Pacing Threshold Pulse Width: 0.4 ms
Lead Channel Sensing Intrinsic Amplitude: 2.5 mV
Lead Channel Sensing Intrinsic Amplitude: 2.5 mV
Lead Channel Sensing Intrinsic Amplitude: 9.875 mV
Lead Channel Sensing Intrinsic Amplitude: 9.875 mV
Lead Channel Setting Pacing Amplitude: 2 V
Lead Channel Setting Pacing Amplitude: 2.5 V
Lead Channel Setting Pacing Pulse Width: 0.4 ms
Lead Channel Setting Sensing Sensitivity: 2.8 mV

## 2020-02-29 NOTE — Progress Notes (Signed)
PPM Remote  

## 2020-05-30 ENCOUNTER — Ambulatory Visit (INDEPENDENT_AMBULATORY_CARE_PROVIDER_SITE_OTHER): Payer: Medicare Other | Admitting: *Deleted

## 2020-05-30 DIAGNOSIS — R001 Bradycardia, unspecified: Secondary | ICD-10-CM

## 2020-05-30 LAB — CUP PACEART REMOTE DEVICE CHECK
Battery Remaining Longevity: 67 mo
Battery Voltage: 3 V
Brady Statistic AP VP Percent: 6.59 %
Brady Statistic AP VS Percent: 45.93 %
Brady Statistic AS VP Percent: 2.26 %
Brady Statistic AS VS Percent: 45.22 %
Brady Statistic RA Percent Paced: 50.4 %
Brady Statistic RV Percent Paced: 9.28 %
Date Time Interrogation Session: 20210714090200
Implantable Lead Implant Date: 20160921
Implantable Lead Implant Date: 20160921
Implantable Lead Location: 753859
Implantable Lead Location: 753860
Implantable Lead Model: 5076
Implantable Lead Model: 5076
Implantable Pulse Generator Implant Date: 20160921
Lead Channel Impedance Value: 323 Ohm
Lead Channel Impedance Value: 361 Ohm
Lead Channel Impedance Value: 361 Ohm
Lead Channel Impedance Value: 456 Ohm
Lead Channel Pacing Threshold Amplitude: 0.375 V
Lead Channel Pacing Threshold Amplitude: 0.875 V
Lead Channel Pacing Threshold Pulse Width: 0.4 ms
Lead Channel Pacing Threshold Pulse Width: 0.4 ms
Lead Channel Sensing Intrinsic Amplitude: 2.625 mV
Lead Channel Sensing Intrinsic Amplitude: 2.625 mV
Lead Channel Sensing Intrinsic Amplitude: 7.75 mV
Lead Channel Sensing Intrinsic Amplitude: 7.75 mV
Lead Channel Setting Pacing Amplitude: 2 V
Lead Channel Setting Pacing Amplitude: 2.5 V
Lead Channel Setting Pacing Pulse Width: 0.4 ms
Lead Channel Setting Sensing Sensitivity: 2.8 mV

## 2020-05-31 NOTE — Progress Notes (Signed)
Remote pacemaker transmission.   

## 2020-07-02 ENCOUNTER — Encounter: Payer: Self-pay | Admitting: Internal Medicine

## 2020-07-02 ENCOUNTER — Ambulatory Visit: Payer: Medicare Other | Admitting: Internal Medicine

## 2020-07-02 ENCOUNTER — Other Ambulatory Visit: Payer: Self-pay

## 2020-07-02 VITALS — BP 122/80 | HR 90 | Ht 76.0 in | Wt 206.0 lb

## 2020-07-02 DIAGNOSIS — I4819 Other persistent atrial fibrillation: Secondary | ICD-10-CM

## 2020-07-02 DIAGNOSIS — Z959 Presence of cardiac and vascular implant and graft, unspecified: Secondary | ICD-10-CM | POA: Diagnosis not present

## 2020-07-02 MED ORDER — APIXABAN 5 MG PO TABS: 5.0000 mg | ORAL_TABLET | Freq: Two times a day (BID) | ORAL | 3 refills | Status: DC

## 2020-07-02 MED ORDER — DOFETILIDE 500 MCG PO CAPS: 500.0000 ug | ORAL_CAPSULE | Freq: Two times a day (BID) | ORAL | 3 refills | Status: DC

## 2020-07-02 NOTE — Progress Notes (Signed)
Patient Care Team: [name], MD as PCP - General (Internal Medicine) Deboraha Sprang, MD as PCP - Cardiology (Cardiology)   HPI  Grant Moreno. is a 79 y.o. male Seen in followup for syncope with Wide QRS Tachycardia felt to be VT , noninducibility at EPS with LV dysfunction; cath normal CA 2/15;     Had Afib & RVR cx by sinus bradycardia.   Syncope was associated with profound bradycardia; monitoring demonstrated subsequent control of atrial fibrillation rates>> pacemaker implantation  Medtronic 9/16 and initiation of dofetilide.         Date Cr K Mg Hgb     5/17  0.8 4.3      4/18  0.94 4.4 2.1 13.7   9/18 0.84 4.5 2.3    3/19 0.85 4.5 2.3 14.4   5/19 0.8      11/20 0.8 4.5 2.1    7/21 0.9 4.5        DATE TEST EF   2/15 Echo   35-40 %   5/16 Echo   55-60 %        The patient denies chest pain , shortness of breath, nocturnal dyspnea, orthopnea or peripheral edema.  There have been no palpitations, lightheadedness or syncope.    Thromboembolic risk factors ( age  -2, CHF-1) for a CHADSVASc Score of >=3   Past Medical History:  Diagnosis Date  . Dysrhythmia    PAF, VT  . Hx of dysplastic nevus 10/17/2008   Lower back  . Persistent atrial fibrillation ([place])    a. on tikosyn   . 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 Sinus bradycardia    a. s/p MDT PPM  . Syncope   . Ventricular tachycardia (paroxysmal)     Past Surgical History:  Procedure Laterality Date  . BROW PTOSIS Bilateral 06/01/2018   Procedure: BROW PTOSIS;  Surgeon: Karle Starch, MD;  Location: [place];  Service: Ophthalmology;  Laterality: Bilateral;  . ELECTROPHYSIOLOGY STUDY  01-08-14   negative EPS by Dr Lovena Le following syncopal episode and documented WCT  . ELECTROPHYSIOLOGY STUDY N/A 01/09/2014   Procedure: ELECTROPHYSIOLOGY STUDY +/- ICD, +/- ILR;  Surgeon: Evans Lance, MD;  Location[facility] CATH LAB;  Service: Cardiovascular;  Laterality: N/A;  .  EP IMPLANTABLE DEVICE N/A 08/08/2015   Procedure: Pacemaker Implant;  Surgeon: Deboraha Sprang, MD;  Location: [place] CV LAB;  Service: Cardiovascular;  Laterality: N/A;  . EP IMPLANTABLE DEVICE N/A 08/08/2015   Procedure: Loop Recorder Removal;  Surgeon: Deboraha Sprang, MD;  Location: [place] CV LAB;  Service: Cardiovascular;  Laterality: N/A;  . FINGER SURGERY     Noted the only time patient has been admitted overnight.    Marland Kitchen LEFT HEART CATHETERIZATION WITH CORONARY ANGIOGRAM N/A 01/06/2014   Procedure: LEFT HEART CATHETERIZATION WITH CORONARY ANGIOGRAM;  Surgeon: Leonie Man, MD;  Location: [facility] CATH LAB;  Service: Cardiovascular;  Laterality: N/A;  . LOOP RECORDER IMPLANT  01-08-14   MDT LinQ implanted by Dr Lovena Le for syncope and WCT  . PTOSIS REPAIR Bilateral 06/01/2018   Procedure: BLEPHAROPTOSIS REPAIR RESECT;  Surgeon: Karle Starch, MD;  Location: [place];  Service: Ophthalmology;  Laterality: Bilateral;    Current Outpatient Medications  Medication Sig Dispense Refill  . apixaban (ELIQUIS) 5 MG TABS tablet Take 1 tablet (5 mg total) by mouth 2 (two) times daily. 180 tablet 3  . Cholecalciferol (VITAMIN D3)  1000 units CAPS Take 1,000 Units by mouth daily.     Marland Kitchen dofetilide (TIKOSYN) 500 MCG capsule Take 1 capsule (500 mcg total) by mouth 2 (two) times daily. 180 capsule 3  . ELDERBERRY PO Take 1 capsule by mouth daily.    . magnesium oxide (MAG-OX) 400 MG tablet Take 1 tablet (400 mg total) by mouth daily. 30 tablet 6  . Omega-3 Fatty Acids (FISH OIL) 1000 MG CAPS Take 1 capsule by mouth daily.    . pravastatin (PRAVACHOL) 20 MG tablet Take 1 tablet by mouth daily.    . timolol (TIMOPTIC) 0.5 % ophthalmic solution Place 1 drop into the right eye at bedtime.    . vitamin B-12 (CYANOCOBALAMIN) 1000 MCG tablet Take 1,000 mcg by mouth daily.    . vitamin C (ASCORBIC ACID) 500 MG tablet Take 500 mg by mouth daily.     No current facility-administered medications for  this visit.    No Known Allergies  Review of Systems negative except from HPI and PMH  Physical Exam There were no vitals taken for this visit. Well developed and well nourished in no acute distress HENT normal Neck supple with JVP-flat Clear Device pocket well healed; without hematoma or erythema.  There is no tethering  Regular rate and rhythm, no * murmur Abd-soft with active BS No Clubbing cyanosis   edema Skin-warm and dry A & Oriented  Grossly normal sensory and motor function  ECG atrial paced at 65 Intervals 20/12/40 Occasional ventricular pacing Frequent ventricular ectopy  Assessment and  Plan  Syncope.   Atrial fibrillation persistent  Sinus bradycardia/Chronotropic incompetence  Wide QRS tachycardia  PVCs  LBBB infer axis, late intrinsicoid deflection   Congestive heart failure-chronic diastolic  Nonischemic cardiomyopathy resolved   Pacemaker-Medtronic     High risk medication-dofetilide    Euvolemic continue current meds  On Anticoagulation;  No bleeding issues we will check CBC  Significant increase in ectopy.  A series of pacing maneuvers demonstrated that most of these are junctional or atrial ectopics as opposed to a scant number of ventricular ectopics.  Overall the ectopic burden has not changed appreciably over the last 21 months  We will check his magnesium on the dofetilide.  Euvolemic continue current meds  No  interval atrial fibrillation of note  No syncope

## 2020-07-02 NOTE — Patient Instructions (Addendum)
Medication Instructions:  Your physician recommends that you continue on your current medications as directed. Please refer to the Current Medication list given to you today.  *If you need a refill on your cardiac medications before your next appointment, please call your pharmacy*   Lab Work: You will have labs drawn today: CBC and Mag level today If you have labs (blood work) drawn today and your tests are completely normal, you will receive your results only by: Marland Kitchen MyChart Message (if you have MyChart) OR . A paper copy in the mail If you have any lab test that is abnormal or we need to change your treatment, we will call you to review the results.   Testing/Procedures: None ordered.    Follow-Up: At [facility], you and your health needs are our priority.  As part of our continuing mission to provide you with exceptional heart care, we have created designated Provider Care Teams.  These Care Teams include your primary Cardiologist (physician) and Advanced Practice Providers (APPs -  Physician Assistants and Nurse Practitioners) who all work together to provide you with the care you need, when you need it.  We recommend signing up for the patient portal called "MyChart".  Sign up information is provided on this After Visit Summary.  MyChart is used to connect with patients for Virtual Visits (Telemedicine).  Patients are able to view lab/test results, encounter notes, upcoming appointments, etc.  Non-urgent messages can be sent to your provider as well.   To learn more about what you can do with MyChart, go to [email or web address].    Your next appointment:  08/17/2020 at 345pm with Dr Caryl Comes

## 2020-07-03 LAB — CBC
Hematocrit: 44.6 % (ref 37.5–51.0)
Hemoglobin: 15 g/dL (ref 13.0–17.7)
MCH: 33.8 pg — ABNORMAL HIGH (ref 26.6–33.0)
MCHC: 33.6 g/dL (ref 31.5–35.7)
MCV: 101 fL — ABNORMAL HIGH (ref 79–97)
Platelets: 294 10*3/uL (ref 150–450)
RBC: 4.44 x10E6/uL (ref 4.14–5.80)
RDW: 11.8 % (ref 11.6–15.4)
WBC: 10.4 10*3/uL (ref 3.4–10.8)

## 2020-07-03 LAB — MAGNESIUM: Magnesium: 2.3 mg/dL (ref 1.6–2.3)

## 2020-08-16 DIAGNOSIS — I493 Ventricular premature depolarization: Secondary | ICD-10-CM | POA: Insufficient documentation

## 2020-08-17 ENCOUNTER — Ambulatory Visit (INDEPENDENT_AMBULATORY_CARE_PROVIDER_SITE_OTHER): Payer: Medicare Other | Admitting: Internal Medicine

## 2020-08-17 ENCOUNTER — Other Ambulatory Visit: Payer: Self-pay

## 2020-08-17 ENCOUNTER — Encounter: Payer: Self-pay | Admitting: Internal Medicine

## 2020-08-17 VITALS — BP 116/80 | HR 74 | Ht 76.0 in | Wt 212.4 lb

## 2020-08-17 DIAGNOSIS — Z959 Presence of cardiac and vascular implant and graft, unspecified: Secondary | ICD-10-CM | POA: Diagnosis not present

## 2020-08-17 DIAGNOSIS — I4819 Other persistent atrial fibrillation: Secondary | ICD-10-CM

## 2020-08-17 DIAGNOSIS — I493 Ventricular premature depolarization: Secondary | ICD-10-CM | POA: Diagnosis not present

## 2020-08-17 DIAGNOSIS — R001 Bradycardia, unspecified: Secondary | ICD-10-CM | POA: Diagnosis not present

## 2020-08-17 NOTE — Patient Instructions (Addendum)
Medication Instructions:  Your physician recommends that you continue on your current medications as directed. Please refer to the Current Medication list given to you today.  *If you need a refill on your cardiac medications before your next appointment, please call your pharmacy   Lab Work: None ordered.  If you have labs (blood work) drawn today and your tests are completely normal, you will receive your results only by: Marland Kitchen MyChart Message (if you have MyChart) OR . A paper copy in the mail If you have any lab test that is abnormal or we need to change your treatment, we will call you to review the results.   Testing/Procedures: Your physician has requested that you have an echocardiogram. Echocardiography is a painless test that uses sound waves to create images of your heart. It provides your doctor with information about the size and shape of your heart and how well your heart's chambers and valves are working. This procedure takes approximately one hour. There are no restrictions for this procedure.      Follow-Up: At [facility], you and your health needs are our priority.  As part of our continuing mission to provide you with exceptional heart care, we have created designated Provider Care Teams.  These Care Teams include your primary Cardiologist (physician) and Advanced Practice Providers (APPs -  Physician Assistants and Nurse Practitioners) who all work together to provide you with the care you need, when you need it.  We recommend signing up for the patient portal called "MyChart".  Sign up information is provided on this After Visit Summary.  MyChart is used to connect with patients for Virtual Visits (Telemedicine).  Patients are able to view lab/test results, encounter notes, upcoming appointments, etc.  Non-urgent messages can be sent to your provider as well.   To learn more about what you can do with MyChart, go to [email or web address].    Your next appointment:    12/04/2020 at 3pm

## 2020-08-17 NOTE — Progress Notes (Signed)
Patient Care Team: [name], MD as PCP - General (Internal Medicine) Deboraha Sprang, MD as PCP - Cardiology (Cardiology)   HPI  Grant Moreno. is a 79 y.o. male Seen in followup for syncope with Wide QRS Tachycardia felt to be VT , noninducibility at EPS with LV dysfunction; cath normal CA 2/15;     Had Afib & RVR cx by sinus bradycardia.   Syncope was associated with profound bradycardia; monitoring demonstrated subsequent control of atrial fibrillation rates>> pacemaker implantation  Medtronic 9/16 and initiation of dofetilide.    The patient denies chest pai, nocturnal dyspnea, orthopnea .  There have been no palpitations, lightheadedness or syncope Problems with exercise intolerance.  Fatigue and shortness of breath.  Waking up at night after couple of hours and not being able go back to sleep.  His wife says he is increasingly irritable.  He has a family history of "mess "waking him averse to thinking about sleep aids .   Thromboembolic risk factors ( age  -2, CHF-1) for a CHADSVASc Score of >=3      Date Cr K Mg Hgb     5/17  0.8 4.3      4/18  0.94 4.4 2.1 13.7   9/18 0.84 4.5 2.3    3/19 0.85 4.5 2.3 14.4   5/19 0.8      11/20 0.8 4.5 2.1    7/21 0.9 4.5        DATE TEST EF   2/15 Echo   35-40 %   5/16 Echo   55-60 %         Past Medical History:  Diagnosis Date  . Dysrhythmia    PAF, VT  . Hx of dysplastic nevus 10/17/2008   Lower back  . Persistent atrial fibrillation ([place])    a. on tikosyn   . 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 Sinus bradycardia    a. s/p MDT PPM  . Syncope   . Ventricular tachycardia (paroxysmal)     Past Surgical History:  Procedure Laterality Date  . BROW PTOSIS Bilateral 06/01/2018   Procedure: BROW PTOSIS;  Surgeon: Karle Starch, MD;  Location: [place];  Service: Ophthalmology;  Laterality: Bilateral;  . ELECTROPHYSIOLOGY STUDY  01-08-14   negative EPS by Dr Lovena Le following  syncopal episode and documented WCT  . ELECTROPHYSIOLOGY STUDY N/A 01/09/2014   Procedure: ELECTROPHYSIOLOGY STUDY +/- ICD, +/- ILR;  Surgeon: Evans Lance, MD;  Location: [facility] CATH LAB;  Service: Cardiovascular;  Laterality: N/A;  . EP IMPLANTABLE DEVICE N/A 08/08/2015   Procedure: Pacemaker Implant;  Surgeon: Deboraha Sprang, MD;  Location: [place] CV LAB;  Service: Cardiovascular;  Laterality: N/A;  . EP IMPLANTABLE DEVICE N/A 08/08/2015   Procedure: Loop Recorder Removal;  Surgeon: Deboraha Sprang, MD;  Location: [place] CV LAB;  Service: Cardiovascular;  Laterality: N/A;  . FINGER SURGERY     Noted the only time patient has been admitted overnight.    Marland Kitchen LEFT HEART CATHETERIZATION WITH CORONARY ANGIOGRAM N/A 01/06/2014   Procedure: LEFT HEART CATHETERIZATION WITH CORONARY ANGIOGRAM;  Surgeon: Leonie Man, MD;  Location: [facility] CATH LAB;  Service: Cardiovascular;  Laterality: N/A;  . LOOP RECORDER IMPLANT  01-08-14   MDT LinQ implanted by Dr Lovena Le for syncope and WCT  . PTOSIS REPAIR Bilateral 06/01/2018   Procedure: BLEPHAROPTOSIS REPAIR RESECT;  Surgeon: Karle Starch, MD;  Location: [place];  Service: Ophthalmology;  Laterality: Bilateral;    Current Outpatient Medications  Medication Sig Dispense Refill  . apixaban (ELIQUIS) 5 MG TABS tablet Take 1 tablet (5 mg total) by mouth 2 (two) times daily. 180 tablet 3  . dofetilide (TIKOSYN) 500 MCG capsule Take 1 capsule (500 mcg total) by mouth 2 (two) times daily. 180 capsule 3  . ELDERBERRY PO Take 1 capsule by mouth daily.    . magnesium oxide (MAG-OX) 400 MG tablet Take 1 tablet (400 mg total) by mouth daily. 30 tablet 6  . Multiple Vitamins-Minerals (PRESERVISION AREDS) TABS Take 2 tablets by mouth daily.    . pravastatin (PRAVACHOL) 20 MG tablet Take 1 tablet by mouth daily.    . timolol (TIMOPTIC) 0.5 % ophthalmic solution Place 1 drop into the right eye at bedtime.     No current facility-administered medications for  this visit.    No Known Allergies  Review of Systems negative except from HPI and PMH  Physical Exam BP 116/80   Pulse 74   Ht 6\' 4"  (1.93 m)   Wt 212 lb 6.4 oz (96.3 kg)   SpO2 95%   BMI 25.85 kg/m  Well developed and well nourished in no acute distress HENT normal Neck supple with JVP-flat Clear Device pocket well healed; without hematoma or erythema.  There is no tethering  Regular rate and rhythm, no  murmur Abd-soft with active BS No Clubbing cyanosis   edema Skin-warm and dry A & Oriented  Grossly normal sensory and motor function  ECG atrial pacing interspersed with junctional tachycardia  Assessment and  Plan  Syncope.   Atrial fibrillation persistent  Sinus bradycardia/Chronotropic incompetence  Wide QRS tachycardia  Junctional tachycardia  PVCs  LBBB infer axis, late intrinsicoid deflection   Congestive heart failure-chronic diastolic  Nonischemic cardiomyopathy resolved   Pacemaker-Medtronic     High risk medication-dofetilide    No atrial fibrillation to speak of, however, poorly determine frequency of junctional tachycardia of various durations.  Dofetilide clearly is not serving its purpose.  We discussed the role of amiodarone and its potential side effects.  Prior to that, however, given the fact that he has a history of resolved nonischemic cardiomyopathy, and is having junctional tachycardia which could be a trigger, we will repeat the echocardiogram prior to making changes in antiarrhythmic therapy.  Not volume overloaded today.  Wife also is concerned about his irritability and poor sleep.  May be related.  Also discussed the role of depression particularly in men.

## 2020-08-24 ENCOUNTER — Other Ambulatory Visit: Payer: Self-pay | Admitting: Internal Medicine

## 2020-08-24 LAB — CUP PACEART INCLINIC DEVICE CHECK
Battery Remaining Longevity: 60 mo
Battery Voltage: 3 V
Brady Statistic AP VP Percent: 2.69 %
Brady Statistic AP VS Percent: 50.14 %
Brady Statistic AS VP Percent: 0.89 %
Brady Statistic AS VS Percent: 46.28 %
Brady Statistic RA Percent Paced: 49.03 %
Brady Statistic RV Percent Paced: 3.77 %
Date Time Interrogation Session: 20211001153800
Implantable Lead Implant Date: 20160921
Implantable Lead Implant Date: 20160921
Implantable Lead Location: 753859
Implantable Lead Location: 753860
Implantable Lead Model: 5076
Implantable Lead Model: 5076
Implantable Pulse Generator Implant Date: 20160921
Lead Channel Impedance Value: 342 Ohm
Lead Channel Impedance Value: 361 Ohm
Lead Channel Impedance Value: 380 Ohm
Lead Channel Impedance Value: 475 Ohm
Lead Channel Pacing Threshold Amplitude: 0.75 V
Lead Channel Pacing Threshold Amplitude: 1 V
Lead Channel Pacing Threshold Pulse Width: 0.4 ms
Lead Channel Pacing Threshold Pulse Width: 0.4 ms
Lead Channel Sensing Intrinsic Amplitude: 3.875 mV
Lead Channel Sensing Intrinsic Amplitude: 9.75 mV
Lead Channel Setting Pacing Amplitude: 2 V
Lead Channel Setting Pacing Amplitude: 2.5 V
Lead Channel Setting Pacing Pulse Width: 0.4 ms
Lead Channel Setting Sensing Sensitivity: 2.8 mV

## 2020-08-24 NOTE — Telephone Encounter (Signed)
Pt last saw Dr Caryl Comes 08/17/20, last labs 06/28/20 Creat 0.9 at [facility] per care everywhere, age 79, weight 96.3kg, based on specified criteria pt is on appropriate dosage of Eliquis 5mg  BID.  Will refill rx.

## 2020-09-06 ENCOUNTER — Other Ambulatory Visit: Payer: Self-pay

## 2020-09-06 ENCOUNTER — Ambulatory Visit (HOSPITAL_COMMUNITY): Payer: Medicare Other | Attending: Cardiology

## 2020-09-06 DIAGNOSIS — I493 Ventricular premature depolarization: Secondary | ICD-10-CM

## 2020-09-06 DIAGNOSIS — I4819 Other persistent atrial fibrillation: Secondary | ICD-10-CM

## 2020-09-06 LAB — ECHOCARDIOGRAM COMPLETE
Area-P 1/2: 3.37 cm2
P 1/2 time: 1266 msec
S' Lateral: 3.9 cm

## 2020-09-07 ENCOUNTER — Ambulatory Visit (INDEPENDENT_AMBULATORY_CARE_PROVIDER_SITE_OTHER): Payer: Medicare Other

## 2020-09-07 DIAGNOSIS — R001 Bradycardia, unspecified: Secondary | ICD-10-CM

## 2020-09-08 LAB — CUP PACEART REMOTE DEVICE CHECK
Battery Remaining Longevity: 58 mo
Battery Voltage: 2.99 V
Brady Statistic AP VP Percent: 3.54 %
Brady Statistic AP VS Percent: 51.83 %
Brady Statistic AS VP Percent: 1.58 %
Brady Statistic AS VS Percent: 43.05 %
Brady Statistic RA Percent Paced: 51.69 %
Brady Statistic RV Percent Paced: 5.44 %
Date Time Interrogation Session: 20211022132405
Implantable Lead Implant Date: 20160921
Implantable Lead Implant Date: 20160921
Implantable Lead Location: 753859
Implantable Lead Location: 753860
Implantable Lead Model: 5076
Implantable Lead Model: 5076
Implantable Pulse Generator Implant Date: 20160921
Lead Channel Impedance Value: 323 Ohm
Lead Channel Impedance Value: 361 Ohm
Lead Channel Impedance Value: 361 Ohm
Lead Channel Impedance Value: 437 Ohm
Lead Channel Pacing Threshold Amplitude: 0.375 V
Lead Channel Pacing Threshold Amplitude: 1 V
Lead Channel Pacing Threshold Pulse Width: 0.4 ms
Lead Channel Pacing Threshold Pulse Width: 0.4 ms
Lead Channel Sensing Intrinsic Amplitude: 2.375 mV
Lead Channel Sensing Intrinsic Amplitude: 2.375 mV
Lead Channel Sensing Intrinsic Amplitude: 7.625 mV
Lead Channel Sensing Intrinsic Amplitude: 7.625 mV
Lead Channel Setting Pacing Amplitude: 2 V
Lead Channel Setting Pacing Amplitude: 2.5 V
Lead Channel Setting Pacing Pulse Width: 0.4 ms
Lead Channel Setting Sensing Sensitivity: 2.8 mV

## 2020-09-12 NOTE — Progress Notes (Signed)
Remote pacemaker transmission.   

## 2020-09-19 ENCOUNTER — Telehealth: Payer: Self-pay

## 2020-09-19 MED ORDER — DRONEDARONE HCL 400 MG PO TABS: 400.0000 mg | ORAL_TABLET | Freq: Two times a day (BID) | ORAL | 0 refills | Status: DC

## 2020-09-19 NOTE — Telephone Encounter (Signed)
Attempted phone call to pt.  Left voicemail message to contact RN at [phone]. 

## 2020-09-19 NOTE — Telephone Encounter (Signed)
Spoke with pt who is agreeable to begin Multaq 400mg  bid per Dr Olin Pia recommendation.  Pt will discontinue Tikosyn x 5 days prior to beginning Multaq.  Order place for Multaq, however pt prefers to pick up hard copy of Rx as he is unsure where he will have medication filled.  Pt will pick up samples 09/20/2020.

## 2020-09-19 NOTE — Telephone Encounter (Deleted)
-----   Message from Deboraha Sprang, MD sent at 09/11/2020  7:56 PM EDT ----- Please Inform Patient Echo showed  normal  heart muscle function  For the extra beats we have then numerous options-- previously had not tolerated flecainide so can not use it or its cousin( propafenone) but could try either multaq or amiod Given the side effects of the latter lets try 400 bid dronaderone first after we stop dofetilide for aobout 5 days Thanks SK

## 2020-09-19 NOTE — Telephone Encounter (Signed)
Spoke with pt and advised per Dr Caryl Comes echo shows normal heart muscle function.  Dr Caryl Comes would like pt to start Multaq 400mg  bid after stopping Dofetilide x 5 days.  Pt verbalizes understanding and will contact his insurance company.  Pt advised samples will be placed up front for pick up.  Pt verbalizes understanding and agrees with current plan.

## 2020-09-19 NOTE — Telephone Encounter (Signed)
-----   Message from Deboraha Sprang, MD sent at 09/11/2020  7:56 PM EDT ----- Please Inform Patient Echo showed  normal  heart muscle function  For the extra beats we have then numerous options-- previously had not tolerated flecainide so can not use it or its cousin( propafenone) but could try either multaq or amiod Given the side effects of the latter lets try 400 bid dronaderone first after we stop dofetilide for aobout 5 days Thanks SK

## 2020-09-20 MED ORDER — DRONEDARONE HCL 400 MG PO TABS
400.0000 mg | ORAL_TABLET | Freq: Two times a day (BID) | ORAL | 0 refills | Status: DC
Start: 2020-09-20 — End: 2020-09-20

## 2020-09-20 MED ORDER — DRONEDARONE HCL 400 MG PO TABS
400.0000 mg | ORAL_TABLET | Freq: Two times a day (BID) | ORAL | 0 refills | Status: DC
Start: 2020-09-20 — End: 2020-12-05

## 2020-09-20 NOTE — Addendum Note (Signed)
Addended by: Rodman Key on: 09/20/2020 11:01 AM   Modules accepted: Orders

## 2020-09-20 NOTE — Addendum Note (Signed)
Addended by: Thora Lance on: 09/20/2020 10:55 AM   Modules accepted: Orders

## 2020-10-30 ENCOUNTER — Other Ambulatory Visit: Payer: Self-pay

## 2020-10-30 ENCOUNTER — Ambulatory Visit (INDEPENDENT_AMBULATORY_CARE_PROVIDER_SITE_OTHER): Payer: Medicare Other | Admitting: Dermatology

## 2020-10-30 DIAGNOSIS — L570 Actinic keratosis: Secondary | ICD-10-CM

## 2020-10-30 DIAGNOSIS — Z1283 Encounter for screening for malignant neoplasm of skin: Secondary | ICD-10-CM | POA: Diagnosis not present

## 2020-10-30 DIAGNOSIS — L814 Other melanin hyperpigmentation: Secondary | ICD-10-CM

## 2020-10-30 DIAGNOSIS — L821 Other seborrheic keratosis: Secondary | ICD-10-CM

## 2020-10-30 DIAGNOSIS — L578 Other skin changes due to chronic exposure to nonionizing radiation: Secondary | ICD-10-CM

## 2020-10-30 DIAGNOSIS — L720 Epidermal cyst: Secondary | ICD-10-CM | POA: Diagnosis not present

## 2020-10-30 DIAGNOSIS — L853 Xerosis cutis: Secondary | ICD-10-CM

## 2020-10-30 DIAGNOSIS — D1800 Hemangioma unspecified site: Secondary | ICD-10-CM

## 2020-10-30 DIAGNOSIS — D229 Melanocytic nevi, unspecified: Secondary | ICD-10-CM

## 2020-10-30 NOTE — Patient Instructions (Signed)

## 2020-10-30 NOTE — Progress Notes (Signed)
Follow-Up Visit   Subjective  Grant Toops. is a 79 y.o. male who presents for the following: Annual Exam (No hx of skin cancer. Hx of moderate dysplastic nevus (2009)).  Pt states that he has small rough spots on his arms and face that he reports are bothersome.   The following portions of the chart were reviewed this encounter and updated as appropriate:      Review of Systems: No other skin or systemic complaints except as noted in HPI or Assessment and Plan.   Objective  Well appearing patient in no apparent distress; mood and affect are within normal limits.  A full examination was performed including scalp, head, eyes, ears, nose, lips, neck, chest, axillae, abdomen, back, buttocks, bilateral upper extremities, bilateral lower extremities, hands, feet, fingers, toes, fingernails, and toenails. All findings within normal limits unless otherwise noted below.  Objective  R Hand dorsum x 5 , L hand x 4, L upper elbow x 2, R elbow x 1, L temple hairline x 1, R forehead x 1, R temple x 1, L crown x 1, scalp x 5, L ear helix x 2, L forehead x 2, R ear helix x 1, L upper arm x 7, L forearm x 1, R upper arm x 1 (35): Pink keratotic macules and papules  Objective  Upper Mid Back: Subcutaneous nodule.   Assessment & Plan  AK (actinic keratosis) (35) R Hand dorsum x 5 , L hand x 4, L upper elbow x 2, R elbow x 1, L temple hairline x 1, R forehead x 1, R temple x 1, L crown x 1, scalp x 5, L ear helix x 2, L forehead x 2, R ear helix x 1, L upper arm x 7, L forearm x 1, R upper arm x 1  Hypertrophic.   Prior to procedure, discussed risks of blister formation, small wound, skin dyspigmentation, or rare scar following cryotherapy.    Destruction of lesion - R Hand dorsum x 5 , L hand x 4, L upper elbow x 2, R elbow x 1, L temple hairline x 1, R forehead x 1, R temple x 1, L crown x 1, scalp x 5, L ear helix x 2, L forehead x 2, R ear helix x 1, L upper arm x 7, L forearm x 1, R upper arm  x 1  Destruction method: cryotherapy   Informed consent: discussed and consent obtained   Lesion destroyed using liquid nitrogen: Yes   Region frozen until ice ball extended beyond lesion: Yes   Outcome: patient tolerated procedure well with no complications   Post-procedure details: wound care instructions given    Epidermal cyst Upper Mid Back  Benign-appearing. Exam most consistent with an epidermal inclusion cyst. Discussed that a cyst is a benign growth that can grow over time and sometimes get irritated or inflamed. Recommend observation if it is not bothersome. Discussed option of surgical excision to remove it if it is growing, symptomatic, or other changes noted. Please call for new or changing lesions so they can be evaluated.      Lentigines - Scattered tan macules - Discussed due to sun exposure - Benign, observe - Call for any changes  Seborrheic Keratoses - Stuck-on, waxy, tan-brown papules and plaques  - Discussed benign etiology and prognosis. - Observe - Call for any changes  Melanocytic Nevi - Tan-brown and/or pink-flesh-colored symmetric macules and papules - Benign appearing on exam today - Observation - Call clinic for  new or changing moles - Recommend daily use of broad spectrum spf 30+ sunscreen to sun-exposed areas.   Hemangiomas - Red papules - Discussed benign nature - Observe - Call for any changes  Actinic Damage - Chronic, secondary to cumulative UV/sun exposure - diffuse scaly erythematous macules with underlying dyspigmentation - Recommend daily broad spectrum sunscreen SPF 30+ to sun-exposed areas, reapply every 2 hours as needed.  - Call for new or changing lesions.  Xerosis - diffuse xerotic patches - recommend gentle, hydrating skin care - gentle skin care handout given  Skin cancer screening performed today.  Return in about 1 year (around 10/30/2021) for TBSE.   I, Harriett Sine, CMA, am acting as scribe for Brendolyn Patty,  MD.  Documentation: I have reviewed the above documentation for accuracy and completeness, and I agree with the above.  Brendolyn Patty MD

## 2020-11-29 ENCOUNTER — Telehealth: Payer: Self-pay | Admitting: Internal Medicine

## 2020-11-29 NOTE — Telephone Encounter (Signed)
    pt said he missed a call from [place]. He said if she wants to talk to him to call him back

## 2020-11-29 NOTE — Telephone Encounter (Signed)
Patient called to assess needs.  He expressed that he called because, he thought he had a missed call from our office.  I could not locate a recent encounter from staff.  I assessed patient's needs he denied needing anything at this time.  Expressed that he will be here for appointment on 12/04/20 @3pm  with Dr. Caryl Comes.

## 2020-12-04 ENCOUNTER — Encounter: Payer: Medicare Other | Admitting: Internal Medicine

## 2020-12-05 ENCOUNTER — Other Ambulatory Visit: Payer: Self-pay

## 2020-12-05 ENCOUNTER — Encounter: Payer: Self-pay | Admitting: Internal Medicine

## 2020-12-05 ENCOUNTER — Ambulatory Visit: Payer: Medicare Other | Admitting: Internal Medicine

## 2020-12-05 VITALS — BP 118/74 | HR 65 | Ht 76.0 in | Wt 210.0 lb

## 2020-12-05 DIAGNOSIS — Z959 Presence of cardiac and vascular implant and graft, unspecified: Secondary | ICD-10-CM | POA: Diagnosis not present

## 2020-12-05 DIAGNOSIS — R001 Bradycardia, unspecified: Secondary | ICD-10-CM

## 2020-12-05 DIAGNOSIS — I4819 Other persistent atrial fibrillation: Secondary | ICD-10-CM | POA: Diagnosis not present

## 2020-12-05 DIAGNOSIS — I493 Ventricular premature depolarization: Secondary | ICD-10-CM | POA: Diagnosis not present

## 2020-12-05 NOTE — Patient Instructions (Addendum)
Medication Instructions:  Your physician has recommended you make the following change in your medication:  1. STOP Multaq  *If you need a refill on your cardiac medications before your next appointment, please call your pharmacy*   Lab Work: None ordered   Testing/Procedures: None ordered   Follow-Up: At [facility], you and your health needs are our priority.  As part of our continuing mission to provide you with exceptional heart care, we have created designated Provider Care Teams.  These Care Teams include your primary Cardiologist (physician) and Advanced Practice Providers (APPs -  Physician Assistants and Nurse Practitioners) who all work together to provide you with the care you need, when you need it.  Your next appointment:   4 month(s)  The format for your next appointment:   In Person  Provider:   Virl Axe, MD    Thank you for choosing [facility]!!

## 2020-12-05 NOTE — Progress Notes (Signed)
Patient Care Team: [name], MD as PCP - General (Internal Medicine) Deboraha Sprang, MD as PCP - Cardiology (Cardiology)   HPI  Grant Quest. is a 80 y.o. male Seen in followup for syncope with Wide QRS Tachycardia felt to be VT , noninducibility at EPS with LV dysfunction; cath normal CA 2/15;     Had Afib & RVR cx by sinus bradycardia.   Syncope was associated with profound bradycardia; monitoring demonstrated subsequent control of atrial fibrillation rates>> pacemaker implantation  Medtronic 9/16 and initiation of dofetilide.  Subsequently stopped.  At previous visit had been complaining of dyspnea with exertion.  Found to have a junctional tachycardia so we tried him on dronaderone.  Has noted no improvement in symptoms and indeed as we chat says shortness of breath is really not an issue.  Device interrogation demonstrates increased atrial fibrillation burden.   No bleeding. .   Thromboembolic risk factors ( age  -2, CHF-1) for a CHADSVASc Score of >=3      Date Cr K Mg Hgb     5/17  0.8 4.3      4/18  0.94 4.4 2.1 13.7   9/18 0.84 4.5 2.3    3/19 0.85 4.5 2.3 14.4   5/19 0.8      11/20 0.8 4.5 2.1    7/21 0.9 4.5        DATE TEST EF   2/15 Echo   35-40 %   5/16 Echo   55-60 %   10/21 Echo  50-55%    Past Medical History:  Diagnosis Date  . Dysrhythmia    PAF, VT  . Hx of dysplastic nevus 10/17/2008   Lower back. Slight to moderate atypia.  . Persistent atrial fibrillation ([place])    a. on tikosyn   . 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 Sinus bradycardia    a. s/p MDT PPM  . Syncope   . Ventricular tachycardia (paroxysmal)     Past Surgical History:  Procedure Laterality Date  . BROW PTOSIS Bilateral 06/01/2018   Procedure: BROW PTOSIS;  Surgeon: Karle Starch, MD;  Location: [place];  Service: Ophthalmology;  Laterality: Bilateral;  . ELECTROPHYSIOLOGY STUDY  01-08-14   negative EPS by Dr Lovena Le following  syncopal episode and documented WCT  . ELECTROPHYSIOLOGY STUDY N/A 01/09/2014   Procedure: ELECTROPHYSIOLOGY STUDY +/- ICD, +/- ILR;  Surgeon: Evans Lance, MD;  Location: [facility] CATH LAB;  Service: Cardiovascular;  Laterality: N/A;  . EP IMPLANTABLE DEVICE N/A 08/08/2015   Procedure: Pacemaker Implant;  Surgeon: Deboraha Sprang, MD;  Location: [place] CV LAB;  Service: Cardiovascular;  Laterality: N/A;  . EP IMPLANTABLE DEVICE N/A 08/08/2015   Procedure: Loop Recorder Removal;  Surgeon: Deboraha Sprang, MD;  Location: [place] CV LAB;  Service: Cardiovascular;  Laterality: N/A;  . FINGER SURGERY     Noted the only time patient has been admitted overnight.    Marland Kitchen LEFT HEART CATHETERIZATION WITH CORONARY ANGIOGRAM N/A 01/06/2014   Procedure: LEFT HEART CATHETERIZATION WITH CORONARY ANGIOGRAM;  Surgeon: Leonie Man, MD;  Location[facility] CATH LAB;  Service: Cardiovascular;  Laterality: N/A;  . LOOP RECORDER IMPLANT  01-08-14   MDT LinQ implanted by Dr Lovena Le for syncope and WCT  . PTOSIS REPAIR Bilateral 06/01/2018   Procedure: BLEPHAROPTOSIS REPAIR RESECT;  Surgeon: Karle Starch, MD;  Location: [place];  Service: Ophthalmology;  Laterality: Bilateral;  Current Outpatient Medications  Medication Sig Dispense Refill  . dronedarone (MULTAQ) 400 MG tablet Take 1 tablet (400 mg total) by mouth 2 (two) times daily with a meal. 180 tablet 0  . ELDERBERRY PO Take 1 capsule by mouth daily.    Marland Kitchen ELIQUIS 5 MG TABS tablet TAKE 1 TABLET BY MOUTH TWICE DAILY 180 tablet 1  . magnesium oxide (MAG-OX) 400 MG tablet Take 1 tablet (400 mg total) by mouth daily. 30 tablet 6  . Multiple Vitamins-Minerals (PRESERVISION AREDS) TABS Take 2 tablets by mouth daily.    . pravastatin (PRAVACHOL) 20 MG tablet Take 10 mg by mouth daily.    . timolol (TIMOPTIC) 0.5 % ophthalmic solution Place 1 drop into the right eye at bedtime.     No current facility-administered medications for this visit.    No Known  Allergies  Review of Systems negative except from HPI and PMH  Physical Exam BP 118/74   Pulse 65   Ht 6\' 4"  (1.93 m)   Wt 210 lb (95.3 kg)   SpO2 96%   BMI 25.56 kg/m  Well developed and well nourished in no acute distress HENT normal Neck supple with JVP-flat Clear Device pocket well healed; without hematoma or erythema.  There is no tethering  Regular rate and rhythm, no murmur Abd-soft with active BS No Clubbing cyanosis  edema Skin-warm and dry A & Oriented  Grossly normal sensory and motor function  ECG sinus at 65 Intervals 19/12/41 Left axis deviation of -61  Atrial fibrillation burden 7% up from about 0%  Assessment and  Plan  Syncope.   Atrial fibrillation persistent  Sinus bradycardia/Chronotropic incompetence  Wide QRS tachycardia  Junctional tachycardia  PVCs  LBBB infer axis, late intrinsicoid deflection   Congestive heart failure-chronic diastolic  Nonischemic cardiomyopathy resolved   Pacemaker-Medtronic     High risk medication-dofetilide    Significant increase interval atrial fibrillation.  Noted no change in symptoms associated with the dronaderone; hence, we will stop it.  Not sure at this point how symptomatic the rhythms are.  Hence we will not reinitiate antiarrhythmic therapy.  Heart rates have not been excessively fast so we will not attempt beta-blockers.  In the past he has been poorly tolerant of beta-blockers with syncope.  No bleeding.  Continue anticoagulation  Not withstanding the recovery of his LV function, given the blood pressure issues and his history of syncope related to medications, will hold off, particularly given the fact that his LV function has been stable now for about half a decade  He is euvolemic.

## 2020-12-07 ENCOUNTER — Ambulatory Visit (INDEPENDENT_AMBULATORY_CARE_PROVIDER_SITE_OTHER): Payer: Medicare Other

## 2020-12-07 DIAGNOSIS — I4729 Other ventricular tachycardia: Secondary | ICD-10-CM

## 2020-12-07 DIAGNOSIS — I472 Ventricular tachycardia: Secondary | ICD-10-CM

## 2020-12-07 LAB — CUP PACEART REMOTE DEVICE CHECK
Battery Remaining Longevity: 58 mo
Battery Voltage: 2.99 V
Brady Statistic AP VP Percent: 14.69 %
Brady Statistic AP VS Percent: 54.25 %
Brady Statistic AS VP Percent: 1.84 %
Brady Statistic AS VS Percent: 29.22 %
Brady Statistic RA Percent Paced: 64.75 %
Brady Statistic RV Percent Paced: 16.88 %
Date Time Interrogation Session: 20220121090552
Implantable Lead Implant Date: 20160921
Implantable Lead Implant Date: 20160921
Implantable Lead Location: 753859
Implantable Lead Location: 753860
Implantable Lead Model: 5076
Implantable Lead Model: 5076
Implantable Pulse Generator Implant Date: 20160921
Lead Channel Impedance Value: 304 Ohm
Lead Channel Impedance Value: 342 Ohm
Lead Channel Impedance Value: 342 Ohm
Lead Channel Impedance Value: 437 Ohm
Lead Channel Pacing Threshold Amplitude: 0.5 V
Lead Channel Pacing Threshold Amplitude: 1.125 V
Lead Channel Pacing Threshold Pulse Width: 0.4 ms
Lead Channel Pacing Threshold Pulse Width: 0.4 ms
Lead Channel Sensing Intrinsic Amplitude: 2 mV
Lead Channel Sensing Intrinsic Amplitude: 2 mV
Lead Channel Sensing Intrinsic Amplitude: 8 mV
Lead Channel Sensing Intrinsic Amplitude: 8 mV
Lead Channel Setting Pacing Amplitude: 2 V
Lead Channel Setting Pacing Amplitude: 2.5 V
Lead Channel Setting Pacing Pulse Width: 0.4 ms
Lead Channel Setting Sensing Sensitivity: 2.8 mV

## 2020-12-19 NOTE — Progress Notes (Signed)
Remote pacemaker transmission.   

## 2020-12-27 ENCOUNTER — Other Ambulatory Visit: Payer: Self-pay | Admitting: Internal Medicine

## 2021-03-08 ENCOUNTER — Ambulatory Visit (INDEPENDENT_AMBULATORY_CARE_PROVIDER_SITE_OTHER): Payer: Medicare Other

## 2021-03-08 DIAGNOSIS — I495 Sick sinus syndrome: Secondary | ICD-10-CM

## 2021-03-09 LAB — CUP PACEART REMOTE DEVICE CHECK
Battery Remaining Longevity: 35 mo
Battery Voltage: 2.97 V
Brady Statistic AP VP Percent: 2.09 %
Brady Statistic AP VS Percent: 4.78 %
Brady Statistic AS VP Percent: 25.75 %
Brady Statistic AS VS Percent: 67.39 %
Brady Statistic RA Percent Paced: 4.23 %
Brady Statistic RV Percent Paced: 28.74 %
Date Time Interrogation Session: 20220422101235
Implantable Lead Implant Date: 20160921
Implantable Lead Implant Date: 20160921
Implantable Lead Location: 753859
Implantable Lead Location: 753860
Implantable Lead Model: 5076
Implantable Lead Model: 5076
Implantable Pulse Generator Implant Date: 20160921
Lead Channel Impedance Value: 304 Ohm
Lead Channel Impedance Value: 323 Ohm
Lead Channel Impedance Value: 361 Ohm
Lead Channel Impedance Value: 456 Ohm
Lead Channel Pacing Threshold Amplitude: 0.375 V
Lead Channel Pacing Threshold Amplitude: 1.25 V
Lead Channel Pacing Threshold Pulse Width: 0.4 ms
Lead Channel Pacing Threshold Pulse Width: 0.4 ms
Lead Channel Sensing Intrinsic Amplitude: 0.75 mV
Lead Channel Sensing Intrinsic Amplitude: 0.75 mV
Lead Channel Sensing Intrinsic Amplitude: 7.375 mV
Lead Channel Sensing Intrinsic Amplitude: 7.375 mV
Lead Channel Setting Pacing Amplitude: 2 V
Lead Channel Setting Pacing Amplitude: 2.5 V
Lead Channel Setting Pacing Pulse Width: 0.4 ms
Lead Channel Setting Sensing Sensitivity: 2.8 mV

## 2021-03-11 ENCOUNTER — Telehealth: Payer: Self-pay

## 2021-03-11 NOTE — Telephone Encounter (Signed)
Scheduled PPM transmission- Scheduled remote reviewed. Normal device function.   The  patient has been in a persistent AF since 12/16/2020, on [place].  Ventricular rates at times are fast with persistent AF.     Per 12/05/20 OV notes, MD aware of persistent AF with RVR at times.  At time of visit Multaq was stopped due to ineffective relief of symptoms. No plan to restart anti-arrythmics as pt was asymptomatic.    Attempted to reach pt to assess current symptoms.  No answer, LVM requesting callback to device clinic.

## 2021-03-12 NOTE — Telephone Encounter (Signed)
Patient called back and would like a call back. I let patient know a nurse will call him back

## 2021-03-12 NOTE — Telephone Encounter (Signed)
Returned patient phone call. Patient reports he has felt well overall. He does state he is not sleeping well at night and when he walks to get his trash can he experiences increased fatigued. Reports since stopping medications at last OV, he has not seen much change in how he feels. As far as any palpitations he reports he is asymptomatic. Patient remains compliant with Eliquis 5 mg BID. Patient has in-clinic check with Dr. Caryl Comes 04/09/21, patient aware. Advised patient I will forward to Dr. Caryl Comes and we will call back with any changes.

## 2021-03-16 NOTE — Telephone Encounter (Signed)
Heather and Leigh HR excursion is significantly right shifted at least part of which may be related to the discontinuation of .dronaderone.  can you check and see if he is on and dose of a beta blocker  Thanks SK

## 2021-03-18 NOTE — Telephone Encounter (Signed)
Attempted to call the patient on his cell/ preferred #. No answer- I left a message to please call back.

## 2021-03-19 NOTE — Telephone Encounter (Signed)
Spoke with patient .  He states he is not taking a beta blocker.  Patient currently taking Eliquis, pravastatin, and timolol drops.  Patient states he feels much better after dc multaq and has seen improvement in energy and breathing.     Patient aware this will be passed on and if call back needed he can be reached via cell [phone] while driving to [place] today or after lunch at [facility] [phone].

## 2021-03-19 NOTE — Telephone Encounter (Signed)
The patient called back to the office this morning and spoke with scheduling. Per [name] the patient advised he is not taking a beta blocker.  I have asked her to notify the patient this was the only thing I needed to know right now. We will notify Dr. Caryl Comes.

## 2021-03-19 NOTE — Telephone Encounter (Signed)
Noted- [place] patient. To Dr. Clayburn Pert, RN

## 2021-03-20 NOTE — Telephone Encounter (Signed)
Spoke with pt and advised per Dr Caryl Comes he is recommending Diltiazem CD 120mg  daily.  Pt declines medication and states he feels the best he has felt in many years and would prefer to wait until his OV on 04/09/2021 with Dr Caryl Comes.  Pt states he has attempted Metoprolol and Coreg in the past with both causing syncope with collapse.  Pt advised will forward his decision to Dr Caryl Comes and reviewed ED precautions.  Pt verbalizes understanding and thanked Therapist, sports for the call.

## 2021-03-20 NOTE — Telephone Encounter (Signed)
M  GM 2 U from [place]  Can we try Mr Gambrell on low dose dilt 120 CD daily and see if it improves his exercise without making him feel worse  If we could also arrange for a transmission in about 6 weeks to assess effectiveness of HR control and correlation with symptoms   Thanks SK

## 2021-03-25 ENCOUNTER — Encounter: Payer: Self-pay | Admitting: Internal Medicine

## 2021-03-25 MED ORDER — DILTIAZEM HCL ER COATED BEADS 120 MG PO CP24: 120.0000 mg | ORAL_CAPSULE | Freq: Every day | ORAL | 11 refills | Status: DC

## 2021-03-25 NOTE — Telephone Encounter (Signed)
error 

## 2021-03-25 NOTE — Addendum Note (Signed)
Addended by: Willeen Cass A on: 03/25/2021 08:26 AM   Modules accepted: Orders

## 2021-03-25 NOTE — Telephone Encounter (Signed)
Patient states he would like a call back from [place] as soon as possible. He states he had discussed waiting to start a new medication and would like to discuss starting it now. He states if he does not answer his mobile number to call: [phone]

## 2021-03-25 NOTE — Telephone Encounter (Signed)
Call returned to Pt.  He would like to try diltiazem.  Order placed.  Pt thanked nurse for call back.

## 2021-03-27 NOTE — Progress Notes (Signed)
Remote pacemaker transmission.   

## 2021-04-09 ENCOUNTER — Ambulatory Visit (INDEPENDENT_AMBULATORY_CARE_PROVIDER_SITE_OTHER): Payer: Medicare Other | Admitting: Internal Medicine

## 2021-04-09 ENCOUNTER — Encounter: Payer: Self-pay | Admitting: Internal Medicine

## 2021-04-09 ENCOUNTER — Other Ambulatory Visit: Payer: Self-pay

## 2021-04-09 VITALS — BP 124/70 | HR 73 | Ht 76.0 in | Wt 210.0 lb

## 2021-04-09 DIAGNOSIS — I4819 Other persistent atrial fibrillation: Secondary | ICD-10-CM | POA: Diagnosis not present

## 2021-04-09 DIAGNOSIS — I495 Sick sinus syndrome: Secondary | ICD-10-CM | POA: Diagnosis not present

## 2021-04-09 DIAGNOSIS — I428 Other cardiomyopathies: Secondary | ICD-10-CM

## 2021-04-09 DIAGNOSIS — Z959 Presence of cardiac and vascular implant and graft, unspecified: Secondary | ICD-10-CM

## 2021-04-09 DIAGNOSIS — R001 Bradycardia, unspecified: Secondary | ICD-10-CM | POA: Diagnosis not present

## 2021-04-09 DIAGNOSIS — I493 Ventricular premature depolarization: Secondary | ICD-10-CM | POA: Diagnosis not present

## 2021-04-09 MED ORDER — DILTIAZEM HCL ER COATED BEADS 120 MG PO CP24: 120.0000 mg | ORAL_CAPSULE | Freq: Every day | ORAL | 3 refills | Status: DC

## 2021-04-09 NOTE — Progress Notes (Signed)
Patient Care Team: [name], MD as PCP - General (Internal Medicine) Deboraha Sprang, MD as PCP - Cardiology (Cardiology)   HPI  Grant Moreno. is a 80 y.o. male Seen in followup for syncope with Wide QRS Tachycardia felt to be VT , noninducibility at EPS with LV dysfunction; cath normal CA 2/15; interval  Afib & RVR cx by sinus bradycardia.  Recent increase in his atrial fibrillation burden unimproved by dronaderone and not clearly worse so elected to stop antiarrhythmic therapy.  Previously had failed dofetilide.    Syncope was associated with profound bradycardia; monitoring demonstrated subsequent control of atrial fibrillation rates>> pacemaker implantation  Medtronic 9/16 and initiation of dofetilide.  Subsequently stopped.  At previous visit had been complaining of dyspnea with exertion.  Found to have a junctional tachycardia so we tried him on dronaderone.  Has noted no improvement in symptoms and indeed as we chat says shortness of breath is really not an issue.  Device interrogation demonstrates increased atrial fibrillation burden.>> now persistent   No bleeding  The patient denies chest pain, shortness of breath, nocturnal dyspnea, orthopnea or peripheral edema.  There have been no palpitations, lightheadedness or syncope.       Date Cr K Mg Hgb     5/17  0.8 4.3      4/18  0.94 4.4 2.1 13.7   9/18 0.84 4.5 2.3    3/19 0.85 4.5 2.3 14.4   5/19 0.8      11/20 0.8 4.5 2.1    7/21 0.9 4.5        DATE TEST EF   2/15 Echo   35-40 %   5/16 Echo   55-60 %   10/21 Echo  50-55%   .   Thromboembolic risk factors ( age  -2, CHF-1) for a CHADSVASc Score of >=3   Past Medical History:  Diagnosis Date  . Dysrhythmia    PAF, VT  . Hx of dysplastic nevus 10/17/2008   Lower back. Slight to moderate atypia.  . Persistent atrial fibrillation ([place])    a. on tikosyn   . 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 Sinus bradycardia    a. s/p MDT  PPM  . Syncope   . Ventricular tachycardia (paroxysmal)     Past Surgical History:  Procedure Laterality Date  . BROW PTOSIS Bilateral 06/01/2018   Procedure: BROW PTOSIS;  Surgeon: Karle Starch, MD;  Location: [place];  Service: Ophthalmology;  Laterality: Bilateral;  . ELECTROPHYSIOLOGY STUDY  01-08-14   negative EPS by Dr Lovena Le following syncopal episode and documented WCT  . ELECTROPHYSIOLOGY STUDY N/A 01/09/2014   Procedure: ELECTROPHYSIOLOGY STUDY +/- ICD, +/- ILR;  Surgeon: Evans Lance, MD;  [facility]: [facility] CATH LAB;  Service: Cardiovascular;  Laterality: N/A;  . EP IMPLANTABLE DEVICE N/A 08/08/2015   Procedure: Pacemaker Implant;  Surgeon: Deboraha Sprang, MD;  Location: [place] CV LAB;  Service: Cardiovascular;  Laterality: N/A;  . EP IMPLANTABLE DEVICE N/A 08/08/2015   Procedure: Loop Recorder Removal;  Surgeon: Deboraha Sprang, MD;  Location: [place] CV LAB;  Service: Cardiovascular;  Laterality: N/A;  . FINGER SURGERY     Noted the only time patient has been admitted overnight.    Marland Kitchen LEFT HEART CATHETERIZATION WITH CORONARY ANGIOGRAM N/A 01/06/2014   Procedure: LEFT HEART CATHETERIZATION WITH CORONARY ANGIOGRAM;  Surgeon: Leonie Man, MD;  Location[facility] LAB;  Service: Cardiovascular;  Laterality: N/A;  . LOOP RECORDER IMPLANT  01-08-14   MDT LinQ implanted by Dr Lovena Le for syncope and WCT  . PTOSIS REPAIR Bilateral 06/01/2018   Procedure: BLEPHAROPTOSIS REPAIR RESECT;  Surgeon: Karle Starch, MD;  Location: [place];  Service: Ophthalmology;  Laterality: Bilateral;    Current Outpatient Medications  Medication Sig Dispense Refill  . diltiazem (CARDIZEM CD) 120 MG 24 hr capsule Take 1 capsule (120 mg total) by mouth daily. 30 capsule 11  . ELDERBERRY PO Take 1 capsule by mouth daily.    Marland Kitchen ELIQUIS 5 MG TABS tablet TAKE 1 TABLET BY MOUTH TWICE DAILY 180 tablet 1  . magnesium oxide (MAG-OX) 400 MG tablet Take 1 tablet (400 mg total) by mouth  daily. 30 tablet 6  . Multiple Vitamins-Minerals (PRESERVISION AREDS) TABS Take 2 tablets by mouth daily.    . pravastatin (PRAVACHOL) 20 MG tablet Take 20 mg by mouth daily.    . timolol (TIMOPTIC) 0.5 % ophthalmic solution Place 1 drop into the right eye at bedtime.    . traZODone (DESYREL) 50 MG tablet Take 50 mg by mouth 2 (two) times daily.     No current facility-administered medications for this visit.  Should have treatment  No Known Allergies  Review of Systems negative except from HPI and PMH  Physical Exam BP 124/70 (BP Location: Left Arm, Patient Position: Sitting, Cuff Size: Normal)   Pulse 73   Ht 6\' 4"  (1.93 m)   Wt 210 lb (95.3 kg)   SpO2 97%   BMI 25.56 kg/m  Well developed and well nourished in no acute distress HENT normal Neck supple with JVP-flat Clear Device pocket well healed; without hematoma or erythema.  There is no tethering  Irregularly irregular rate and rhythm, no murmur Abd-soft with active BS No Clubbing cyanosis  edema Skin-warm and dry A & Oriented  Grossly normal sensory and motor function  Atrial fibrillation at 73 Interval-/12/41 PVCs-frequent with left bundle indeterminate axis Occasional ventricular pacing     Assessment and  Plan  Syncope.   Atrial fibrillation permanent  Sinus bradycardia/Chronotropic incompetence  Wide QRS tachycardia   Junctional tachycardia  PVCs  LBBB infer axis, late intrinsicoid deflection   Congestive heart failure-chronic diastolic  Nonischemic cardiomyopathy resolved   Pacemaker-Medtronic        Atrial fibrillation is now permanent.  Rate control is a little bit hard to assess because of the recent introduction of diltiazem.  However, symptomatically he feels much better and his heart rate is well controlled on his twelve-lead.  We will refill his diltiazem at 120 mg  Continue his anticoagulation with Eliquis at 5 mg twice daily.  Cardiomyopathy-resolved  Has not tolerated BB in the  past-- will avoid ACE at this time.  Plan echo at next visit esp given the high burden of PVCs which will be difficult to measure in atrial fibrillation and the potential to aggravate a cardiomyopathy

## 2021-04-09 NOTE — Patient Instructions (Signed)

## 2021-06-04 MED ORDER — APIXABAN 5 MG PO TABS: 5.0000 mg | ORAL_TABLET | Freq: Two times a day (BID) | ORAL | 1 refills | Status: DC

## 2021-06-05 ENCOUNTER — Other Ambulatory Visit: Payer: Self-pay | Admitting: *Deleted

## 2021-06-05 MED ORDER — APIXABAN 5 MG PO TABS: 5.0000 mg | ORAL_TABLET | Freq: Two times a day (BID) | ORAL | 1 refills | Status: DC

## 2021-06-05 NOTE — Telephone Encounter (Signed)
Prescription refill request for Eliquis received.  Indication: afib  Last office visit: Caryl Comes, 04/09/2021 Scr: 0.9, 03/05/2021 Age: 80 yo  Weight: 95.3 kg   Pt is on the correct dose of Eliquis per dosing criteria, prescription refill sent for Eliquis 5mg  BID.

## 2021-06-07 ENCOUNTER — Ambulatory Visit (INDEPENDENT_AMBULATORY_CARE_PROVIDER_SITE_OTHER): Payer: Medicare Other

## 2021-06-07 DIAGNOSIS — I495 Sick sinus syndrome: Secondary | ICD-10-CM | POA: Diagnosis not present

## 2021-06-10 LAB — CUP PACEART REMOTE DEVICE CHECK
Battery Remaining Longevity: 35 mo
Battery Voltage: 2.97 V
Brady Statistic RA Percent Paced: 0.03 %
Brady Statistic RV Percent Paced: 45.29 %
Date Time Interrogation Session: 20220722075104
Implantable Lead Implant Date: 20160921
Implantable Lead Implant Date: 20160921
Implantable Lead Location: 753859
Implantable Lead Location: 753860
Implantable Lead Model: 5076
Implantable Lead Model: 5076
Implantable Pulse Generator Implant Date: 20160921
Lead Channel Impedance Value: 285 Ohm
Lead Channel Impedance Value: 285 Ohm
Lead Channel Impedance Value: 342 Ohm
Lead Channel Impedance Value: 437 Ohm
Lead Channel Pacing Threshold Amplitude: 0.375 V
Lead Channel Pacing Threshold Amplitude: 1 V
Lead Channel Pacing Threshold Pulse Width: 0.4 ms
Lead Channel Pacing Threshold Pulse Width: 0.4 ms
Lead Channel Sensing Intrinsic Amplitude: 1 mV
Lead Channel Sensing Intrinsic Amplitude: 1 mV
Lead Channel Sensing Intrinsic Amplitude: 6.75 mV
Lead Channel Sensing Intrinsic Amplitude: 6.75 mV
Lead Channel Setting Pacing Amplitude: 2 V
Lead Channel Setting Pacing Amplitude: 2.5 V
Lead Channel Setting Pacing Pulse Width: 0.4 ms
Lead Channel Setting Sensing Sensitivity: 2.8 mV

## 2021-07-02 NOTE — Progress Notes (Signed)
Remote pacemaker transmission.   

## 2021-10-03 ENCOUNTER — Ambulatory Visit (INDEPENDENT_AMBULATORY_CARE_PROVIDER_SITE_OTHER): Payer: Medicare Other

## 2021-10-03 DIAGNOSIS — I495 Sick sinus syndrome: Secondary | ICD-10-CM | POA: Diagnosis not present

## 2021-10-03 LAB — CUP PACEART REMOTE DEVICE CHECK
Battery Remaining Longevity: 32 mo
Battery Voltage: 2.96 V
Brady Statistic RA Percent Paced: 0.03 %
Brady Statistic RV Percent Paced: 46.64 %
Date Time Interrogation Session: 20221117090732
Implantable Lead Implant Date: 20160921
Implantable Lead Implant Date: 20160921
Implantable Lead Location: 753859
Implantable Lead Location: 753860
Implantable Lead Model: 5076
Implantable Lead Model: 5076
Implantable Pulse Generator Implant Date: 20160921
Lead Channel Impedance Value: 304 Ohm
Lead Channel Impedance Value: 304 Ohm
Lead Channel Impedance Value: 361 Ohm
Lead Channel Impedance Value: 437 Ohm
Lead Channel Pacing Threshold Amplitude: 0.375 V
Lead Channel Pacing Threshold Amplitude: 1 V
Lead Channel Pacing Threshold Pulse Width: 0.4 ms
Lead Channel Pacing Threshold Pulse Width: 0.4 ms
Lead Channel Sensing Intrinsic Amplitude: 0.875 mV
Lead Channel Sensing Intrinsic Amplitude: 0.875 mV
Lead Channel Sensing Intrinsic Amplitude: 7.25 mV
Lead Channel Sensing Intrinsic Amplitude: 7.25 mV
Lead Channel Setting Pacing Amplitude: 2 V
Lead Channel Setting Pacing Amplitude: 2.5 V
Lead Channel Setting Pacing Pulse Width: 0.4 ms
Lead Channel Setting Sensing Sensitivity: 2.8 mV

## 2021-10-14 NOTE — Progress Notes (Signed)
Remote pacemaker transmission.   

## 2021-10-17 ENCOUNTER — Encounter: Payer: Self-pay | Admitting: Internal Medicine

## 2021-10-17 ENCOUNTER — Ambulatory Visit: Payer: Medicare Other | Admitting: Internal Medicine

## 2021-10-17 ENCOUNTER — Other Ambulatory Visit: Payer: Self-pay

## 2021-10-17 VITALS — BP 158/80 | HR 71 | Ht 76.0 in | Wt 204.2 lb

## 2021-10-17 DIAGNOSIS — Z959 Presence of cardiac and vascular implant and graft, unspecified: Secondary | ICD-10-CM | POA: Diagnosis not present

## 2021-10-17 DIAGNOSIS — R001 Bradycardia, unspecified: Secondary | ICD-10-CM

## 2021-10-17 DIAGNOSIS — I4821 Permanent atrial fibrillation: Secondary | ICD-10-CM

## 2021-10-17 DIAGNOSIS — I472 Ventricular tachycardia, unspecified: Secondary | ICD-10-CM

## 2021-10-17 DIAGNOSIS — I493 Ventricular premature depolarization: Secondary | ICD-10-CM

## 2021-10-17 MED ORDER — APIXABAN 5 MG PO TABS: 5.0000 mg | ORAL_TABLET | Freq: Two times a day (BID) | ORAL | 1 refills | Status: DC

## 2021-10-17 NOTE — Patient Instructions (Signed)
Medication Instructions:  Your physician recommends that you continue on your current medications as directed. Please refer to the Current Medication list given to you today.  *If you need a refill on your cardiac medications before your next appointment, please call your pharmacy*   Lab Work: None ordered.  If you have labs (blood work) drawn today and your tests are completely normal, you will receive your results only by: [place] (if you have MyChart) OR A paper copy in the mail If you have any lab test that is abnormal or we need to change your treatment, we will call you to review the results.   Testing/Procedures: Your physician has requested that you have an echocardiogram. Echocardiography is a painless test that uses sound waves to create images of your heart. It provides your doctor with information about the size and shape of your heart and how well your heart's chambers and valves are working. This procedure takes approximately one hour. There are no restrictions for this procedure.    Follow-Up: At [facility], you and your health needs are our priority.  As part of our continuing mission to provide you with exceptional heart care, we have created designated Provider Care Teams.  These Care Teams include your primary Cardiologist (physician) and Advanced Practice Providers (APPs -  Physician Assistants and Nurse Practitioners) who all work together to provide you with the care you need, when you need it.  We recommend signing up for the patient portal called "MyChart".  Sign up information is provided on this After Visit Summary.  MyChart is used to connect with patients for Virtual Visits (Telemedicine).  Patients are able to view lab/test results, encounter notes, upcoming appointments, etc.  Non-urgent messages can be sent to your provider as well.   To learn more about what you can do with MyChart, go to [email or web address].    Your next appointment:   6  months with Dr Caryl Comes

## 2021-10-17 NOTE — Progress Notes (Signed)
Patient Care Team: [name], MD as PCP - General (Internal Medicine) Deboraha Sprang, MD as PCP - Cardiology (Cardiology)   HPI  Grant Moreno. is a 80 y.o. male Seen in followup for syncope with Wide QRS Tachycardia felt to be VT , noninducibility at EPS with LV dysfunction; cath normal CA 2/15; interval  Afib & RVR cx by sinus bradycardia.  A failed antiarrhythmics, atrial fibrillation is now permanent Anitcoagulation with Apixoban  without bleeding   Syncope was associated with profound bradycardia; monitoring demonstrated subsequent control of atrial fibrillation rates>> pacemaker implantation  Medtronic 9/16   He is accompanied by his wife and has been doing well. He has not had a seizure episode. However, he feels he is in constant Afib. He does not check his blood pressure at home. The patient denies chest pain, shortness of breath, nocturnal dyspnea, orthopnea or peripheral edema. There have been no lightheadedness or syncope. Complains of palpitations .Marland Kitchen  Date Cr K Mg Hgb     5/17  0.8 4.3      4/18  0.94 4.4 2.1 13.7   9/18 0.84 4.5 2.3    3/19 0.85 4.5 2.3 14.4   5/19 0.8      11/20 0.8 4.5 2.1    7/21 0.9 4.5  15.0 (8/21)    DATE TEST EF   2/15 Echo   35-40 %   5/16 Echo   55-60 %   10/21 Echo  50-55%   .   Thromboembolic risk factors ( age  -2, CHF-1) for a CHADSVASc Score of >=3   Past Medical History:  Diagnosis Date   Dysrhythmia    PAF, VT   Hx of dysplastic nevus 10/17/2008   Lower back. Slight to moderate atypia.   Persistent atrial fibrillation ([facility])    a. 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 s/p MDT PPM   Syncope    Ventricular tachycardia (paroxysmal)     Past Surgical History:  Procedure Laterality Date   BROW PTOSIS Bilateral 06/01/2018   Procedure: BROW PTOSIS;  Surgeon: Karle Starch, MD;  Location: [place];  Service: Ophthalmology;  Laterality: Bilateral;    ELECTROPHYSIOLOGY STUDY  01-08-14   negative EPS by Dr Lovena Le following syncopal episode and documented WCT   ELECTROPHYSIOLOGY STUDY N/A 01/09/2014   Procedure: ELECTROPHYSIOLOGY STUDY +/- ICD, +/- ILR;  Surgeon: Evans Lance, MD;  Location: [facility] CATH LAB;  Service: Cardiovascular;  Laterality: N/A;   EP IMPLANTABLE DEVICE N/A 08/08/2015   Procedure: Pacemaker Implant;  Surgeon: Deboraha Sprang, MD;  Location: [place] CV LAB;  Service: Cardiovascular;  Laterality: N/A;   EP IMPLANTABLE DEVICE N/A 08/08/2015   Procedure: Loop Recorder Removal;  Surgeon: Deboraha Sprang, MD;  Location: [place] CV LAB;  Service: Cardiovascular;  Laterality: N/A;   FINGER SURGERY     Noted the only time patient has been admitted overnight.     LEFT HEART CATHETERIZATION WITH CORONARY ANGIOGRAM N/A 01/06/2014   Procedure: LEFT HEART CATHETERIZATION WITH CORONARY ANGIOGRAM;  Surgeon: Leonie Man, MD;  Location: [facility] CATH LAB;  Service: Cardiovascular;  Laterality: N/A;   LOOP RECORDER IMPLANT  01-08-14   MDT LinQ implanted by Dr Lovena Le for syncope and WCT   PTOSIS REPAIR Bilateral 06/01/2018   Procedure: BLEPHAROPTOSIS REPAIR RESECT;  Surgeon: Karle Starch, MD;  Location: [place];  Service:  Ophthalmology;  Laterality: Bilateral;    Current Outpatient Medications  Medication Sig Dispense Refill   apixaban (ELIQUIS) 5 MG TABS tablet Take 1 tablet (5 mg total) by mouth 2 (two) times daily. 180 tablet 1   diltiazem (CARDIZEM CD) 120 MG 24 hr capsule Take 1 capsule (120 mg total) by mouth daily. 90 capsule 3   ELDERBERRY PO Take 1 capsule by mouth daily.     Multiple Vitamins-Minerals (PRESERVISION AREDS) TABS Take 2 tablets by mouth daily.     pravastatin (PRAVACHOL) 20 MG tablet Take 20 mg by mouth daily.     timolol (TIMOPTIC) 0.5 % ophthalmic solution Place 1 drop into the right eye at bedtime.     traZODone (DESYREL) 100 MG tablet Take 100 mg by mouth at bedtime. Pt takes 2 tablet every night      mometasone (ELOCON) 0.1 % cream Apply topically as needed.     No current facility-administered medications for this visit.  Should have treatment  No Known Allergies  Review of Systems negative except from HPI and PMH  Physical Exam Vitals:   10/17/21 1419  BP: (!) 158/80  Pulse: 71  SpO2: 99%   Well developed and nourished in no acute distress HENT normal Neck supple with JVP-flat Carotids brisk and full without bruits Clear Device pocket well healed; without hematoma or erythema.  There is no tethering  Irregularly irregular rate and rhythm with controlled ventricular response, no murmurs or gallops Abd-soft with active BS without hepatomegaly No Clubbing cyanosis edema Skin-warm and dry A & Oriented  Grossly normal sensory and motor function   ECG atrial fibrillation with ventricular pacing at 70 and intermittent intrinsic conduction     Assessment and  Plan  Syncope.   Atrial fibrillation permanent  Wide QRS tachycardia   Junctional tachycardia  PVCs  LBBB infer axis, late intrinsicoid deflection   Congestive heart failure-chronic diastolic  Nonischemic cardiomyopathy resolved   Pacemaker-Medtronic     Atrial fibrillation is permanent.  Heart rate is reasonably controlled and in fact better controlled and has been, concurrent with this has been a modest increase in ventricular pacing percentage.  He continues to feel great without symptomatic limitations.  We will continue diltiazem at 120.  No bleeding on his anticoagulation.  We will continue at 5 mg twice daily.  Some dysgeusia.  Wonder if it is medication related, he will undertake a sequential 2-week exclusion trial of both diltiazem and his pravastatin and let us know.     I,Mykaella Javier,acting as a scribe for Virl Axe, MD.,have documented all relevant documentation on the behalf of Virl Axe, MD,as directed by  Virl Axe, MD while in the presence of Virl Axe, MD.  I, Virl Axe, MD, have reviewed all documentation for this visit. The documentation on 10/17/21 for the exam, diagnosis, procedures, and orders are all accurate and complete.

## 2021-11-05 ENCOUNTER — Ambulatory Visit: Payer: Medicare Other | Admitting: Dermatology

## 2021-11-13 ENCOUNTER — Ambulatory Visit: Payer: Medicare Other | Admitting: Dermatology

## 2021-11-13 ENCOUNTER — Other Ambulatory Visit: Payer: Self-pay

## 2021-11-13 DIAGNOSIS — Z1283 Encounter for screening for malignant neoplasm of skin: Secondary | ICD-10-CM

## 2021-11-13 DIAGNOSIS — L814 Other melanin hyperpigmentation: Secondary | ICD-10-CM

## 2021-11-13 DIAGNOSIS — L570 Actinic keratosis: Secondary | ICD-10-CM | POA: Diagnosis not present

## 2021-11-13 DIAGNOSIS — L578 Other skin changes due to chronic exposure to nonionizing radiation: Secondary | ICD-10-CM

## 2021-11-13 DIAGNOSIS — L821 Other seborrheic keratosis: Secondary | ICD-10-CM

## 2021-11-13 DIAGNOSIS — L820 Inflamed seborrheic keratosis: Secondary | ICD-10-CM | POA: Diagnosis not present

## 2021-11-13 DIAGNOSIS — D229 Melanocytic nevi, unspecified: Secondary | ICD-10-CM

## 2021-11-13 DIAGNOSIS — D692 Other nonthrombocytopenic purpura: Secondary | ICD-10-CM

## 2021-11-13 DIAGNOSIS — L300 Nummular dermatitis: Secondary | ICD-10-CM | POA: Diagnosis not present

## 2021-11-13 DIAGNOSIS — D1801 Hemangioma of skin and subcutaneous tissue: Secondary | ICD-10-CM

## 2021-11-13 DIAGNOSIS — L720 Epidermal cyst: Secondary | ICD-10-CM

## 2021-11-13 DIAGNOSIS — Z872 Personal history of diseases of the skin and subcutaneous tissue: Secondary | ICD-10-CM

## 2021-11-13 DIAGNOSIS — Z86018 Personal history of other benign neoplasm: Secondary | ICD-10-CM

## 2021-11-13 MED ORDER — TRIAMCINOLONE ACETONIDE 0.1 % EX CREA: 1.0000 "application " | TOPICAL_CREAM | Freq: Every day | CUTANEOUS | 1 refills | Status: DC | PRN

## 2021-11-13 NOTE — Patient Instructions (Addendum)

## 2021-11-13 NOTE — Progress Notes (Signed)
Follow-Up Visit   Subjective  Grant Moreno. is a 80 y.o. male who presents for the following: Annual Exam (History of dysplastic nevus - TBSE today). H/o Aks.  The patient has spots, moles and lesions to be evaluated, some may be new or changing and the patient has concerns that these could be cancer.   Accompanied by wife  The following portions of the chart were reviewed this encounter and updated as appropriate:       Review of Systems:  No other skin or systemic complaints except as noted in HPI or Assessment and Plan.  Objective  Well appearing patient in no apparent distress; mood and affect are within normal limits.  All skin waist up examined.  Arms Violaceous patches  Nasal dorsum x 2, left temple x 2, right temple x 2, right ear helix x 1, forehead x 3, scalp x 2, left upper arm x 5, left forearm x 2, left wrist x 2, right forearm x 3 (24) Erythematous thin papules/macules with gritty scale.   Right Elbow - Posterior Pink scaly patch  Spinal mid upper back 2.5 cm Subcutaneous nodule of spinal mid upper back.  Subcutaneous nodule of right lat mid back  Right lat post shoulder x 1, right chest x 1 (2) Erythematous stuck-on, waxy papule    Assessment & Plan   Lentigines - Scattered tan macules - Due to sun exposure - Benign-appearing, observe - Recommend daily broad spectrum sunscreen SPF 30+ to sun-exposed areas, reapply every 2 hours as needed. - Call for any changes  Seborrheic Keratoses - Stuck-on, waxy, tan-brown papules and/or plaques  - Benign-appearing - Discussed benign etiology and prognosis. - Observe - Call for any changes  Melanocytic Nevi - Tan-brown and/or pink-flesh-colored symmetric macules and papules - Benign appearing on exam today - Observation - Call clinic for new or changing moles - Recommend daily use of broad spectrum spf 30+ sunscreen to sun-exposed areas.   Hemangiomas - Red papules - Discussed benign nature -  Observe - Call for any changes  Actinic Damage - Chronic condition, secondary to cumulative UV/sun exposure - diffuse scaly erythematous macules with underlying dyspigmentation - Recommend daily broad spectrum sunscreen SPF 30+ to sun-exposed areas, reapply every 2 hours as needed.  - Staying in the shade or wearing long sleeves, sun glasses (UVA+UVB protection) and wide brim hats (4-inch brim around the entire circumference of the hat) are also recommended for sun protection.  - Call for new or changing lesions.  Skin cancer screening performed today.  Purpura ([place]) Arms  Purpura - Chronic; persistent and recurrent.  Treatable, but not curable. - Violaceous macules and patches - Benign - Related to trauma, age, sun damage and/or use of blood thinners, chronic use of topical and/or oral steroids - Observe - Can use OTC arnica containing moisturizer such as Dermend Bruise Formula if desired - Call for worsening or other concerns   AK (actinic keratosis) (24) Nasal dorsum x 2, left temple x 2, right temple x 2, right ear helix x 1, forehead x 3, scalp x 2, left upper arm x 5, left forearm x 2, left wrist x 2, right forearm x 3  Actinic keratoses are precancerous spots that appear secondary to cumulative UV radiation exposure/sun exposure over time. They are chronic with expected duration over 1 year. A portion of actinic keratoses will progress to squamous cell carcinoma of the skin. It is not possible to reliably predict which spots will progress to skin cancer  and so treatment is recommended to prevent development of skin cancer.  Recommend daily broad spectrum sunscreen SPF 30+ to sun-exposed areas, reapply every 2 hours as needed.  Recommend staying in the shade or wearing long sleeves, sun glasses (UVA+UVB protection) and wide brim hats (4-inch brim around the entire circumference of the hat). Call for new or changing lesions.   Destruction of lesion - Nasal dorsum x 2, left temple  x 2, right temple x 2, right ear helix x 1, forehead x 3, scalp x 2, left upper arm x 5, left forearm x 2, left wrist x 2, right forearm x 3  Destruction method: cryotherapy   Informed consent: discussed and consent obtained   Timeout:  patient name, date of birth, surgical site, and procedure verified Lesion destroyed using liquid nitrogen: Yes   Region frozen until ice ball extended beyond lesion: Yes   Outcome: patient tolerated procedure well with no complications   Post-procedure details: wound care instructions given   Additional details:  Prior to procedure, discussed risks of blister formation, small wound, skin dyspigmentation, or rare scar following cryotherapy. Recommend Vaseline ointment to treated areas while healing.   Nummular dermatitis Right Elbow - Posterior  Start TMC 0.1% cream qd-bid until rash cleared  Recommend mild soap and moisturizing cream 1-2 times daily.  Gentle skin care handout provided.    triamcinolone cream (KENALOG) 0.1 % - Right Elbow - Posterior Apply 1 application topically daily as needed. Avoid face, groin, underarms  Epidermal inclusion cyst Spinal mid upper back  Benign-appearing. Exam most consistent with an epidermal inclusion cyst. Discussed that a cyst is a benign growth that can grow over time and sometimes get irritated or inflamed. Recommend observation if it is not bothersome. Discussed option of surgical excision to remove it if it is growing, symptomatic, or other changes noted. Please call for new or changing lesions so they can be evaluated.    Inflamed seborrheic keratosis (2) Right lat post shoulder x 1, right chest x 1  Destruction of lesion - Right lat post shoulder x 1, right chest x 1  Destruction method: cryotherapy   Informed consent: discussed and consent obtained   Timeout:  patient name, date of birth, surgical site, and procedure verified Lesion destroyed using liquid nitrogen: Yes   Region frozen until ice ball  extended beyond lesion: Yes   Outcome: patient tolerated procedure well with no complications   Post-procedure details: wound care instructions given   Additional details:  Prior to procedure, discussed risks of blister formation, small wound, skin dyspigmentation, or rare scar following cryotherapy. Recommend Vaseline ointment to treated areas while healing.    Return in about 1 year (around 11/13/2022) for AKs, h/o dysplastic nevus.  I, Ashok Cordia, CMA, am acting as scribe for Brendolyn Patty, MD .  Documentation: I have reviewed the above documentation for accuracy and completeness, and I agree with the above.  Brendolyn Patty MD

## 2021-11-15 LAB — COLOGUARD: COLOGUARD: NEGATIVE

## 2021-11-27 ENCOUNTER — Telehealth: Payer: Self-pay

## 2021-11-27 NOTE — Telephone Encounter (Signed)
Patient called in stating he was told last week you were suppose to call in a 90 day supply for eliquis, a years worth to the [facility] on file. He would like a call back when this is sent just as confirmation

## 2021-11-27 NOTE — Telephone Encounter (Signed)
Attempted phone call to pt and left voicemail message to advise:   Eliquis refill was sent on 10/18/2021 for 90 days with 1 refill.  Medication is refilled for 6 months at a time due to needing blood work every 6 months to continue medication.  Pt advised to contact [phone] for further questions.

## 2021-12-31 ENCOUNTER — Encounter: Payer: Self-pay | Admitting: Internal Medicine

## 2022-01-01 MED ORDER — DILTIAZEM HCL ER COATED BEADS 120 MG PO CP24: 120.0000 mg | ORAL_CAPSULE | Freq: Every day | ORAL | 3 refills | Status: DC

## 2022-01-02 ENCOUNTER — Ambulatory Visit (INDEPENDENT_AMBULATORY_CARE_PROVIDER_SITE_OTHER): Payer: Medicare Other

## 2022-01-02 DIAGNOSIS — I495 Sick sinus syndrome: Secondary | ICD-10-CM | POA: Diagnosis not present

## 2022-01-02 LAB — CUP PACEART REMOTE DEVICE CHECK
Battery Remaining Longevity: 30 mo
Battery Voltage: 2.96 V
Brady Statistic RA Percent Paced: 0.03 %
Brady Statistic RV Percent Paced: 49.55 %
Date Time Interrogation Session: 20230216095046
Implantable Lead Implant Date: 20160921
Implantable Lead Implant Date: 20160921
Implantable Lead Location: 753859
Implantable Lead Location: 753860
Implantable Lead Model: 5076
Implantable Lead Model: 5076
Implantable Pulse Generator Implant Date: 20160921
Lead Channel Impedance Value: 285 Ohm
Lead Channel Impedance Value: 285 Ohm
Lead Channel Impedance Value: 342 Ohm
Lead Channel Impedance Value: 418 Ohm
Lead Channel Pacing Threshold Amplitude: 0.375 V
Lead Channel Pacing Threshold Amplitude: 1.25 V
Lead Channel Pacing Threshold Pulse Width: 0.4 ms
Lead Channel Pacing Threshold Pulse Width: 0.4 ms
Lead Channel Sensing Intrinsic Amplitude: 0.5 mV
Lead Channel Sensing Intrinsic Amplitude: 0.5 mV
Lead Channel Sensing Intrinsic Amplitude: 6.375 mV
Lead Channel Sensing Intrinsic Amplitude: 6.375 mV
Lead Channel Setting Pacing Amplitude: 2 V
Lead Channel Setting Pacing Amplitude: 2.5 V
Lead Channel Setting Pacing Pulse Width: 0.4 ms
Lead Channel Setting Sensing Sensitivity: 2.8 mV

## 2022-01-07 NOTE — Progress Notes (Signed)
Remote pacemaker transmission.   

## 2022-04-03 ENCOUNTER — Ambulatory Visit (INDEPENDENT_AMBULATORY_CARE_PROVIDER_SITE_OTHER): Payer: Medicare Other

## 2022-04-03 DIAGNOSIS — I495 Sick sinus syndrome: Secondary | ICD-10-CM

## 2022-04-04 LAB — CUP PACEART REMOTE DEVICE CHECK
Battery Remaining Longevity: 28 mo
Battery Voltage: 2.95 V
Brady Statistic RA Percent Paced: 0.04 %
Brady Statistic RV Percent Paced: 55.49 %
Date Time Interrogation Session: 20230518075141
Implantable Lead Implant Date: 20160921
Implantable Lead Implant Date: 20160921
Implantable Lead Location: 753859
Implantable Lead Location: 753860
Implantable Lead Model: 5076
Implantable Lead Model: 5076
Implantable Pulse Generator Implant Date: 20160921
Lead Channel Impedance Value: 304 Ohm
Lead Channel Impedance Value: 304 Ohm
Lead Channel Impedance Value: 342 Ohm
Lead Channel Impedance Value: 380 Ohm
Lead Channel Pacing Threshold Amplitude: 0.375 V
Lead Channel Pacing Threshold Amplitude: 1.25 V
Lead Channel Pacing Threshold Pulse Width: 0.4 ms
Lead Channel Pacing Threshold Pulse Width: 0.4 ms
Lead Channel Sensing Intrinsic Amplitude: 0.5 mV
Lead Channel Sensing Intrinsic Amplitude: 0.5 mV
Lead Channel Sensing Intrinsic Amplitude: 6.125 mV
Lead Channel Sensing Intrinsic Amplitude: 6.125 mV
Lead Channel Setting Pacing Amplitude: 2 V
Lead Channel Setting Pacing Amplitude: 2.5 V
Lead Channel Setting Pacing Pulse Width: 0.4 ms
Lead Channel Setting Sensing Sensitivity: 2.8 mV

## 2022-04-11 NOTE — Progress Notes (Signed)
Remote pacemaker transmission.   

## 2022-04-16 ENCOUNTER — Ambulatory Visit (HOSPITAL_COMMUNITY): Payer: Medicare Other | Attending: Cardiology

## 2022-04-16 DIAGNOSIS — I472 Ventricular tachycardia, unspecified: Secondary | ICD-10-CM | POA: Diagnosis present

## 2022-04-16 DIAGNOSIS — I4821 Permanent atrial fibrillation: Secondary | ICD-10-CM | POA: Diagnosis present

## 2022-04-16 DIAGNOSIS — Z959 Presence of cardiac and vascular implant and graft, unspecified: Secondary | ICD-10-CM | POA: Insufficient documentation

## 2022-04-16 DIAGNOSIS — I493 Ventricular premature depolarization: Secondary | ICD-10-CM

## 2022-04-16 DIAGNOSIS — R001 Bradycardia, unspecified: Secondary | ICD-10-CM | POA: Insufficient documentation

## 2022-04-16 LAB — ECHOCARDIOGRAM COMPLETE
Area-P 1/2: 2.61 cm2
MV M vel: 4.23 m/s
MV Peak grad: 71.6 mmHg
P 1/2 time: 762 msec
Radius: 0.7 cm
S' Lateral: 4.5 cm

## 2022-04-18 DIAGNOSIS — R000 Tachycardia, unspecified: Secondary | ICD-10-CM | POA: Insufficient documentation

## 2022-04-23 ENCOUNTER — Telehealth: Payer: Self-pay

## 2022-04-23 ENCOUNTER — Ambulatory Visit: Payer: Medicare Other | Admitting: Internal Medicine

## 2022-04-23 ENCOUNTER — Encounter: Payer: Self-pay | Admitting: Internal Medicine

## 2022-04-23 VITALS — BP 116/74 | HR 74 | Ht 76.0 in | Wt 200.2 lb

## 2022-04-23 DIAGNOSIS — I5022 Chronic systolic (congestive) heart failure: Secondary | ICD-10-CM

## 2022-04-23 DIAGNOSIS — I4729 Other ventricular tachycardia: Secondary | ICD-10-CM | POA: Diagnosis not present

## 2022-04-23 DIAGNOSIS — I493 Ventricular premature depolarization: Secondary | ICD-10-CM

## 2022-04-23 DIAGNOSIS — Z959 Presence of cardiac and vascular implant and graft, unspecified: Secondary | ICD-10-CM

## 2022-04-23 DIAGNOSIS — I4821 Permanent atrial fibrillation: Secondary | ICD-10-CM

## 2022-04-23 DIAGNOSIS — R000 Tachycardia, unspecified: Secondary | ICD-10-CM

## 2022-04-23 DIAGNOSIS — I429 Cardiomyopathy, unspecified: Secondary | ICD-10-CM

## 2022-04-23 DIAGNOSIS — Z79899 Other long term (current) drug therapy: Secondary | ICD-10-CM

## 2022-04-23 MED ORDER — DIGOXIN 250 MCG PO TABS: 0.2500 mg | ORAL_TABLET | Freq: Every day | ORAL | 3 refills | Status: DC

## 2022-04-23 MED ORDER — APIXABAN 5 MG PO TABS: 5.0000 mg | ORAL_TABLET | Freq: Two times a day (BID) | ORAL | 1 refills | Status: DC

## 2022-04-23 MED ORDER — SPIRONOLACTONE 25 MG PO TABS: 12.5000 mg | ORAL_TABLET | Freq: Every evening | ORAL | 3 refills | Status: DC

## 2022-04-23 NOTE — Telephone Encounter (Signed)
**Note De-Identified Neil Errickson Obfuscation** Digoxin PA started through covermymeds. Key: [id number]

## 2022-04-23 NOTE — Telephone Encounter (Signed)
**Note De-Identified Shimon Trowbridge Obfuscation** Valrie Hart jr Key: [id number] - PA Case ID: [id number] - Rx #: [id number] Outcome: Approved today DIGOXIN TAB 0.'25MG'$  is approved through 11/16/2022. Your patient may now fill this prescription and it will be covered. Drug: Digoxin 250MCG tablets Form: [employer] Medicare Part D Electronic Prior Authorization Form (2017 NCPDP  I have notified [id number] Lorina Rabon, [place] (Ph: [phone]) of this approval.

## 2022-04-23 NOTE — Progress Notes (Signed)
Patient Care Team: [name], MD as PCP - General (Internal Medicine) Deboraha Sprang, MD as PCP - Cardiology (Cardiology)   HPI  Grant Moreno. is a 81 y.o. male Seen in followup for syncope with Wide QRS Tachycardia felt to be VT , noninducibility at EPS with LV dysfunction; cath normal CA 2/15; interval  Afib & RVR cx by sinus bradycardia.  A failed antiarrhythmics, atrial fibrillation is now permanent Anitcoagulation with Apixoban no bleeding ma'am  Syncope was associated with profound bradycardia; monitoring demonstrated subsequent control of atrial fibrillation rates>> pacemaker implantation  Medtronic 9/16   He has no significant complaints.  Able to mow his yard.  No edema.  No chest pain.  Reminds me that has had significant lightheadedness with beta-blockers in the past.  Date Cr K Mg Hgb     5/17  0.8 4.3      4/18  0.94 4.4 2.1 13.7   9/18 0.84 4.5 2.3    3/19 0.85 4.5 2.3 14.4   5/19 0.8      11/20 0.8 4.5 2.1    7/21 0.9 4.5  15.0 (8/21)   5/23 0.9 4.4      DATE TEST EF   2/15 Echo   35-40 %   5/16 Echo   55-60 %   10/21 Echo  50-55%   5/23 Echo  30-35%   .   Thromboembolic risk factors ( age  -2, CHF-1) for a CHADSVASc Score of >=3   Past Medical History:  Diagnosis Date   Dysrhythmia    PAF, VT   Hx of dysplastic nevus 10/17/2008   Lower back. Slight to moderate atypia.   Persistent atrial fibrillation (HCC)    a. 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 s/p MDT PPM   Syncope    Ventricular tachycardia (paroxysmal)     Past Surgical History:  Procedure Laterality Date   BROW PTOSIS Bilateral 06/01/2018   Procedure: BROW PTOSIS;  Surgeon: Karle Starch, MD;  Location: [place];  Service: Ophthalmology;  Laterality: Bilateral;   ELECTROPHYSIOLOGY STUDY  01-08-14   negative EPS by Dr Lovena Le following syncopal episode and documented WCT   ELECTROPHYSIOLOGY STUDY N/A  01/09/2014   Procedure: ELECTROPHYSIOLOGY STUDY +/- ICD, +/- ILR;  Surgeon: Evans Lance, MD;  Location: [facility] CATH LAB;  Service: Cardiovascular;  Laterality: N/A;   EP IMPLANTABLE DEVICE N/A 08/08/2015   Procedure: Pacemaker Implant;  Surgeon: Deboraha Sprang, MD;  Location: [place] CV LAB;  Service: Cardiovascular;  Laterality: N/A;   EP IMPLANTABLE DEVICE N/A 08/08/2015   Procedure: Loop Recorder Removal;  Surgeon: Deboraha Sprang, MD;  Location: [place] CV LAB;  Service: Cardiovascular;  Laterality: N/A;   FINGER SURGERY     Noted the only time patient has been admitted overnight.     LEFT HEART CATHETERIZATION WITH CORONARY ANGIOGRAM N/A 01/06/2014   Procedure: LEFT HEART CATHETERIZATION WITH CORONARY ANGIOGRAM;  Surgeon: Leonie Man, MD;  [facility] C[facility] LAB;  Service: Cardiovascular;  Laterality: N/A;   LOOP RECORDER IMPLANT  01-08-14   MDT LinQ implanted by Dr Lovena Le for syncope and WCT   PTOSIS REPAIR Bilateral 06/01/2018   Procedure: BLEPHAROPTOSIS REPAIR RESECT;  Surgeon: Karle Starch, MD;  Location: [place];  Service: Ophthalmology;  Laterality: Bilateral;    Current Outpatient Medications  Medication Sig Dispense Refill  apixaban (ELIQUIS) 5 MG TABS tablet Take 1 tablet (5 mg total) by mouth 2 (two) times daily. 180 tablet 1   diltiazem (CARDIZEM CD) 120 MG 24 hr capsule Take 1 capsule (120 mg total) by mouth daily. 90 capsule 3   ELDERBERRY PO Take 1 capsule by mouth daily.     mometasone (ELOCON) 0.1 % cream Apply topically as needed.     Multiple Vitamins-Minerals (PRESERVISION AREDS) TABS Take 2 tablets by mouth daily.     pravastatin (PRAVACHOL) 20 MG tablet Take 20 mg by mouth daily.     timolol (TIMOPTIC) 0.5 % ophthalmic solution Place 1 drop into the right eye at bedtime.     traZODone (DESYREL) 100 MG tablet Take 100 mg by mouth at bedtime. Pt takes 2 tablet every night     triamcinolone cream (KENALOG) 0.1 % Apply 1 application topically daily as  needed. Avoid face, groin, underarms (Patient not taking: Reported on 04/23/2022) 80 g 1   No current facility-administered medications for this visit.  Should have treatment  No Known Allergies  Review of Systems negative except from HPI and PMH  Physical Exam Vitals:   04/23/22 1011  BP: 116/74  Pulse: 74  SpO2: 97%  Well developed and well nourished in no acute distress HENT normal Neck supple with JVP-flat Clear Device pocket well healed; without hematoma or erythema.  There is no tethering  Irregular rate and rhythm and rhythm, no murmur Abd-soft with active BS No Clubbing cyanosis trace edema Skin-warm and dry A & Oriented  Grossly normal sensory and motor function  ECG    Assessment and  Plan  Syncope.   Atrial fibrillation permanent  Wide QRS tachycardia   Junctional tachycardia  PVCs  LBBB infer axis, late intrinsicoid deflection   Congestive heart failure-chronic diastolic  Nonischemic cardiomyopathy resolved now recurrent  Pacemaker-Medtronic     Atrial fibrillation is permanent.  Cardiomyopathy is new. Heart rate may be excessive.  Unfortunately, he has not tolerated beta-blockers, so we will continue him on his diltiazem and add digoxin 0.25 mg daily starting in 2 weeks.  Prior to that, given the shorter half-life, we will start him on spironolactone at 12.5 mg to take at night.  He will get a metabolic profile in about 2 weeks time prior to the initiation of his digoxin. His diuretic effect may be beneficial also with his mild edema.  It might also be related to ventricular pacing which is at 50%.  We will decrease the lower rate limit from 60--40.  We will plan reassessment of LV function in about 3 or 4 months.

## 2022-04-23 NOTE — Patient Instructions (Addendum)
Medication Instructions:  Your physician has recommended you make the following change in your medication:   Begin Spironolactone '25mg'$  - 1/2 tablet by mouth nightly.  IN 2 WEEKS begin Digoxin 0.'25mg'$  - 1 tablet by mouth daily.  *If you need a refill on your cardiac medications before your next appointment, please call your pharmacy*   Lab Work:  BMET in 2 weeks  Digoxin level in 4 weeks  If you have labs (blood work) drawn today and your tests are completely normal, you will receive your results only by: [place] (if you have MyChart) OR A paper copy in the mail If you have any lab test that is abnormal or we need to change your treatment, we will call you to review the results.   Testing/Procedures: Your physician has requested that you have an echocardiogram in 3 months.   Echocardiography is a painless test that uses sound waves to create images of your heart. It provides your doctor with information about the size and shape of your heart and how well your heart's chambers and valves are working. This procedure takes approximately one hour. There are no restrictions for this procedure.    Follow-Up: At [facility], you and your health needs are our priority.  As part of our continuing mission to provide you with exceptional heart care, we have created designated Provider Care Teams.  These Care Teams include your primary Cardiologist (physician) and Advanced Practice Providers (APPs -  Physician Assistants and Nurse Practitioners) who all work together to provide you with the care you need, when you need it.  We recommend signing up for the patient portal called "MyChart".  Sign up information is provided on this After Visit Summary.  MyChart is used to connect with patients for Virtual Visits (Telemedicine).  Patients are able to view lab/test results, encounter notes, upcoming appointments, etc.  Non-urgent messages can be sent to your provider as well.   To learn more  about what you can do with MyChart, go to [email or web address].    Your next appointment:   6 months with Dr Caryl Comes  Important Information About Sugar

## 2022-05-07 ENCOUNTER — Other Ambulatory Visit: Payer: Medicare Other

## 2022-05-07 DIAGNOSIS — I4821 Permanent atrial fibrillation: Secondary | ICD-10-CM

## 2022-05-07 DIAGNOSIS — I4729 Other ventricular tachycardia: Secondary | ICD-10-CM

## 2022-05-07 DIAGNOSIS — R000 Tachycardia, unspecified: Secondary | ICD-10-CM

## 2022-05-07 DIAGNOSIS — Z959 Presence of cardiac and vascular implant and graft, unspecified: Secondary | ICD-10-CM

## 2022-05-07 DIAGNOSIS — I493 Ventricular premature depolarization: Secondary | ICD-10-CM

## 2022-05-07 DIAGNOSIS — Z79899 Other long term (current) drug therapy: Secondary | ICD-10-CM

## 2022-05-07 DIAGNOSIS — I5022 Chronic systolic (congestive) heart failure: Secondary | ICD-10-CM

## 2022-05-07 LAB — BASIC METABOLIC PANEL
BUN/Creatinine Ratio: 19 (ref 10–24)
BUN: 18 mg/dL (ref 8–27)
CO2: 23 mmol/L (ref 20–29)
Calcium: 9.3 mg/dL (ref 8.6–10.2)
Chloride: 101 mmol/L (ref 96–106)
Creatinine, Ser: 0.97 mg/dL (ref 0.76–1.27)
Glucose: 141 mg/dL — ABNORMAL HIGH (ref 70–99)
Potassium: 4.8 mmol/L (ref 3.5–5.2)
Sodium: 137 mmol/L (ref 134–144)
eGFR: 78 mL/min/{1.73_m2} (ref >59)

## 2022-05-21 ENCOUNTER — Encounter: Payer: Self-pay | Admitting: Internal Medicine

## 2022-05-22 NOTE — Telephone Encounter (Signed)
Spoke with pt who reports he has had profound fatigue since starting Spironolactone and Digoxin.  Pt has taken medications as prescribed.  He states he can feel his heart pounding at times walking across the room or at times just sitting.   RN spoke with Dr Caryl Comes who advises pt to stop both medications as above.  RN also requested that pt send a device transmission for review by our device RN and he will be notified re: findings. Pt verbalizes understanding and agrees with current plan.

## 2022-05-23 ENCOUNTER — Encounter: Payer: Self-pay | Admitting: Internal Medicine

## 2022-06-04 ENCOUNTER — Other Ambulatory Visit: Payer: Medicare Other

## 2022-07-03 ENCOUNTER — Ambulatory Visit (INDEPENDENT_AMBULATORY_CARE_PROVIDER_SITE_OTHER): Payer: Medicare Other

## 2022-07-03 DIAGNOSIS — I429 Cardiomyopathy, unspecified: Secondary | ICD-10-CM

## 2022-07-04 LAB — CUP PACEART REMOTE DEVICE CHECK
Battery Remaining Longevity: 27 mo
Battery Voltage: 2.95 V
Brady Statistic RA Percent Paced: 0 %
Brady Statistic RV Percent Paced: 6.22 %
Date Time Interrogation Session: 20230817080503
Implantable Lead Implant Date: 20160921
Implantable Lead Implant Date: 20160921
Implantable Lead Location: 753859
Implantable Lead Location: 753860
Implantable Lead Model: 5076
Implantable Lead Model: 5076
Implantable Pulse Generator Implant Date: 20160921
Lead Channel Impedance Value: 304 Ohm
Lead Channel Impedance Value: 342 Ohm
Lead Channel Impedance Value: 361 Ohm
Lead Channel Impedance Value: 399 Ohm
Lead Channel Pacing Threshold Amplitude: 0.375 V
Lead Channel Pacing Threshold Amplitude: 1 V
Lead Channel Pacing Threshold Pulse Width: 0.4 ms
Lead Channel Pacing Threshold Pulse Width: 0.4 ms
Lead Channel Sensing Intrinsic Amplitude: 0.625 mV
Lead Channel Sensing Intrinsic Amplitude: 0.625 mV
Lead Channel Sensing Intrinsic Amplitude: 7.25 mV
Lead Channel Sensing Intrinsic Amplitude: 7.25 mV
Lead Channel Setting Pacing Amplitude: 2.5 V
Lead Channel Setting Pacing Pulse Width: 0.4 ms
Lead Channel Setting Sensing Sensitivity: 2.8 mV

## 2022-07-24 ENCOUNTER — Ambulatory Visit (HOSPITAL_COMMUNITY): Payer: Medicare Other | Attending: Internal Medicine

## 2022-07-24 DIAGNOSIS — I429 Cardiomyopathy, unspecified: Secondary | ICD-10-CM | POA: Diagnosis present

## 2022-07-24 DIAGNOSIS — R55 Syncope and collapse: Secondary | ICD-10-CM | POA: Diagnosis not present

## 2022-07-24 DIAGNOSIS — I428 Other cardiomyopathies: Secondary | ICD-10-CM

## 2022-07-24 DIAGNOSIS — I4891 Unspecified atrial fibrillation: Secondary | ICD-10-CM | POA: Insufficient documentation

## 2022-07-24 DIAGNOSIS — I083 Combined rheumatic disorders of mitral, aortic and tricuspid valves: Secondary | ICD-10-CM | POA: Insufficient documentation

## 2022-07-24 DIAGNOSIS — R001 Bradycardia, unspecified: Secondary | ICD-10-CM | POA: Diagnosis not present

## 2022-07-24 DIAGNOSIS — Z950 Presence of cardiac pacemaker: Secondary | ICD-10-CM | POA: Diagnosis not present

## 2022-07-24 LAB — ECHOCARDIOGRAM COMPLETE
P 1/2 time: 820 msec
S' Lateral: 4.3 cm

## 2022-07-25 ENCOUNTER — Encounter: Payer: Self-pay | Admitting: Internal Medicine

## 2022-07-31 NOTE — Progress Notes (Signed)
Remote pacemaker transmission.   

## 2022-08-12 ENCOUNTER — Telehealth: Payer: Self-pay

## 2022-08-12 NOTE — Telephone Encounter (Signed)
-----   Message from Deboraha Sprang, MD sent at 08/08/2022  4:28 PM EDT ----- Please Inform Patient Echo showed worsening heart muscle function despite significant improvement in LESS ventricular pacing now less than 10%    Can we have him come in for 12 lead ECG  and also can you ask him if he would be willing to try losartan again 25 mg hs Thanks SK

## 2022-08-12 NOTE — Telephone Encounter (Signed)
Attempted phone call to pt and left voicemail message to contact RN at [phone]. 

## 2022-08-13 MED ORDER — LOSARTAN POTASSIUM 25 MG PO TABS: 25.0000 mg | ORAL_TABLET | Freq: Every day | ORAL | 3 refills | Status: DC

## 2022-08-13 NOTE — Telephone Encounter (Signed)
Pt returning nurses call. Pease advise

## 2022-08-13 NOTE — Telephone Encounter (Signed)
Spoke with pt and advised of echo results per Dr Caryl Comes.  Appointment scheduled with Dr Caryl Comes on 09/10/2022 at 230pm for EKG review.  Pt states he is willing to take Losartan '25mg'$  - 1 tablet by mouth daily at bedtime but will not start until he returns from 2 week vacation.  Rx sent to pharmacy requested.  Pt verbalizes understanding and is agreeable with current plan.

## 2022-09-09 DIAGNOSIS — I428 Other cardiomyopathies: Secondary | ICD-10-CM | POA: Insufficient documentation

## 2022-09-09 DIAGNOSIS — I472 Ventricular tachycardia, unspecified: Secondary | ICD-10-CM | POA: Insufficient documentation

## 2022-09-09 DIAGNOSIS — I5032 Chronic diastolic (congestive) heart failure: Secondary | ICD-10-CM | POA: Insufficient documentation

## 2022-09-09 DIAGNOSIS — R55 Syncope and collapse: Secondary | ICD-10-CM | POA: Insufficient documentation

## 2022-09-10 ENCOUNTER — Ambulatory Visit: Payer: Medicare Other | Attending: Internal Medicine | Admitting: Internal Medicine

## 2022-09-10 ENCOUNTER — Encounter: Payer: Self-pay | Admitting: Internal Medicine

## 2022-09-10 VITALS — BP 100/62 | HR 60 | Ht 76.0 in | Wt 204.2 lb

## 2022-09-10 DIAGNOSIS — I4821 Permanent atrial fibrillation: Secondary | ICD-10-CM | POA: Diagnosis not present

## 2022-09-10 DIAGNOSIS — R000 Tachycardia, unspecified: Secondary | ICD-10-CM | POA: Diagnosis not present

## 2022-09-10 DIAGNOSIS — I428 Other cardiomyopathies: Secondary | ICD-10-CM

## 2022-09-10 DIAGNOSIS — I493 Ventricular premature depolarization: Secondary | ICD-10-CM

## 2022-09-10 DIAGNOSIS — I472 Ventricular tachycardia, unspecified: Secondary | ICD-10-CM

## 2022-09-10 DIAGNOSIS — Z959 Presence of cardiac and vascular implant and graft, unspecified: Secondary | ICD-10-CM

## 2022-09-10 DIAGNOSIS — R55 Syncope and collapse: Secondary | ICD-10-CM

## 2022-09-10 DIAGNOSIS — I5032 Chronic diastolic (congestive) heart failure: Secondary | ICD-10-CM

## 2022-09-10 NOTE — Patient Instructions (Signed)
Medication Instructions:  Your physician recommends that you continue on your current medications as directed. Please refer to the Current Medication list given to you today.  *If you need a refill on your cardiac medications before your next appointment, please call your pharmacy*   Lab Work: None ordered.  If you have labs (blood work) drawn today and your tests are completely normal, you will receive your results only by: MyChart Message (if you have MyChart) OR A paper copy in the mail If you have any lab test that is abnormal or we need to change your treatment, we will call you to review the results.   Testing/Procedures: None ordered.    Follow-Up: At [facility], you and your health needs are our priority.  As part of our continuing mission to provide you with exceptional heart care, we have created designated Provider Care Teams.  These Care Teams include your primary Cardiologist (physician) and Advanced Practice Providers (APPs -  Physician Assistants and Nurse Practitioners) who all work together to provide you with the care you need, when you need it.  We recommend signing up for the patient portal called "MyChart".  Sign up information is provided on this After Visit Summary.  MyChart is used to connect with patients for Virtual Visits (Telemedicine).  Patients are able to view lab/test results, encounter notes, upcoming appointments, etc.  Non-urgent messages can be sent to your provider as well.   To learn more about what you can do with MyChart, go to [email or web address].    Your next appointment:   6 months with Dr Klein  Important Information About Sugar       

## 2022-09-10 NOTE — Progress Notes (Signed)
Patient Care Team: [name], MD as PCP - General (Internal Medicine) Deboraha Sprang, MD as PCP - Cardiology (Cardiology)   HPI  Grant Moreno. is a 81 y.o. male Seen in followup for syncope with Wide QRS Tachycardia felt to be VT , noninducibility at EPS with LV dysfunction; cath normal CA 2/15; interval  Afib & RVR cx by sinus bradycardia.  A failed antiarrhythmics, atrial fibrillation is now permanent Anitcoagulation with Apixoban no bleeding ma'am  Syncope was associated with profound bradycardia; monitoring demonstrated subsequent control of atrial fibrillation rates>> pacemaker implantation  Medtronic 9/16   Interval cardiomyopathy initially improved and then with gradual deterioration.  Concerns have been related to ventricular pacing so backup bradycardia pacing rates were decreased 6/23.  Now 3.3 %  resumed losartan at 25 mg at bedtime, he is tolerating it without difficulty, feels as good as he has in a long time.    The patient denies chest pain, shortness of breath, nocturnal dyspnea, orthopnea or peripheral edema.  There have been no palpitations, lightheadedness or syncope.    Date Cr K Mg Hgb     5/17  0.8 4.3      4/18  0.94 4.4 2.1 13.7   9/18 0.84 4.5 2.3    3/19 0.85 4.5 2.3 14.4   5/19 0.8      11/20 0.8 4.5 2.1    7/21 0.9 4.5  15.0 (8/21)   6/23 0.97 4.8      DATE TEST EF   2/15 Echo   35-40 %   5/16 Echo   55-60 %   10/21 Echo  50-55%   5/23 Echo  30-35%   9/23 Echo  25-30%   .   Thromboembolic risk factors ( age  -2, CHF-1) for a CHADSVASc Score of >=3   Past Medical History:  Diagnosis Date   Dysrhythmia    PAF, VT   Hx of dysplastic nevus 10/17/2008   Lower back. Slight to moderate atypia.   Persistent atrial fibrillation (HCC)    a. 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 s/p MDT PPM   Syncope    Ventricular tachycardia (paroxysmal)     Past Surgical History:   Procedure Laterality Date   BROW PTOSIS Bilateral 06/01/2018   Procedure: BROW PTOSIS;  Surgeon: Karle Starch, MD;  Location: [place];  Service: Ophthalmology;  Laterality: Bilateral;   ELECTROPHYSIOLOGY STUDY  01-08-14   negative EPS by Dr Lovena Le following syncopal episode and documented WCT   ELECTROPHYSIOLOGY STUDY N/A 01/09/2014   Procedure: ELECTROPHYSIOLOGY STUDY +/- ICD, +/- ILR;  Surgeon: Evans Lance, MD;  Location: [facility] CATH LAB;  Service: Cardiovascular;  Laterality: N/A;   EP IMPLANTABLE DEVICE N/A 08/08/2015   Procedure: Pacemaker Implant;  Surgeon: Deboraha Sprang, MD;  Location: [place] CV LAB;  Service: Cardiovascular;  Laterality: N/A;   EP IMPLANTABLE DEVICE N/A 08/08/2015   Procedure: Loop Recorder Removal;  Surgeon: Deboraha Sprang, MD;  Location: [place] CV LAB;  Service: Cardiovascular;  Laterality: N/A;   FINGER SURGERY     Noted the only time patient has been admitted overnight.     LEFT HEART CATHETERIZATION WITH CORONARY ANGIOGRAM N/A 01/06/2014   Procedure: LEFT HEART CATHETERIZATION WITH CORONARY ANGIOGRAM;  Surgeon: Leonie Man, MD;  Location: [facility] CATH LAB;  Service: Cardiovascular;  Laterality: N/A;   LOOP RECORDER IMPLANT  01-08-14   MDT LinQ implanted by Dr Lovena Le for syncope and WCT   PTOSIS REPAIR Bilateral 06/01/2018   Procedure: BLEPHAROPTOSIS REPAIR RESECT;  Surgeon: Karle Starch, MD;  Location: [place];  Service: Ophthalmology;  Laterality: Bilateral;    Current Outpatient Medications  Medication Sig Dispense Refill   apixaban (ELIQUIS) 5 MG TABS tablet Take 1 tablet (5 mg total) by mouth 2 (two) times daily. 180 tablet 1   diltiazem (CARDIZEM CD) 120 MG 24 hr capsule Take 1 capsule (120 mg total) by mouth daily. 90 capsule 3   losartan (COZAAR) 25 MG tablet Take 1 tablet (25 mg total) by mouth at bedtime. 90 tablet 3   mometasone (ELOCON) 0.1 % cream Apply topically as needed.     Multiple Vitamins-Minerals (PRESERVISION  AREDS) TABS Take 2 tablets by mouth daily.     pravastatin (PRAVACHOL) 20 MG tablet Take 20 mg by mouth daily.     timolol (TIMOPTIC) 0.5 % ophthalmic solution Place 1 drop into the right eye at bedtime.     traZODone (DESYREL) 100 MG tablet Take 100 mg by mouth at bedtime. Pt takes 2 tablet every night     No current facility-administered medications for this visit.  Should have treatment  No Known Allergies  Review of Systems negative except from HPI and PMH  Physical Exam Vitals:   09/10/22 1429  BP: 100/62  Pulse: 60  SpO2: 94%  Well developed and well nourished in no acute distress HENT normal Neck supple with JVP-flat Clear Device pocket well healed; without hematoma or erythema.  There is no tethering  Slow and irregularly irregular and rhythm, no  murmur Abd-soft with active BS No Clubbing cyanosis  edema Skin-warm and dry A & Oriented  Grossly normal sensory and motor function  ECG atrial fib @ 60 -13//41   Assessment and  Plan  Syncope.   Atrial fibrillation permanent  Wide QRS tachycardia   Junctional tachycardia  PVCs  LBBB infer axis, late intrinsicoid deflection   Congestive heart failure-chronic diastolic  Nonischemic cardiomyopathy resolved now recurrent  Pacemaker-Medtronic     Blood pressure limits cardiomyopathy medications, have been able to successfully introduce losartan.  They asked about Entresto, blood pressure would not tolerate.  Ventricular pacing is down to about 3%.  We will plan to recheck an echo in 6 months.  No bleeding.  Continue Eliquis 5 mg twice daily.  Device function is normal

## 2022-09-11 ENCOUNTER — Encounter: Payer: Self-pay | Admitting: Internal Medicine

## 2022-09-11 LAB — CUP PACEART INCLINIC DEVICE CHECK
Battery Remaining Longevity: 29 mo
Battery Voltage: 2.95 V
Brady Statistic RA Percent Paced: 0 %
Brady Statistic RV Percent Paced: 5.02 %
Date Time Interrogation Session: 20231025143700
Implantable Lead Connection Status: 753985
Implantable Lead Connection Status: 753985
Implantable Lead Implant Date: 20160921
Implantable Lead Implant Date: 20160921
Implantable Lead Location: 753859
Implantable Lead Location: 753860
Implantable Lead Model: 5076
Implantable Lead Model: 5076
Implantable Pulse Generator Implant Date: 20160921
Lead Channel Impedance Value: 323 Ohm
Lead Channel Impedance Value: 323 Ohm
Lead Channel Impedance Value: 361 Ohm
Lead Channel Impedance Value: 437 Ohm
Lead Channel Pacing Threshold Amplitude: 0.375 V
Lead Channel Pacing Threshold Amplitude: 1 V
Lead Channel Pacing Threshold Pulse Width: 0.4 ms
Lead Channel Pacing Threshold Pulse Width: 0.4 ms
Lead Channel Sensing Intrinsic Amplitude: 1.125 mV
Lead Channel Sensing Intrinsic Amplitude: 2.625 mV
Lead Channel Sensing Intrinsic Amplitude: 5.75 mV
Lead Channel Sensing Intrinsic Amplitude: 6.5 mV
Lead Channel Setting Pacing Amplitude: 2.5 V
Lead Channel Setting Pacing Pulse Width: 0.4 ms
Lead Channel Setting Sensing Sensitivity: 2.8 mV
Zone Setting Status: 755011
Zone Setting Status: 755011

## 2022-09-16 ENCOUNTER — Other Ambulatory Visit: Payer: Self-pay | Admitting: *Deleted

## 2022-09-16 DIAGNOSIS — I4821 Permanent atrial fibrillation: Secondary | ICD-10-CM

## 2022-09-16 MED ORDER — APIXABAN 5 MG PO TABS: 5.0000 mg | ORAL_TABLET | Freq: Two times a day (BID) | ORAL | 1 refills | Status: DC

## 2022-09-16 NOTE — Telephone Encounter (Signed)
Eliquis '5mg'$  refill request received. Patient is 81 years old, weight-92.6kg, Crea-0.97 on 05/07/2022, Diagnosis-Afib, and last seen by Dr. Caryl Comes on 09/10/2022. Dose is appropriate based on dosing criteria. Will send in refill to requested pharmacy.

## 2022-10-02 ENCOUNTER — Ambulatory Visit (INDEPENDENT_AMBULATORY_CARE_PROVIDER_SITE_OTHER): Payer: Medicare Other

## 2022-10-02 DIAGNOSIS — I429 Cardiomyopathy, unspecified: Secondary | ICD-10-CM | POA: Diagnosis not present

## 2022-10-02 LAB — CUP PACEART REMOTE DEVICE CHECK
Battery Remaining Longevity: 27 mo
Battery Voltage: 2.94 V
Brady Statistic RA Percent Paced: 0 %
Brady Statistic RV Percent Paced: 3.56 %
Date Time Interrogation Session: 20231116085839
Implantable Lead Connection Status: 753985
Implantable Lead Connection Status: 753985
Implantable Lead Implant Date: 20160921
Implantable Lead Implant Date: 20160921
Implantable Lead Location: 753859
Implantable Lead Location: 753860
Implantable Lead Model: 5076
Implantable Lead Model: 5076
Implantable Pulse Generator Implant Date: 20160921
Lead Channel Impedance Value: 304 Ohm
Lead Channel Impedance Value: 304 Ohm
Lead Channel Impedance Value: 361 Ohm
Lead Channel Impedance Value: 380 Ohm
Lead Channel Pacing Threshold Amplitude: 0.375 V
Lead Channel Pacing Threshold Amplitude: 1 V
Lead Channel Pacing Threshold Pulse Width: 0.4 ms
Lead Channel Pacing Threshold Pulse Width: 0.4 ms
Lead Channel Sensing Intrinsic Amplitude: 0.875 mV
Lead Channel Sensing Intrinsic Amplitude: 0.875 mV
Lead Channel Sensing Intrinsic Amplitude: 5.875 mV
Lead Channel Sensing Intrinsic Amplitude: 5.875 mV
Lead Channel Setting Pacing Amplitude: 2.5 V
Lead Channel Setting Pacing Pulse Width: 0.4 ms
Lead Channel Setting Sensing Sensitivity: 2.8 mV
Zone Setting Status: 755011
Zone Setting Status: 755011

## 2022-10-22 NOTE — Progress Notes (Signed)
Remote pacemaker transmission.   

## 2022-10-23 ENCOUNTER — Encounter: Payer: Self-pay | Admitting: Internal Medicine

## 2022-10-28 ENCOUNTER — Encounter: Payer: Medicare Other | Admitting: Internal Medicine

## 2022-12-02 ENCOUNTER — Ambulatory Visit: Payer: Medicare Other | Admitting: Dermatology

## 2022-12-02 VITALS — BP 124/74

## 2022-12-02 DIAGNOSIS — D229 Melanocytic nevi, unspecified: Secondary | ICD-10-CM

## 2022-12-02 DIAGNOSIS — L821 Other seborrheic keratosis: Secondary | ICD-10-CM

## 2022-12-02 DIAGNOSIS — L570 Actinic keratosis: Secondary | ICD-10-CM

## 2022-12-02 DIAGNOSIS — D225 Melanocytic nevi of trunk: Secondary | ICD-10-CM | POA: Diagnosis not present

## 2022-12-02 DIAGNOSIS — L814 Other melanin hyperpigmentation: Secondary | ICD-10-CM

## 2022-12-02 DIAGNOSIS — Z1283 Encounter for screening for malignant neoplasm of skin: Secondary | ICD-10-CM

## 2022-12-02 DIAGNOSIS — L853 Xerosis cutis: Secondary | ICD-10-CM

## 2022-12-02 DIAGNOSIS — L720 Epidermal cyst: Secondary | ICD-10-CM | POA: Diagnosis not present

## 2022-12-02 DIAGNOSIS — L578 Other skin changes due to chronic exposure to nonionizing radiation: Secondary | ICD-10-CM | POA: Diagnosis not present

## 2022-12-02 DIAGNOSIS — D692 Other nonthrombocytopenic purpura: Secondary | ICD-10-CM

## 2022-12-02 MED ORDER — FLUOROURACIL 5 % EX CREA: TOPICAL_CREAM | Freq: Two times a day (BID) | CUTANEOUS | 0 refills | Status: DC

## 2022-12-02 MED ORDER — CALCIPOTRIENE 0.005 % EX CREA: TOPICAL_CREAM | Freq: Two times a day (BID) | CUTANEOUS | 0 refills | Status: DC

## 2022-12-02 NOTE — Progress Notes (Signed)
Follow-Up Visit   Subjective  Grant Moreno. is a 82 y.o. male who presents for the following: Annual Exam.  The patient presents for Upper Body Skin Exam (UBSE) for skin cancer screening and mole check.  The patient has spots, moles and lesions to be evaluated, some may be new or changing. He has a history of Aks and dysplastic nevus of the lower back. He has a growth on his left sideburn he would like checked- he tends to pick at it.  The following portions of the chart were reviewed this encounter and updated as appropriate:       Review of Systems:  No other skin or systemic complaints except as noted in HPI or Assessment and Plan.  Objective  Well appearing patient in no apparent distress; mood and affect are within normal limits.  All skin waist up examined.  left sideburn Keratotic pink papule, Pink scaly macules on vertex scalp, forehead, temples  spinal upper back 3.0 CM subcutaneous nodule  Right Lower Back 2.5 mm medium dark brown macule    Assessment & Plan  Skin cancer screening performed today.  Actinic Damage with PreCancerous Actinic Keratoses Counseling for Topical Chemotherapy Management: Patient exhibits: - Severe, confluent actinic changes with pre-cancerous actinic keratoses that is secondary to cumulative UV radiation exposure over time - Condition that is severe; chronic, not at goal. - diffuse scaly erythematous macules and papules with underlying dyspigmentation - Discussed Prescription "Field Treatment" topical Chemotherapy for Severe, Chronic Confluent Actinic Changes with Pre-Cancerous Actinic Keratoses Field treatment involves treatment of an entire area of skin that has confluent Actinic Changes (Sun/ Ultraviolet light damage) and PreCancerous Actinic Keratoses by method of PhotoDynamic Therapy (PDT) and/or prescription Topical Chemotherapy agents such as 5-fluorouracil, 5-fluorouracil/calcipotriene, and/or imiquimod.  The purpose is to decrease  the number of clinically evident and subclinical PreCancerous lesions to prevent progression to development of skin cancer by chemically destroying early precancer changes that may or may not be visible.  It has been shown to reduce the risk of developing skin cancer in the treated area. As a result of treatment, redness, scaling, crusting, and open sores may occur during treatment course. One or more than one of these methods may be used and may have to be used several times to control, suppress and eliminate the PreCancerous changes. Discussed treatment course, expected reaction, and possible side effects. - Recommend daily broad spectrum sunscreen SPF 30+ to sun-exposed areas, reapply every 2 hours as needed.  - Staying in the shade or wearing long sleeves, sun glasses (UVA+UVB protection) and wide brim hats (4-inch brim around the entire circumference of the hat) are also recommended. - Call for new or changing lesions. - Start 5-fluorouracil/calcipotriene cream twice a day for 5-7 days to affected areas including scalp, forehead, temples. Prescription sent to [facility]. Patient advised they will receive an email to purchase the medication online and have it sent to their home. Patient provided with handout reviewing treatment course and side effects and advised to call or message [place] on MyChart with any concerns.  Reviewed course of treatment and expected reaction.  Patient advised to expect inflammation and crusting and advised that erosions are possible.  Patient advised to be diligent with sun protection during and after treatment. Counseled to keep medication out of reach of children and pets.   Lentigines - Scattered tan macules - Due to sun exposure - Benign-appearing, observe - Recommend daily broad spectrum sunscreen SPF 30+ to sun-exposed areas,  reapply every 2 hours as needed. - Call for any changes  Seborrheic Keratoses - Stuck-on, waxy, tan-brown papules  and/or plaques  - Benign-appearing - Discussed benign etiology and prognosis. - Observe - Call for any changes  Purpura - Chronic; persistent and recurrent.  Treatable, but not curable. - Violaceous macules and patches - Benign - Related to trauma, age, sun damage and/or use of blood thinners, chronic use of topical and/or oral steroids - Observe - Can use OTC arnica containing moisturizer such as Dermend Bruise Formula if desired - Call for worsening or other concerns  Xerosis - diffuse xerotic patches - recommend gentle, hydrating skin care - gentle skin care handout given  Melanocytic Nevi - Tan-brown and/or pink-flesh-colored symmetric macules and papules, including flesh papule of the left lower back  - Benign appearing on exam today - Observation - Call clinic for new or changing moles - Recommend daily use of broad spectrum spf 30+ sunscreen to sun-exposed areas.   Hypertrophic actinic keratosis left sideburn  vs ISK L sideburn, pt declines cryotherapy to other Aks, prefers topical treatment  Actinic keratoses are precancerous spots that appear secondary to cumulative UV radiation exposure/sun exposure over time. They are chronic with expected duration over 1 year. A portion of actinic keratoses will progress to squamous cell carcinoma of the skin. It is not possible to reliably predict which spots will progress to skin cancer and so treatment is recommended to prevent development of skin cancer.  Recommend daily broad spectrum sunscreen SPF 30+ to sun-exposed areas, reapply every 2 hours as needed.  Recommend staying in the shade or wearing long sleeves, sun glasses (UVA+UVB protection) and wide brim hats (4-inch brim around the entire circumference of the hat). Call for new or changing lesions.  Start fluorouracil 5% cream/calcipotriene cream Apply to scalp, forehead, temples BID x 1 week (5-7 days forehead and temples). Rx sent to [facility].    5-fluorouracil/calcipotriene cream is is a type of field treatment used to treat precancers, thin skin cancers, and areas of sun damage. Expected reaction includes irritation and mild inflammation potentially progressing to more severe inflammation including redness, scaling, crusting and open sores/erosions.  If too much irritation occurs, ensure application of only a thin layer and decrease frequency of use to achieve a tolerable level of inflammation. Recommend applying Vaseline ointment to open sores as needed.  Minimize sun exposure while under treatment. Recommend daily broad spectrum sunscreen SPF 30+ to sun-exposed areas, reapply every 2 hours as needed.       Destruction of lesion - left sideburn  Destruction method: cryotherapy   Informed consent: discussed and consent obtained   Lesion destroyed using liquid nitrogen: Yes   Region frozen until ice ball extended beyond lesion: Yes   Outcome: patient tolerated procedure well with no complications   Post-procedure details: wound care instructions given   Additional details:  Prior to procedure, discussed risks of blister formation, small wound, skin dyspigmentation, or rare scar following cryotherapy. Recommend Vaseline ointment to treated areas while healing.   fluorouracil (EFUDEX) 5 % cream - left sideburn Apply topically 2 (two) times daily. X 7 days to scalp, forehead, temples  calcipotriene (DOVONOX) 0.005 % cream - left sideburn Apply topically 2 (two) times daily. X 7 days to scalp, forehead, temples  Epidermal inclusion cyst spinal upper back  Cyst with symptoms and/or recent change.  Discussed surgical excision to remove, including resulting scar and possible recurrence.  Patient will schedule for surgery. Pre-op information given.  Nevus Right Lower Back  Benign-appearing.  Observation.  Call clinic for new or changing moles.  Recommend daily use of broad spectrum spf 30+ sunscreen to sun-exposed areas.     Return in about 6 months (around 06/02/2023) for AKs.  IJamesetta Orleans, CMA, am acting as scribe for Brendolyn Patty, MD .  Documentation: I have reviewed the above documentation for accuracy and completeness, and I agree with the above.  Brendolyn Patty MD

## 2022-12-02 NOTE — Patient Instructions (Addendum)
Cryotherapy Aftercare  Wash gently with soap and water everyday.   Apply Vaseline and Band-Aid daily until healed.    - Start 5-fluorouracil/calcipotriene cream twice a day for 7 days to scalp, 5-7 days to forehead, temples. Patient provided with handout reviewing treatment course and side effects and advised to call or message [place] on MyChart with any concerns.  Reviewed course of treatment and expected reaction.  Patient advised to expect inflammation and crusting and advised that erosions are possible.  Patient advised to be diligent with sun protection during and after treatment. Counseled to keep medication out of reach of children and pets.   5-Fluorouracil/Calcipotriene Patient Education   Actinic keratoses are the dry, red scaly spots on the skin caused by sun damage. A portion of these spots can turn into skin cancer with time, and treating them can help prevent development of skin cancer.   Treatment of these spots requires removal of the defective skin cells. There are various ways to remove actinic keratoses, including freezing with liquid nitrogen, treatment with creams, or treatment with a blue light procedure in the office.   5-fluorouracil cream is a topical cream used to treat actinic keratoses. It works by interfering with the growth of abnormal fast-growing skin cells, such as actinic keratoses. These cells peel off and are replaced by healthy ones.   5-fluorouracil/calcipotriene is a combination of the 5-fluorouracil cream with a vitamin D analog cream called calcipotriene. The calcipotriene alone does not treat actinic keratoses. However, when it is combined with 5-fluorouracil, it helps the 5-fluorouracil treat the actinic keratoses much faster so that the same results can be achieved with a much shorter treatment time.  INSTRUCTIONS FOR 5-FLUOROURACIL/CALCIPOTRIENE CREAM:   5-fluorouracil/calcipotriene cream typically only needs to be used for 4-7 days. A thin layer should  be applied twice a day to the treatment areas recommended by your physician.   If your physician prescribed you separate tubes of 5-fluourouracil and calcipotriene, apply a thin layer of 5-fluorouracil followed by a thin layer of calcipotriene.   Avoid contact with your eyes, nostrils, and mouth. Do not use 5-fluorouracil/calcipotriene cream on infected or open wounds.   You will develop redness, irritation and some crusting at areas where you have pre-cancer damage/actinic keratoses. IF YOU DEVELOP PAIN, BLEEDING, OR SIGNIFICANT CRUSTING, STOP THE TREATMENT EARLY - you have already gotten a good response and the actinic keratoses should clear up well.  Wash your hands after applying 5-fluorouracil 5% cream on your skin.   A moisturizer or sunscreen with a minimum SPF 30 should be applied each morning.   Once you have finished the treatment, you can apply a thin layer of Vaseline twice a day to irritated areas to soothe and calm the areas more quickly. If you experience significant discomfort, contact your physician.  For some patients it is necessary to repeat the treatment for best results.  SIDE EFFECTS: When using 5-fluorouracil/calcipotriene cream, you may have mild irritation, such as redness, dryness, swelling, or a mild burning sensation. This usually resolves within 2 weeks. The more actinic keratoses you have, the more redness and inflammation you can expect during treatment. Eye irritation has been reported rarely. If this occurs, please let us know.  If you have any trouble using this cream, please call the office. If you have any other questions about this information, please do not hesitate to ask me before you leave the office.   Gentle Skin Care Guide  1. Bathe no more than once a day.  2. Avoid bathing in hot water  3. Use a mild soap like Dove, Vanicream, Cetaphil, CeraVe. Can use Lever 2000 or Cetaphil antibacterial soap  4. Use soap only where you need it. On most  days, use it under your arms, between your legs, and on your feet. Let the water rinse other areas unless visibly dirty.  5. When you get out of the bath/shower, use a towel to gently blot your skin dry, don't rub it.  6. While your skin is still a little damp, apply a moisturizing cream such as Vanicream, CeraVe, Cetaphil, Eucerin, Sarna lotion or plain Vaseline Jelly. For hands apply Neutrogena [place] Hand Cream or Excipial Hand Cream.  7. Reapply moisturizer any time you start to itch or feel dry.  8. Sometimes using free and clear laundry detergents can be helpful. Fabric softener sheets should be avoided. Downy Free & Gentle liquid, or any liquid fabric softener that is free of dyes and perfumes, it acceptable to use  9. If your doctor has given you prescription creams you may apply moisturizers over them      Pre-Operative Instructions  You are scheduled for a surgical procedure at [facility]. We recommend you read the following instructions. If you have any questions or concerns, please call the office at [phone].  Shower and wash the entire body with soap and water the day of your surgery paying special attention to cleansing at and around the planned surgery site.  Avoid aspirin or aspirin containing products at least fourteen (14) days prior to your surgical procedure and for at least one week (7 Days) after your surgical procedure. If you take aspirin on a regular basis for heart disease or history of stroke or for any other reason, we may recommend you continue taking aspirin but please notify us if you take this on a regular basis. Aspirin can cause more bleeding to occur during surgery as well as prolonged bleeding and bruising after surgery.   Avoid other nonsteroidal pain medications at least one week prior to surgery and at least one week prior to your surgery. These include medications such as Ibuprofen (Motrin, Advil and Nuprin), Naprosyn, Voltaren,  Relafen, etc. If medications are used for therapeutic reasons, please inform us as they can cause increased bleeding or prolonged bleeding during and bruising after surgical procedures.   Please advise [place] if you are taking any "blood thinner" medications such as Coumadin or Dipyridamole or Plavix or similar medications. These cause increased bleeding and prolonged bleeding during procedures and bruising after surgical procedures. We may have to consider discontinuing these medications briefly prior to and shortly after your surgery if safe to do so.   Please inform us of all medications you are currently taking. All medications that are taken regularly should be taken the day of surgery as you always do. Nevertheless, we need to be informed of what medications you are taking prior to surgery to know whether they will affect the procedure or cause any complications.   Please inform us of any medication allergies. Also inform us of whether you have allergies to Latex or rubber products or whether you have had any adverse reaction to Lidocaine or Epinephrine.  Please inform us of any prosthetic or artificial body parts such as artificial heart valve, joint replacements, etc., or similar condition that might require preoperative antibiotics.   We recommend avoidance of alcohol at least two weeks prior to surgery and continued avoidance for at least two weeks after surgery.  We recommend discontinuation of tobacco smoking at least two weeks prior to surgery and continued abstinence for at least two weeks after surgery.  Do not plan strenuous exercise, strenuous work or strenuous lifting for approximately four weeks after your surgery.   We request if you are unable to make your scheduled surgical appointment, please call us at least a week in advance or as soon as you are aware of a problem so that we can cancel or reschedule the appointment.   You MAY TAKE TYLENOL (acetaminophen) for pain as it is not  a blood thinner.   PLEASE PLAN TO BE IN TOWN FOR TWO WEEKS FOLLOWING SURGERY, THIS IS IMPORTANT SO YOU CAN BE CHECKED FOR DRESSING CHANGES, SUTURE REMOVAL AND TO MONITOR FOR POSSIBLE COMPLICATIONS.    Due to recent changes in healthcare laws, you may see results of your pathology and/or laboratory studies on MyChart before the doctors have had a chance to review them. We understand that in some cases there may be results that are confusing or concerning to you. Please understand that not all results are received at the same time and often the doctors may need to interpret multiple results in order to provide you with the best plan of care or course of treatment. Therefore, we ask that you please give [place] 2 business days to thoroughly review all your results before contacting the office for clarification. Should we see a critical lab result, you will be contacted sooner.   If You Need Anything After Your Visit  If you have any questions or concerns for your doctor, please call our main line at [phone] and press option 4 to reach your doctor's medical assistant. If no one answers, please leave a voicemail as directed and we will return your call as soon as possible. Messages left after 4 pm will be answered the following business day.   You may also send [place] a message via [place]. We typically respond to MyChart messages within 1-2 business days.  For prescription refills, please ask your pharmacy to contact our office. Our fax number is [phone].  If you have an urgent issue when the clinic is closed that cannot wait until the next business day, you can page your doctor at the number below.    Please note that while we do our best to be available for urgent issues outside of office hours, we are not available 24/7.   If you have an urgent issue and are unable to reach [place], you may choose to seek medical care at your doctor's office, retail clinic, urgent care center, or emergency room.  If  you have a medical emergency, please immediately call 911 or go to the emergency department.  Pager Numbers  - Dr. Nehemiah Massed: [phone]  - Dr. Laurence Ferrari: [phone]  - Dr. Nicole Kindred: [phone]  In the event of inclement weather, please call our main line at [phone] for an update on the status of any delays or closures.  Dermatology Medication Tips: Please keep the boxes that topical medications come in in order to help keep track of the instructions about where and how to use these. Pharmacies typically print the medication instructions only on the boxes and not directly on the medication tubes.   If your medication is too expensive, please contact our office at [phone] option 4 or send [place] a message through [place].   We are unable to tell what your co-pay for medications will be in advance as this is different depending on  your insurance coverage. However, we may be able to find a substitute medication at lower cost or fill out paperwork to get insurance to cover a needed medication.   If a prior authorization is required to get your medication covered by your insurance company, please allow [place] 1-2 business days to complete this process.  Drug prices often vary depending on where the prescription is filled and some pharmacies may offer cheaper prices.  The website www.goodrx.com contains coupons for medications through different pharmacies. The prices here do not account for what the cost may be with help from insurance (it may be cheaper with your insurance), but the website can give you the price if you did not use any insurance.  - You can print the associated coupon and take it with your prescription to the pharmacy.  - You may also stop by our office during regular business hours and pick up a GoodRx coupon card.  - If you need your prescription sent electronically to a different pharmacy, notify our office through [facility] or by phone at [phone] option  4.     Si Usted Necesita Algo Despus de Su Visita  Tambin puede enviarnos un mensaje a travs de Pharmacist, community. Por lo general respondemos a los mensajes de MyChart en el transcurso de 1 a 2 das hbiles.  Para renovar recetas, por favor pida a su farmacia que se ponga en contacto con nuestra oficina. Harland Dingwall de fax es [place] [phone].  Si tiene un asunto urgente cuando la clnica est cerrada y que no puede esperar hasta el siguiente da hbil, puede llamar/localizar a su doctor(a) al nmero que aparece a continuacin.   Por favor, tenga en cuenta que aunque hacemos todo lo posible para estar disponibles para asuntos urgentes fuera del horario de [place], no estamos disponibles las 24 horas del da, los 7 das de la [place].   Si tiene un problema urgente y no puede comunicarse con nosotros, puede optar por buscar atencin mdica  en el consultorio de su doctor(a), en una clnica privada, en un centro de atencin urgente o en una sala de emergencias.  Si tiene Engineering geologist, por favor llame inmediatamente al 911 o vaya a la sala de emergencias.  Nmeros de bper  - Dr. Nehemiah Massed: [phone]  - Dra. Moye: [phone]  - Dra. Nicole Kindred: [phone]  En caso de inclemencias del [place], por favor llame a Johnsie Kindred principal al [phone] para una actualizacin sobre el [place] de cualquier retraso o cierre.  Consejos para la medicacin en dermatologa: Por favor, guarde las cajas en las que vienen los medicamentos de uso tpico para ayudarle a seguir las instrucciones sobre dnde y cmo usarlos. Las farmacias generalmente imprimen las instrucciones del medicamento slo en las cajas y no directamente en los tubos del [place].   Si su medicamento es muy caro, por favor, pngase en contacto con Zigmund Daniel llamando al [phone] y presione la opcin 4 o envenos un mensaje a travs de Pharmacist, community.   No podemos decirle cul ser su copago por los medicamentos por  adelantado ya que esto es diferente dependiendo de la cobertura de su seguro. Sin embargo, es posible que podamos encontrar un medicamento sustituto a Electrical engineer un formulario para que el seguro cubra el medicamento que se considera necesario.   Si se requiere una autorizacin previa para que su compaa de seguros [place] su medicamento, por favor permtanos de 1 a 2 das hbiles para completar este proceso.  Los precios de los medicamentos varan con frecuencia dependiendo del Environmental consultant de dnde se surte la receta y alguna farmacias pueden ofrecer precios ms baratos.  El sitio web www.goodrx.com tiene cupones para medicamentos de Airline pilot. Los precios aqu no tienen en cuenta lo que podra costar con la ayuda del seguro (puede ser ms barato con su seguro), pero el sitio web puede darle el precio si no utiliz Research scientist (physical sciences).  - Puede imprimir el cupn correspondiente y llevarlo con su receta a la farmacia.  - Tambin puede pasar por nuestra oficina durante el horario de atencin regular y Charity fundraiser una tarjeta de cupones de GoodRx.  - Si necesita que su receta se enve electrnicamente a una farmacia diferente, informe a nuestra oficina a travs de MyChart de [place] o por telfono llamando al [phone] y presione la opcin 4.

## 2022-12-12 ENCOUNTER — Encounter: Payer: Self-pay | Admitting: Internal Medicine

## 2022-12-12 DIAGNOSIS — I4821 Permanent atrial fibrillation: Secondary | ICD-10-CM

## 2022-12-12 MED ORDER — APIXABAN 5 MG PO TABS: 5.0000 mg | ORAL_TABLET | Freq: Two times a day (BID) | ORAL | 1 refills | Status: DC

## 2022-12-12 NOTE — Telephone Encounter (Signed)
Prescription refill request for Eliquis received. Indication: Afib  Last office visit: 09/10/22 Grant Moreno)  Scr: 0.8 (10/07/22)  Age: 82 Weight: 92.6kg  Appropriate dose. Refill sent.

## 2023-01-01 ENCOUNTER — Ambulatory Visit (INDEPENDENT_AMBULATORY_CARE_PROVIDER_SITE_OTHER): Payer: Medicare Other

## 2023-01-01 DIAGNOSIS — I428 Other cardiomyopathies: Secondary | ICD-10-CM | POA: Diagnosis not present

## 2023-01-01 LAB — CUP PACEART REMOTE DEVICE CHECK
Battery Remaining Longevity: 23 mo
Battery Voltage: 2.93 V
Brady Statistic RA Percent Paced: 0 %
Brady Statistic RV Percent Paced: 3.69 %
Date Time Interrogation Session: 20240215085041
Implantable Lead Connection Status: 753985
Implantable Lead Connection Status: 753985
Implantable Lead Implant Date: 20160921
Implantable Lead Implant Date: 20160921
Implantable Lead Location: 753859
Implantable Lead Location: 753860
Implantable Lead Model: 5076
Implantable Lead Model: 5076
Implantable Pulse Generator Implant Date: 20160921
Lead Channel Impedance Value: 285 Ohm
Lead Channel Impedance Value: 304 Ohm
Lead Channel Impedance Value: 361 Ohm
Lead Channel Impedance Value: 437 Ohm
Lead Channel Pacing Threshold Amplitude: 0.375 V
Lead Channel Pacing Threshold Amplitude: 1 V
Lead Channel Pacing Threshold Pulse Width: 0.4 ms
Lead Channel Pacing Threshold Pulse Width: 0.4 ms
Lead Channel Sensing Intrinsic Amplitude: 1.375 mV
Lead Channel Sensing Intrinsic Amplitude: 1.375 mV
Lead Channel Sensing Intrinsic Amplitude: 6.75 mV
Lead Channel Sensing Intrinsic Amplitude: 6.75 mV
Lead Channel Setting Pacing Amplitude: 2.5 V
Lead Channel Setting Pacing Pulse Width: 0.4 ms
Lead Channel Setting Sensing Sensitivity: 2.8 mV
Zone Setting Status: 755011
Zone Setting Status: 755011

## 2023-01-07 ENCOUNTER — Telehealth: Payer: Self-pay

## 2023-01-07 NOTE — Telephone Encounter (Signed)
Pt states he sent a transmission because he passed out last night around 12:15am. He would like for the nurse to review it to see if anything is on it.

## 2023-01-07 NOTE — Telephone Encounter (Signed)
Attempted phone call to pt and left voicemail message to contact office at [phone].

## 2023-01-07 NOTE — Telephone Encounter (Signed)
Transmission reviewed.  Normal device function.  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  V paces 3%.  No episodes noted on device.   Pt advised of normal remote transmission.

## 2023-01-12 ENCOUNTER — Ambulatory Visit (INDEPENDENT_AMBULATORY_CARE_PROVIDER_SITE_OTHER): Payer: Medicare Other | Admitting: Dermatology

## 2023-01-12 DIAGNOSIS — D485 Neoplasm of uncertain behavior of skin: Secondary | ICD-10-CM

## 2023-01-12 DIAGNOSIS — L720 Epidermal cyst: Secondary | ICD-10-CM | POA: Diagnosis not present

## 2023-01-12 NOTE — Telephone Encounter (Signed)
Attempted phone call to pt.  Mobile number the call was sent directly to voicemail.  No message left at this time.

## 2023-01-12 NOTE — Patient Instructions (Signed)
Wound Care Instructions for After Surgery  On the day following your surgery, you should begin doing daily dressing changes until your sutures are removed: Remove the bandage. Cleanse the wound gently with soap and water.  Make sure you then dry the skin surrounding the wound completely or the tape will not stick to the skin. Do not use cotton balls on the wound. After the wound is clean and dry, apply the ointment (either prescription antibiotic prescribed by your doctor or plain Vaseline if nothing was prescribed) gently with a Q-tip. If you are using a bandaid to cover: Apply a bandaid large enough to cover the entire wound. If you do not have a bandaid large enough to cover the wound OR if you are sensitive to bandaid adhesive: Cut a non-stick pad (such as Telfa) to fit the size of the wound.  Cover the wound with the non-stick pad. If the wound is draining, you may want to add a small amount of gauze on top of the non-stick pad for a little added compression to the area. Use tape to seal the area completely.  For the next 1-2 weeks: Be sure to keep the wound moist with ointment 24/7 to ensure best healing. If you are unable to cover the wound with a bandage to hold the ointment in place, you may need to reapply the ointment several times a day. Do not bend over or lift heavy items to reduce the chance of elevated blood pressure to the wound. Do not participate in particularly strenuous activities.  Below is a list of dressing supplies you might need.  Cotton-tipped applicators - Q-tips Gauze pads (2x2 and/or 4x4) - All-Purpose Sponges New and clean tube of petroleum jelly (Vaseline) OR prescription antibiotic ointment if prescribed Either a bandaid large enough to cover the entire wound OR non-stick dressing material (Telfa) and Tape (Paper or Hypafix)  FOR ADULT SURGERY PATIENTS: If you need something for pain relief, you may take 1 extra strength Tylenol (acetaminophen) and 2  ibuprofen (200 mg) together every 4 hours as needed. (Do not take these medications if you are allergic to them or if you know you cannot take them for any other reason). Typically you may only need pain medication for 1-3 days.   Comments on the Post-Operative Period Slight swelling and redness often appear around the wound. This is normal and will disappear within several days following the surgery. The healing wound will drain a brownish-red-yellow discharge during healing. This is a normal phase of wound healing. As the wound begins to heal, the drainage may increase in amount. Again, this drainage is normal. Notify us if the drainage becomes persistently bloody, excessively swollen, or intensely painful or develops a foul odor or red streaks.  The healing wound will also typically be itchy. This is normal. If you have severe or persistent pain, Notify us if the discomfort is severe or persistent. Avoid alcoholic beverages when taking pain medicine.  In Case of Wound Hemorrhage A wound hemorrhage is when the bandage suddenly becomes soaked with bright red blood and flows profusely. If this happens, sit down or lie down with your head elevated. If the wound has a dressing on it, do not remove the dressing. Apply pressure to the existing gauze. If the wound is not covered, use a gauze pad to apply pressure and continue applying the pressure for 20 minutes without peeking. DO NOT COVER THE WOUND WITH A LARGE TOWEL OR [place] CLOTH. Release your hand from the  wound site but do not remove the dressing. If the bleeding has stopped, gently clean around the wound. Leave the dressing in place for 24 hours if possible. This wait time allows the blood vessels to close off so that you do not spark a new round of bleeding by disrupting the newly clotted blood vessels with an immediate dressing change. If the bleeding does not subside, continue to hold pressure for 40 minutes. If bleeding continues, page your  physician, contact an After Hours clinic or go to the Emergency Room.  Due to recent changes in healthcare laws, you may see results of your pathology and/or laboratory studies on MyChart before the doctors have had a chance to review them. We understand that in some cases there may be results that are confusing or concerning to you. Please understand that not all results are received at the same time and often the doctors may need to interpret multiple results in order to provide you with the best plan of care or course of treatment. Therefore, we ask that you please give [place] 2 business days to thoroughly review all your results before contacting the office for clarification. Should we see a critical lab result, you will be contacted sooner.   If You Need Anything After Your Visit  If you have any questions or concerns for your doctor, please call our main line at [phone] and press option 4 to reach your doctor's medical assistant. If no one answers, please leave a voicemail as directed and we will return your call as soon as possible. Messages left after 4 pm will be answered the following business day.   You may also send [place] a message via [place]. We typically respond to MyChart messages within 1-2 business days.  For prescription refills, please ask your pharmacy to contact our office. Our fax number is [phone].  If you have an urgent issue when the clinic is closed that cannot wait until the next business day, you can page your doctor at the number below.    Please note that while we do our best to be available for urgent issues outside of office hours, we are not available 24/7.   If you have an urgent issue and are unable to reach [place], you may choose to seek medical care at your doctor's office, retail clinic, urgent care center, or emergency room.  If you have a medical emergency, please immediately call 911 or go to the emergency department.  Pager Numbers  - Dr. Nehemiah Massed:  [phone]  - Dr. Laurence Ferrari: [phone]  - Dr. Nicole Kindred: [phone]  In the event of inclement weather, please call our main line at [phone] for an update on the status of any delays or closures.  Dermatology Medication Tips: Please keep the boxes that topical medications come in in order to help keep track of the instructions about where and how to use these. Pharmacies typically print the medication instructions only on the boxes and not directly on the medication tubes.   If your medication is too expensive, please contact our office at [phone] option 4 or send [place] a message through [place].   We are unable to tell what your co-pay for medications will be in advance as this is different depending on your insurance coverage. However, we may be able to find a substitute medication at lower cost or fill out paperwork to get insurance to cover a needed medication.   If a prior authorization is required to get your medication covered by  your insurance company, please allow [place] 1-2 business days to complete this process.  Drug prices often vary depending on where the prescription is filled and some pharmacies may offer cheaper prices.  The website www.goodrx.com contains coupons for medications through different pharmacies. The prices here do not account for what the cost may be with help from insurance (it may be cheaper with your insurance), but the website can give you the price if you did not use any insurance.  - You can print the associated coupon and take it with your prescription to the pharmacy.  - You may also stop by our office during regular business hours and pick up a GoodRx coupon card.  - If you need your prescription sent electronically to a different pharmacy, notify our office through [facility] or by phone at [phone] option 4.     Si Usted Necesita Algo Despus de Su Visita  Tambin puede enviarnos un mensaje a travs de Pharmacist, community. Por lo general  respondemos a los mensajes de MyChart en el transcurso de 1 a 2 das hbiles.  Para renovar recetas, por favor pida a su farmacia que se ponga en contacto con nuestra oficina. Harland Dingwall de fax es [place] [phone].  Si tiene un asunto urgente cuando la clnica est cerrada y que no puede esperar hasta el siguiente da hbil, puede llamar/localizar a su doctor(a) al nmero que aparece a continuacin.   Por favor, tenga en cuenta que aunque hacemos todo lo posible para estar disponibles para asuntos urgentes fuera del horario de [place], no estamos disponibles las 24 horas del da, los 7 das de la [place].   Si tiene un problema urgente y no puede comunicarse con nosotros, puede optar por buscar atencin mdica  en el consultorio de su doctor(a), en una clnica privada, en un centro de atencin urgente o en una sala de emergencias.  Si tiene Engineering geologist, por favor llame inmediatamente al 911 o vaya a la sala de emergencias.  Nmeros de bper  - Dr. Nehemiah Massed: [phone]  - Dra. Moye: [phone]  - Dra. Nicole Kindred: [phone]  En caso de inclemencias del [place], por favor llame a Johnsie Kindred principal al [phone] para una actualizacin sobre el [place] de cualquier retraso o cierre.  Consejos para la medicacin en dermatologa: Por favor, guarde las cajas en las que vienen los medicamentos de uso tpico para ayudarle a seguir las instrucciones sobre dnde y cmo usarlos. Las farmacias generalmente imprimen las instrucciones del medicamento slo en las cajas y no directamente en los tubos del [place].   Si su medicamento es muy caro, por favor, pngase en contacto con Zigmund Daniel llamando al [phone] y presione la opcin 4 o envenos un mensaje a travs de Pharmacist, community.   No podemos decirle cul ser su copago por los medicamentos por adelantado ya que esto es diferente dependiendo de la cobertura de su seguro. Sin embargo, es posible que podamos encontrar un  medicamento sustituto a Electrical engineer un formulario para que el seguro cubra el medicamento que se considera necesario.   Si se requiere una autorizacin previa para que su compaa de seguros [place] su medicamento, por favor permtanos de 1 a 2 das hbiles para completar este proceso.  Los precios de los medicamentos varan con frecuencia dependiendo del Environmental consultant de dnde se surte la receta y alguna farmacias pueden ofrecer precios ms baratos.  El sitio web www.goodrx.com tiene cupones para medicamentos de Airline pilot. Los precios aqu no tienen  en cuenta lo que podra costar con la ayuda del seguro (puede ser ms barato con su seguro), pero el sitio web puede darle el precio si no Field seismologist.  - Puede imprimir el cupn correspondiente y llevarlo con su receta a la farmacia.  - Tambin puede pasar por nuestra oficina durante el horario de atencin regular y Charity fundraiser una tarjeta de cupones de GoodRx.  - Si necesita que su receta se enve electrnicamente a una farmacia diferente, informe a nuestra oficina a travs de MyChart de [place] o por telfono llamando al [phone] y presione la opcin 4.

## 2023-01-12 NOTE — Progress Notes (Signed)
   Follow-Up Visit   Subjective  Grant Moreno. is a 82 y.o. male who presents for the following: Procedure (Patient here today for excision of cyst at spinal upper back.).Growing.  Patient accompanied by wife.   The following portions of the chart were reviewed this encounter and updated as appropriate:       Review of Systems:  No other skin or systemic complaints except as noted in HPI or Assessment and Plan.  Objective  Well appearing patient in no apparent distress; mood and affect are within normal limits.  A focused examination was performed including back. Relevant physical exam findings are noted in the Assessment and Plan.  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  Skin excision  Lesion length (cm):  3 Lesion width (cm):  3 Margin per side (cm):  0.1 Total excision diameter (cm):  3.2 Informed consent: discussed and consent obtained   Timeout: patient name, date of birth, surgical site, and procedure verified   Procedure prep:  Patient was prepped and draped in usual sterile fashion Prep type:  Povidone-iodine Anesthesia: the lesion was anesthetized in a standard fashion   Anesthetic:  1% lidocaine w/ epinephrine 1-100,000 buffered w/ 8.4% NaHCO3 (7 cc lido w/epi, 6 cc bupivicaine) Instrument used: #15 blade   Hemostasis achieved with: pressure and electrodesiccation   Outcome: patient tolerated procedure well with no complications    Skin repair Complexity:  Intermediate Final length (cm):  2.6 Informed consent: discussed and consent obtained   Reason for type of repair: reduce tension to allow closure, reduce the risk of dehiscence, infection, and necrosis, reduce subcutaneous dead space and avoid a hematoma, preserve normal anatomical and functional relationships and enhance both functionality and cosmetic results   Undermining: edges undermined   Subcutaneous layers (deep  stitches):  Suture size:  3-0 Suture type: Vicryl (polyglactin 910)   Subcutaneous suture technique: inverted dermal. Fine/surface layer approximation (top stitches):  Suture size:  3-0 Suture type: nylon   Stitches: vertical mattress and simple interrupted   Suture removal (days):  7 Hemostasis achieved with: suture Outcome: patient tolerated procedure well with no complications   Post-procedure details: sterile dressing applied and wound care instructions given   Dressing type: pressure dressing (Mupirocin ointment)    Specimen 1 - Surgical pathology Differential Diagnosis: Cyst vs Other  Check Margins: No 3.0 CM subcutaneous nodule   Return in about 1 week (around 01/19/2023) for Suture Removal.  Graciella Belton, RMA, am acting as scribe for Brendolyn Patty, MD .  Documentation: I have reviewed the above documentation for accuracy and completeness, and I agree with the above.  Brendolyn Patty MD

## 2023-01-12 NOTE — Telephone Encounter (Signed)
Attempted phone call to pt's home number.  Unable to leave voicemail as message states not accepting voicemail messages.

## 2023-01-17 ENCOUNTER — Encounter: Payer: Self-pay | Admitting: Internal Medicine

## 2023-01-19 ENCOUNTER — Ambulatory Visit (INDEPENDENT_AMBULATORY_CARE_PROVIDER_SITE_OTHER): Payer: Medicare Other | Admitting: Dermatology

## 2023-01-19 DIAGNOSIS — Z4802 Encounter for removal of sutures: Secondary | ICD-10-CM

## 2023-01-19 DIAGNOSIS — L820 Inflamed seborrheic keratosis: Secondary | ICD-10-CM

## 2023-01-19 NOTE — Patient Instructions (Addendum)
 Cryotherapy Aftercare  Wash gently with soap and water everyday.   Apply Vaseline and Band-Aid daily until healed. Due to recent changes in healthcare laws, you may see results of your pathology and/or laboratory studies on MyChart before the doctors have had a chance to review them. We understand that in some cases there may be results that are confusing or concerning to you. Please understand that not all results are received at the same time and often the doctors may need to interpret multiple results in order to provide you with the best plan of care or course of treatment. Therefore, we ask that you please give us 2 business days to thoroughly review all your results before contacting the office for clarification. Should we see a critical lab result, you will be contacted sooner.   If You Need Anything After Your Visit  If you have any questions or concerns for your doctor, please call our main line at [phone] and press option 4 to reach your doctor's medical assistant. If no one answers, please leave a voicemail as directed and we will return your call as soon as possible. Messages left after 4 pm will be answered the following business day.   You may also send us a message via MyChart. We typically respond to MyChart messages within 1-2 business days.  For prescription refills, please ask your pharmacy to contact our office. Our fax number is [phone].  If you have an urgent issue when the clinic is closed that cannot wait until the next business day, you can page your doctor at the number below.    Please note that while we do our best to be available for urgent issues outside of office hours, we are not available 24/7.   If you have an urgent issue and are unable to reach us, you may choose to seek medical care at your doctor's office, retail clinic, urgent care center, or emergency room.  If you have a medical emergency, please immediately call 911 or go to the emergency  department.  Pager Numbers  - Dr. Kowalski: [phone]  - Dr. Moye: [phone]  - Dr. Stewart: [phone]  In the event of inclement weather, please call our main line at [phone] for an update on the status of any delays or closures.  Dermatology Medication Tips: Please keep the boxes that topical medications come in in order to help keep track of the instructions about where and how to use these. Pharmacies typically print the medication instructions only on the boxes and not directly on the medication tubes.   If your medication is too expensive, please contact our office at [phone] option 4 or send us a message through MyChart.   We are unable to tell what your co-pay for medications will be in advance as this is different depending on your insurance coverage. However, we may be able to find a substitute medication at lower cost or fill out paperwork to get insurance to cover a needed medication.   If a prior authorization is required to get your medication covered by your insurance company, please allow us 1-2 business days to complete this process.  Drug prices often vary depending on where the prescription is filled and some pharmacies may offer cheaper prices.  The website www.goodrx.com contains coupons for medications through different pharmacies. The prices here do not account for what the cost may be with help from insurance (it may be cheaper with your insurance), but the website can give you the   price if you did not use any insurance.  - You can print the associated coupon and take it with your prescription to the pharmacy.  - You may also stop by our office during regular business hours and pick up a GoodRx coupon card.  - If you need your prescription sent electronically to a different pharmacy, notify our office through [place] MyChart or by phone at [phone] option 4.     Si Usted Necesita Algo Despus de Su Visita  Tambin puede enviarnos un  mensaje a travs de MyChart. Por lo general respondemos a los mensajes de MyChart en el transcurso de 1 a 2 das hbiles.  Para renovar recetas, por favor pida a su farmacia que se ponga en contacto con nuestra oficina. Nuestro nmero de fax es el [phone].  Si tiene un asunto urgente cuando la clnica est cerrada y que no puede esperar hasta el siguiente da hbil, puede llamar/localizar a su doctor(a) al nmero que aparece a continuacin.   Por favor, tenga en cuenta que aunque hacemos todo lo posible para estar disponibles para asuntos urgentes fuera del horario de oficina, no estamos disponibles las 24 horas del da, los 7 das de la semana.   Si tiene un problema urgente y no puede comunicarse con nosotros, puede optar por buscar atencin mdica  en el consultorio de su doctor(a), en una clnica privada, en un centro de atencin urgente o en una sala de emergencias.  Si tiene una emergencia mdica, por favor llame inmediatamente al 911 o vaya a la sala de emergencias.  Nmeros de bper  - Dr. Kowalski: [phone]  - Dra. Moye: [phone]  - Dra. Stewart: [phone]  En caso de inclemencias del tiempo, por favor llame a nuestra lnea principal al [phone] para una actualizacin sobre el estado de cualquier retraso o cierre.  Consejos para la medicacin en dermatologa: Por favor, guarde las cajas en las que vienen los medicamentos de uso tpico para ayudarle a seguir las instrucciones sobre dnde y cmo usarlos. Las farmacias generalmente imprimen las instrucciones del medicamento slo en las cajas y no directamente en los tubos del medicamento.   Si su medicamento es muy caro, por favor, pngase en contacto con nuestra oficina llamando al [phone] y presione la opcin 4 o envenos un mensaje a travs de MyChart.   No podemos decirle cul ser su copago por los medicamentos por adelantado ya que esto es diferente dependiendo de la cobertura de su seguro. Sin embargo,  es posible que podamos encontrar un medicamento sustituto a menor costo o llenar un formulario para que el seguro cubra el medicamento que se considera necesario.   Si se requiere una autorizacin previa para que su compaa de seguros cubra su medicamento, por favor permtanos de 1 a 2 das hbiles para completar este proceso.  Los precios de los medicamentos varan con frecuencia dependiendo del lugar de dnde se surte la receta y alguna farmacias pueden ofrecer precios ms baratos.  El sitio web www.goodrx.com tiene cupones para medicamentos de diferentes farmacias. Los precios aqu no tienen en cuenta lo que podra costar con la ayuda del seguro (puede ser ms barato con su seguro), pero el sitio web puede darle el precio si no utiliz ningn seguro.  - Puede imprimir el cupn correspondiente y llevarlo con su receta a la farmacia.  - Tambin puede pasar por nuestra oficina durante el horario de atencin regular y recoger una tarjeta de cupones de GoodRx.  - Si necesita que   su receta se enve electrnicamente a una farmacia diferente, informe a nuestra oficina a travs de MyChart de [place] o por telfono llamando al [phone] y presione la opcin 4.  

## 2023-01-19 NOTE — Progress Notes (Signed)
   Follow-Up Visit   Subjective  Grant Moreno. is a 82 y.o. male who presents for the following: Suture / Staple Removal.   The following portions of the chart were reviewed this encounter and updated as appropriate:       Review of Systems:  No other skin or systemic complaints except as noted in HPI or Assessment and Plan.  Objective  Well appearing patient in no apparent distress; mood and affect are within normal limits.  A focused examination was performed including back. Relevant physical exam findings are noted in the Assessment and Plan.  right medial upper ankle Erythematous stuck-on, waxy papule or plaque 2.5 cm    Assessment & Plan  Inflamed seborrheic keratosis right medial upper ankle  Symptomatic, irritating, patient would like treated.  Additional treatments may be needed to clear.   Destruction of lesion - right medial upper ankle  Destruction method: cryotherapy   Informed consent: discussed and consent obtained   Lesion destroyed using liquid nitrogen: Yes   Region frozen until ice ball extended beyond lesion: Yes   Outcome: patient tolerated procedure well with no complications   Post-procedure details: wound care instructions given   Additional details:  Prior to procedure, discussed risks of blister formation, small wound, skin dyspigmentation, or rare scar following cryotherapy. Recommend Vaseline ointment to treated areas while healing.    Encounter for Removal of Sutures - Incision site at the spinal upper back is clean, dry and intact - Wound cleansed, sutures removed, wound cleansed and steri strips applied.  - Discussed pathology results showing benign cyst  - Patient advised to keep steri-strips dry until they fall off. - Scars remodel for a full year. - Once steri-strips fall off, patient can apply over-the-counter silicone scar cream each night to help with scar remodeling if desired. - Patient advised to call with any concerns or if they  notice any new or changing lesions.  Return in 10 months (on 11/21/2023) for as scheduled, UBSE.  Grant Moreno, Grant Moreno, am acting as scribe for Grant Patty, MD .  Documentation: I have reviewed the above documentation for accuracy and completeness, and I agree with the above.  Grant Patty MD

## 2023-01-27 NOTE — Progress Notes (Signed)
Remote pacemaker transmission.   

## 2023-03-23 ENCOUNTER — Other Ambulatory Visit: Payer: Self-pay | Admitting: Internal Medicine

## 2023-03-27 ENCOUNTER — Other Ambulatory Visit: Payer: Self-pay | Admitting: Internal Medicine

## 2023-03-27 ENCOUNTER — Encounter: Payer: Self-pay | Admitting: Internal Medicine

## 2023-03-27 ENCOUNTER — Ambulatory Visit: Payer: Medicare Other | Attending: Internal Medicine | Admitting: Internal Medicine

## 2023-03-27 VITALS — BP 118/60 | HR 73 | Ht 76.0 in | Wt 205.4 lb

## 2023-03-27 DIAGNOSIS — Z959 Presence of cardiac and vascular implant and graft, unspecified: Secondary | ICD-10-CM

## 2023-03-27 DIAGNOSIS — R55 Syncope and collapse: Secondary | ICD-10-CM

## 2023-03-27 DIAGNOSIS — Z79899 Other long term (current) drug therapy: Secondary | ICD-10-CM

## 2023-03-27 DIAGNOSIS — R000 Tachycardia, unspecified: Secondary | ICD-10-CM

## 2023-03-27 DIAGNOSIS — I4821 Permanent atrial fibrillation: Secondary | ICD-10-CM

## 2023-03-27 DIAGNOSIS — I493 Ventricular premature depolarization: Secondary | ICD-10-CM | POA: Diagnosis not present

## 2023-03-27 DIAGNOSIS — I472 Ventricular tachycardia, unspecified: Secondary | ICD-10-CM

## 2023-03-27 DIAGNOSIS — I428 Other cardiomyopathies: Secondary | ICD-10-CM

## 2023-03-27 DIAGNOSIS — I5032 Chronic diastolic (congestive) heart failure: Secondary | ICD-10-CM

## 2023-03-27 MED ORDER — SPIRONOLACTONE 25 MG PO TABS: 12.5000 mg | ORAL_TABLET | Freq: Every day | ORAL | 1 refills | Status: DC

## 2023-03-27 MED ORDER — LOSARTAN POTASSIUM 25 MG PO TABS: 25.0000 mg | ORAL_TABLET | Freq: Every day | ORAL | 3 refills | Status: DC

## 2023-03-27 NOTE — Progress Notes (Signed)
Patient Care Team: [name], MD as PCP - General (Internal Medicine) [name], MD as PCP - Cardiology (Cardiology)   HPI  Grant Moreno. is a 82 y.o. male Seen in followup for syncope with Wide QRS Tachycardia felt to be VT , noninducibility at EPS with LV dysfunction; cath normal CA 2/15; interval  Afib & RVR cx by sinus bradycardia.  A failed antiarrhythmics, atrial fibrillation is now permanent Anitcoagulation with Apixoban    Syncope was associated with profound bradycardia; monitoring demonstrated subsequent control of atrial fibrillation rates>> pacemaker implantation  Medtronic 9/16   Interval cardiomyopathy initially improved and then with gradual deterioration.  Concerns have been related to ventricular pacing so backup bradycardia pacing rates were decreased 6/23.  Now 3.3 % previously intolerant but no tolerating ACE inhibitor's  The patient denies chest pain, shortness of breath, nocturnal dyspnea, orthopnea or peripheral edema.  There have been no palpitations, lightheadedness or syncope.    Energy level less but otherwise okay       Date Cr K Mg Hgb     5/17  0.8 4.3      4/18  0.94 4.4 2.1 13.7   9/18 0.84 4.5 2.3    3/19 0.85 4.5 2.3 14.4   5/19 0.8      11/20 0.8 4.5 2.1    7/21 0.9 4.5  15.0 (8/21)   6/23 0.97 4.8     5/24 0.8 4.3  14 (11/23)    DATE TEST EF   2/15 Echo   35-40 %   5/16 Echo   55-60 %   10/21 Echo  50-55%   5/23 Echo  30-35%   9/23 Echo  25-30%   .   Thromboembolic risk factors ( age  -2, CHF-1) for a CHADSVASc Score of >=3   Past Medical History:  Diagnosis Date   Dysrhythmia    PAF, VT   Hx of dysplastic nevus 10/17/2008   Lower back. Slight to moderate atypia.   Persistent atrial fibrillation (HCC)    a. 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 s/p MDT PPM   Syncope    Ventricular tachycardia (paroxysmal)     Past Surgical History:  Procedure  Laterality Date   BROW PTOSIS Bilateral 06/01/2018   Procedure: BROW PTOSIS;  Surgeon: Imagene Riches, MD;  Location: [facility];  Service: Ophthalmology;  Laterality: Bilateral;   ELECTROPHYSIOLOGY STUDY  01-08-14   negative EPS by Dr Ladona Ridgel following syncopal episode and documented WCT   ELECTROPHYSIOLOGY STUDY N/A 01/09/2014   Procedure: ELECTROPHYSIOLOGY STUDY +/- ICD, +/- ILR;  Surgeon: Marinus Maw, MD;  Location: [facility] CATH LAB;  Service: Cardiovascular;  Laterality: N/A;   EP IMPLANTABLE DEVICE N/A 08/08/2015   Procedure: Pacemaker Implant;  Surgeon: [name], MD;  Location: [facility] INVASIVE CV LAB;  Service: Cardiovascular;  Laterality: N/A;   EP IMPLANTABLE DEVICE N/A 08/08/2015   Procedure: Loop Recorder Removal;  Surgeon: [name], MD;  Location: [facility] INVASIVE CV LAB;  Service: Cardiovascular;  Laterality: N/A;   FINGER SURGERY     Noted the only time patient has been admitted overnight.     LEFT HEART CATHETERIZATION WITH CORONARY ANGIOGRAM N/A 01/06/2014   Procedure: LEFT HEART CATHETERIZATION WITH CORONARY ANGIOGRAM;  Surgeon: Marykay Lex, MD;  Location: [facility] CATH LAB;  Service: Cardiovascular;  Laterality: N/A;   LOOP RECORDER IMPLANT  01-08-14   MDT LinQ implanted by Dr Ladona Ridgel for syncope and WCT   PTOSIS REPAIR Bilateral 06/01/2018   Procedure: BLEPHAROPTOSIS REPAIR RESECT;  Surgeon: Imagene Riches, MD;  Location: [facility];  Service: Ophthalmology;  Laterality: Bilateral;    Current Outpatient Medications  Medication Sig Dispense Refill   apixaban (ELIQUIS) 5 MG TABS tablet Take 1 tablet (5 mg total) by mouth 2 (two) times daily. 180 tablet 1   Cholecalciferol (VITAMIN D3) 50 MCG (2000 UT) capsule Take 2,000 Units by mouth daily.     Cyanocobalamin (B-12 PO) Take 400 mg by mouth. 1 capsule daily     diltiazem (CARDIZEM CD) 120 MG 24 hr capsule Take 1 capsule (120 mg total) by mouth daily. 90 capsule 3   losartan (COZAAR) 25 MG tablet Take 1 tablet (25 mg  total) by mouth at bedtime. 90 tablet 3   mometasone (ELOCON) 0.1 % cream Apply topically as needed.     Multiple Vitamins-Minerals (CENTRUM SILVER PO) Take by mouth. 1 per day     Multiple Vitamins-Minerals (PRESERVISION AREDS) TABS Take 2 tablets by mouth daily.     pravastatin (PRAVACHOL) 20 MG tablet Take 20 mg by mouth daily.     timolol (TIMOPTIC) 0.5 % ophthalmic solution Place 1 drop into the right eye at bedtime.     traZODone (DESYREL) 100 MG tablet Take 100 mg by mouth at bedtime. Pt takes 2 tablet every night     No current facility-administered medications for this visit.  Should have treatment  No Known Allergies  Review of Systems negative except from HPI and PMH  Physical Exam  BP 118/60   Pulse 73   Ht 6\' 4"  (1.93 m)   Wt 205 lb 6.4 oz (93.2 kg)   SpO2 99%   BMI 25.00 kg/m   Well developed and well nourished in no acute distress HENT normal Neck supple with JVP-flat Clear Device pocket well healed; without hematoma or erythema.  There is no tethering  Irregularly irregular rate and rhythm Abd-soft with active BS No Clubbing cyanosis   edema Skin-warm and dry A & Oriented  Grossly normal sensory and motor function  ECG atrial fibrillation at 73 Intervals-/13/38 IVCD     Assessment and  Plan  Syncope.   Atrial fibrillation permanent  Wide QRS tachycardia   Junctional tachycardia  PVCs  LBBB infer axis, late intrinsicoid deflection   Congestive heart failure-chronic systolic  Nonischemic cardiomyopathy resolved now recurrent  IVCD  Pacemaker-Medtronic     Will try again spiro for his cardiomyopathy, he thinks that it was that drug as opposed to the digoxin that caused the fatigue last summer.  Will try it and see.  His ejection fraction certainly would benefit from GDMT.  He has not been tolerant of beta-blockers in the past either but if we get him on the spiro may try his bisoprolol as opposed to the diltiazem.  Continue the  losartan.  Atrial fibrillation permanent with reasonably controlled rate.  No bleeding.  Will continue on the Eliquis.  No interval syncope

## 2023-03-27 NOTE — Patient Instructions (Signed)
Medication Instructions:  Your physician has recommended you make the following change in your medication:   ** Begin Spironolactone 25mg - 1/2 tablet by mouth daily.  *If you need a refill on your cardiac medications before your next appointment, please call your pharmacy*   Lab Work: BMET in 2 weeks If you have labs (blood work) drawn today and your tests are completely normal, you will receive your results only by: MyChart Message (if you have MyChart) OR A paper copy in the mail If you have any lab test that is abnormal or we need to change your treatment, we will call you to review the results.   Testing/Procedures: None ordered.    Follow-Up: At [facility], you and your health needs are our priority.  As part of our continuing mission to provide you with exceptional heart care, we have created designated Provider Care Teams.  These Care Teams include your primary Cardiologist (physician) and Advanced Practice Providers (APPs -  Physician Assistants and Nurse Practitioners) who all work together to provide you with the care you need, when you need it.  We recommend signing up for the patient portal called "MyChart".  Sign up information is provided on this After Visit Summary.  MyChart is used to connect with patients for Virtual Visits (Telemedicine).  Patients are able to view lab/test results, encounter notes, upcoming appointments, etc.  Non-urgent messages can be sent to your provider as well.   To learn more about what you can do with MyChart, go to [email or web address].    Your next appointment:   6 months with Dr Klein 

## 2023-03-30 NOTE — Addendum Note (Signed)
Addended by: Mariam Dollar on: 03/30/2023 09:54 AM   Modules accepted: Orders

## 2023-04-09 ENCOUNTER — Ambulatory Visit: Payer: Medicare Other | Attending: Internal Medicine

## 2023-04-09 DIAGNOSIS — Z79899 Other long term (current) drug therapy: Secondary | ICD-10-CM

## 2023-04-09 DIAGNOSIS — I5032 Chronic diastolic (congestive) heart failure: Secondary | ICD-10-CM

## 2023-04-09 DIAGNOSIS — R55 Syncope and collapse: Secondary | ICD-10-CM

## 2023-04-09 DIAGNOSIS — R000 Tachycardia, unspecified: Secondary | ICD-10-CM

## 2023-04-09 DIAGNOSIS — I472 Ventricular tachycardia, unspecified: Secondary | ICD-10-CM

## 2023-04-09 DIAGNOSIS — I493 Ventricular premature depolarization: Secondary | ICD-10-CM

## 2023-04-09 DIAGNOSIS — I428 Other cardiomyopathies: Secondary | ICD-10-CM

## 2023-04-09 DIAGNOSIS — I4821 Permanent atrial fibrillation: Secondary | ICD-10-CM

## 2023-04-09 DIAGNOSIS — Z959 Presence of cardiac and vascular implant and graft, unspecified: Secondary | ICD-10-CM

## 2023-04-10 LAB — BASIC METABOLIC PANEL
BUN/Creatinine Ratio: 27 — ABNORMAL HIGH (ref 10–24)
BUN: 27 mg/dL (ref 8–27)
CO2: 19 mmol/L — ABNORMAL LOW (ref 20–29)
Calcium: 8.7 mg/dL (ref 8.6–10.2)
Chloride: 104 mmol/L (ref 96–106)
Creatinine, Ser: 1.01 mg/dL (ref 0.76–1.27)
Glucose: 159 mg/dL — ABNORMAL HIGH (ref 70–99)
Potassium: 4.6 mmol/L (ref 3.5–5.2)
Sodium: 138 mmol/L (ref 134–144)
eGFR: 74 mL/min/{1.73_m2} (ref >59)

## 2023-04-14 ENCOUNTER — Encounter: Payer: Self-pay | Admitting: Internal Medicine

## 2023-05-04 ENCOUNTER — Ambulatory Visit: Payer: Medicare Other | Admitting: Dermatology

## 2023-07-02 ENCOUNTER — Ambulatory Visit (INDEPENDENT_AMBULATORY_CARE_PROVIDER_SITE_OTHER): Payer: Medicare Other

## 2023-07-02 DIAGNOSIS — I428 Other cardiomyopathies: Secondary | ICD-10-CM | POA: Diagnosis not present

## 2023-07-02 LAB — CUP PACEART REMOTE DEVICE CHECK
Battery Remaining Longevity: 19 mo
Battery Voltage: 2.91 V
Brady Statistic RA Percent Paced: 0 %
Brady Statistic RV Percent Paced: 3.84 %
Date Time Interrogation Session: 20240815094905
Implantable Lead Connection Status: 753985
Implantable Lead Connection Status: 753985
Implantable Lead Implant Date: 20160921
Implantable Lead Implant Date: 20160921
Implantable Lead Location: 753859
Implantable Lead Location: 753860
Implantable Lead Model: 5076
Implantable Lead Model: 5076
Implantable Pulse Generator Implant Date: 20160921
Lead Channel Impedance Value: 285 Ohm
Lead Channel Impedance Value: 304 Ohm
Lead Channel Impedance Value: 361 Ohm
Lead Channel Impedance Value: 380 Ohm
Lead Channel Pacing Threshold Amplitude: 0.375 V
Lead Channel Pacing Threshold Amplitude: 1.25 V
Lead Channel Pacing Threshold Pulse Width: 0.4 ms
Lead Channel Pacing Threshold Pulse Width: 0.4 ms
Lead Channel Sensing Intrinsic Amplitude: 0.625 mV
Lead Channel Sensing Intrinsic Amplitude: 0.625 mV
Lead Channel Sensing Intrinsic Amplitude: 6.5 mV
Lead Channel Sensing Intrinsic Amplitude: 6.5 mV
Lead Channel Setting Pacing Amplitude: 2.5 V
Lead Channel Setting Pacing Pulse Width: 0.4 ms
Lead Channel Setting Sensing Sensitivity: 2.8 mV
Zone Setting Status: 755011
Zone Setting Status: 755011

## 2023-07-06 ENCOUNTER — Telehealth: Payer: Self-pay | Admitting: Internal Medicine

## 2023-07-06 MED ORDER — LOSARTAN POTASSIUM 25 MG PO TABS: 25.0000 mg | ORAL_TABLET | Freq: Every day | ORAL | 2 refills | Status: DC

## 2023-07-06 NOTE — Telephone Encounter (Signed)
*  STAT* If patient is at the pharmacy, call can be transferred to refill team.   1. Which medications need to be refilled? (please list name of each medication and dose if known)   Disp Refills Start End   losartan (COZAAR) 25 MG tablet          2. Would you like to learn more about the convenience, safety, & potential cost savings by using the [facility]?    3. Are you open to using the [facility] (Type [facility].  ).   4. Which pharmacy/location (including street and city if local pharmacy) is medication to be sent to? [id number] - [place], [place] - [address]    5. Do they need a 30 day or 90 day supply? 90 day

## 2023-07-06 NOTE — Telephone Encounter (Signed)
Pt's medication was sent to pt's pharmacy as requested. Confirmation received.  °

## 2023-07-14 NOTE — Progress Notes (Signed)
Remote pacemaker transmission.   

## 2023-09-22 ENCOUNTER — Telehealth: Payer: Self-pay | Admitting: Internal Medicine

## 2023-09-22 DIAGNOSIS — I4821 Permanent atrial fibrillation: Secondary | ICD-10-CM

## 2023-09-22 MED ORDER — APIXABAN 5 MG PO TABS
5.0000 mg | ORAL_TABLET | Freq: Two times a day (BID) | ORAL | 1 refills | Status: DC
Start: 2023-09-22 — End: 2023-12-28

## 2023-09-22 NOTE — Telephone Encounter (Signed)
Prescription refill request for Eliquis received. Indication: Afib  Last office visit: 03/27/23  Grant Moreno)  Scr: 1.01 (04/09/23)  Age: 82 Weight: 93.2kg  Appropriate dose. Refill sent.

## 2023-09-22 NOTE — Telephone Encounter (Signed)
*  STAT* If patient is at the pharmacy, call can be transferred to refill team.   1. Which medications need to be refilled? (please list name of each medication and dose if known)   apixaban (ELIQUIS) 5 MG TABS tablet   2. Would you like to learn more about the convenience, safety, & potential cost savings by using the [facility]?   3. Are you open to using the [facility] (Type [facility]. ).  4. Which pharmacy/location (including street and city if local pharmacy) is medication to be sent to?  [id number] - [place], [place] - [address] OF [address]    5. Do they need a 30 day or 90 day supply? 90 day  Patient stated he still has some medication left.

## 2023-10-01 ENCOUNTER — Ambulatory Visit (INDEPENDENT_AMBULATORY_CARE_PROVIDER_SITE_OTHER): Payer: Medicare Other

## 2023-10-01 DIAGNOSIS — I428 Other cardiomyopathies: Secondary | ICD-10-CM | POA: Diagnosis not present

## 2023-10-01 LAB — CUP PACEART REMOTE DEVICE CHECK
Battery Remaining Longevity: 18 mo
Battery Voltage: 2.91 V
Brady Statistic RA Percent Paced: 0 %
Brady Statistic RV Percent Paced: 5.44 %
Date Time Interrogation Session: 20241114085233
Implantable Lead Connection Status: 753985
Implantable Lead Connection Status: 753985
Implantable Lead Implant Date: 20160921
Implantable Lead Implant Date: 20160921
Implantable Lead Location: 753859
Implantable Lead Location: 753860
Implantable Lead Model: 5076
Implantable Lead Model: 5076
Implantable Pulse Generator Implant Date: 20160921
Lead Channel Impedance Value: 304 Ohm
Lead Channel Impedance Value: 304 Ohm
Lead Channel Impedance Value: 361 Ohm
Lead Channel Impedance Value: 380 Ohm
Lead Channel Pacing Threshold Amplitude: 0.375 V
Lead Channel Pacing Threshold Amplitude: 1.25 V
Lead Channel Pacing Threshold Pulse Width: 0.4 ms
Lead Channel Pacing Threshold Pulse Width: 0.4 ms
Lead Channel Sensing Intrinsic Amplitude: 0.625 mV
Lead Channel Sensing Intrinsic Amplitude: 0.625 mV
Lead Channel Sensing Intrinsic Amplitude: 5.25 mV
Lead Channel Sensing Intrinsic Amplitude: 5.25 mV
Lead Channel Setting Pacing Amplitude: 2.5 V
Lead Channel Setting Pacing Pulse Width: 0.4 ms
Lead Channel Setting Sensing Sensitivity: 2.8 mV
Zone Setting Status: 755011
Zone Setting Status: 755011

## 2023-10-14 ENCOUNTER — Ambulatory Visit: Payer: Medicare Other | Attending: Internal Medicine | Admitting: Internal Medicine

## 2023-10-14 ENCOUNTER — Encounter: Payer: Self-pay | Admitting: Internal Medicine

## 2023-10-14 VITALS — BP 130/76 | HR 65 | Ht 76.0 in | Wt 206.6 lb

## 2023-10-14 DIAGNOSIS — I493 Ventricular premature depolarization: Secondary | ICD-10-CM

## 2023-10-14 DIAGNOSIS — I5022 Chronic systolic (congestive) heart failure: Secondary | ICD-10-CM | POA: Diagnosis not present

## 2023-10-14 DIAGNOSIS — I472 Ventricular tachycardia, unspecified: Secondary | ICD-10-CM | POA: Diagnosis not present

## 2023-10-14 DIAGNOSIS — I428 Other cardiomyopathies: Secondary | ICD-10-CM

## 2023-10-14 DIAGNOSIS — I4821 Permanent atrial fibrillation: Secondary | ICD-10-CM | POA: Diagnosis not present

## 2023-10-14 DIAGNOSIS — R000 Tachycardia, unspecified: Secondary | ICD-10-CM

## 2023-10-14 LAB — CUP PACEART INCLINIC DEVICE CHECK
Date Time Interrogation Session: 20241127161405
Implantable Lead Connection Status: 753985
Implantable Lead Connection Status: 753985
Implantable Lead Implant Date: 20160921
Implantable Lead Implant Date: 20160921
Implantable Lead Location: 753859
Implantable Lead Location: 753860
Implantable Lead Model: 5076
Implantable Lead Model: 5076
Implantable Pulse Generator Implant Date: 20160921

## 2023-10-14 MED ORDER — BISOPROLOL FUMARATE 5 MG PO TABS: 2.5000 mg | ORAL_TABLET | Freq: Every day | ORAL | 1 refills | Status: DC

## 2023-10-14 NOTE — Patient Instructions (Signed)
Medication Instructions:  Your physician has recommended you make the following change in your medication:   ** Stop Diltiazem  ** Start Bisoprolol 5mg  - 1/2 tablet by mouth daily  *If you need a refill on your cardiac medications before your next appointment, please call your pharmacy*   Lab Work: None ordered.  If you have labs (blood work) drawn today and your tests are completely normal, you will receive your results only by: MyChart Message (if you have MyChart) OR A paper copy in the mail If you have any lab test that is abnormal or we need to change your treatment, we will call you to review the results.   Testing/Procedures: None ordered.    Follow-Up: At [facility], you and your health needs are our priority.  As part of our continuing mission to provide you with exceptional heart care, we have created designated Provider Care Teams.  These Care Teams include your primary Cardiologist (physician) and Advanced Practice Providers (APPs -  Physician Assistants and Nurse Practitioners) who all work together to provide you with the care you need, when you need it.  We recommend signing up for the patient portal called "MyChart".  Sign up information is provided on this After Visit Summary.  MyChart is used to connect with patients for Virtual Visits (Telemedicine).  Patients are able to view lab/test results, encounter notes, upcoming appointments, etc.  Non-urgent messages can be sent to your provider as well.   To learn more about what you can do with MyChart, go to [email or web address].    Your next appointment:   6 months with Dr Graciela Husbands in [place]

## 2023-10-14 NOTE — Progress Notes (Signed)
Patient Care Team: [name], MD as PCP - General (Internal Medicine) [name], MD as PCP - Cardiology (Cardiology)   HPI  Grant Ro. is a 82 y.o. male seen in follow-up for pacemaker-Medtronic (DOI 9/16) implanted for bradycardia and what is now the setting of permanent atrial fibrillation, as well as syncope with Wide QRS Tachycardia felt to be VT , noninducibility at EPS with LV dysfunction; cath normal CA 2/15; interval  Afib & RVR cx by sinus bradycardia.  Failed antiarrhythmics,>>  atrial fibrillation is now permanent Anitcoagulation with Apixoban  no bleeding  Interval cardiomyopathy initially improved and then with gradual deterioration.  Concerns have been related to ventricular pacing so backup bradycardia pacing rates were decreased 6/23.  Now 3.3 % previously intolerant but no tolerating ACE inhibitor's  The patient denies chest pain, shortness of breath, nocturnal dyspnea, orthopnea or peripheral edema.  There have been no palpitations, lightheadedness or syncope.  Complains of fatigue.    In the past he has been intolerant of Entresto, spironolactone, there is also some question about digoxin.  He has not been on an SGLT2  Date Cr K Mg Hgb     5/17  0.8 4.3      4/18  0.94 4.4 2.1 13.7   9/18 0.84 4.5 2.3    3/19 0.85 4.5 2.3 14.4   5/19 0.8      11/20 0.8 4.5 2.1    7/21 0.9 4.5  15.0 (8/21)   6/23 0.97 4.8     5/24 0.8 4.3  14 (11/23)           DATE TEST EF   2/15 Echo   35-40 %   5/16 Echo   55-60 %   10/21 Echo  50-55%   5/23 Echo  30-35%   9/23 Echo  25-30%   .   Thromboembolic risk factors ( age  -2, CHF-1) for a CHADSVASc Score of >=3   Past Medical History:  Diagnosis Date   Dysrhythmia    PAF, VT   Hx of dysplastic nevus 10/17/2008   Lower back. Slight to moderate atypia.   Persistent atrial fibrillation (HCC)    a. 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 s/p MDT  PPM   Syncope    Ventricular tachycardia (paroxysmal)     Past Surgical History:  Procedure Laterality Date   BROW PTOSIS Bilateral 06/01/2018   Procedure: BROW PTOSIS;  Surgeon: Imagene Riches, MD;  Location: [facility];  Service: Ophthalmology;  Laterality: Bilateral;   ELECTROPHYSIOLOGY STUDY  01-08-14   negative EPS by Dr Ladona Ridgel following syncopal episode and documented WCT   ELECTROPHYSIOLOGY STUDY N/A 01/09/2014   Procedure: ELECTROPHYSIOLOGY STUDY +/- ICD, +/- ILR;  Surgeon: Marinus Maw, MD;  Location: [facility] CATH LAB;  Service: Cardiovascular;  Laterality: N/A;   EP IMPLANTABLE DEVICE N/A 08/08/2015   Procedure: Pacemaker Implant;  Surgeon: [name], MD;  Location: [facility] INVASIVE CV LAB;  Service: Cardiovascular;  Laterality: N/A;   EP IMPLANTABLE DEVICE N/A 08/08/2015   Procedure: Loop Recorder Removal;  Surgeon: [name], MD;  Location: [facility] INVASIVE CV LAB;  Service: Cardiovascular;  Laterality: N/A;   FINGER SURGERY     Noted the only time patient has been admitted overnight.     LEFT HEART CATHETERIZATION WITH CORONARY ANGIOGRAM N/A 01/06/2014   Procedure: LEFT HEART CATHETERIZATION  WITH CORONARY ANGIOGRAM;  Surgeon: Marykay Lex, MD;  Location: [facility] CATH LAB;  Service: Cardiovascular;  Laterality: N/A;   LOOP RECORDER IMPLANT  01-08-14   MDT LinQ implanted by Dr Ladona Ridgel for syncope and WCT   PTOSIS REPAIR Bilateral 06/01/2018   Procedure: BLEPHAROPTOSIS REPAIR RESECT;  Surgeon: Imagene Riches, MD;  Location: [facility];  Service: Ophthalmology;  Laterality: Bilateral;    Current Outpatient Medications  Medication Sig Dispense Refill   apixaban (ELIQUIS) 5 MG TABS tablet Take 1 tablet (5 mg total) by mouth 2 (two) times daily. 180 tablet 1   Cholecalciferol (VITAMIN D3) 50 MCG (2000 UT) capsule Take 2,000 Units by mouth daily.     losartan (COZAAR) 25 MG tablet Take 1 tablet (25 mg total) by mouth at bedtime. 90 tablet 2   mometasone (ELOCON) 0.1 % cream  Apply topically as needed.     Multiple Vitamins-Minerals (CENTRUM SILVER PO) Take by mouth. 1 per day     Multiple Vitamins-Minerals (PRESERVISION AREDS) TABS Take 2 tablets by mouth daily.     pravastatin (PRAVACHOL) 20 MG tablet Take 20 mg by mouth daily.     timolol (TIMOPTIC) 0.5 % ophthalmic solution Place 1 drop into the right eye at bedtime.     traZODone (DESYREL) 100 MG tablet Take 100 mg by mouth at bedtime. Pt takes 2 tablet every night     No current facility-administered medications for this visit.  Should have treatment  No Known Allergies  Review of Systems negative except from HPI and PMH  Physical Exam  BP 130/76   Pulse 65   Ht 6\' 4"  (1.93 m)   Wt 206 lb 9.6 oz (93.7 kg)   SpO2 99%   BMI 25.15 kg/m  Well developed and well nourished in no acute distress HENT normal Neck supple with JVP-flat Clear Device pocket well healed; without hematoma or erythema.  There is no tethering  Regular rate and rhythm, no  gallop No murmur Abd-soft with active BS No Clubbing cyanosis  edema Skin-warm and dry A & Oriented  Grossly normal sensory and motor function  ECG atrial fibrillation 65 beats intervals-/14/40 Nonspecific IVCD  Device function is normal. Programming changes none  See Paceart for details      Assessment and  Plan  Syncope.   Atrial fibrillation permanent  Wide QRS tachycardia   Junctional tachycardia  PVCs  LBBB infer axis, late intrinsicoid deflection   Congestive heart failure-chronic systolic  Nonischemic cardiomyopathy resolved now recurrent  IVCD  Pacemaker-Medtronic   Intolerance of Entresto, spironolactone  No interval syncope.  Has been in the past related to low blood pressure.  He is on diltiazem with his atrial fibrillation with reasonable rate control.  With his left ventricular dysfunction, would like to try again to get him off of it.  Will stop the diltiazem and start him on bisoprolol 2.5 mg daily.  Will give him a  5-day interlude so he has a new baseline of symptoms.  He is euvolemic.  Will continue the losartan and Lasix.

## 2023-10-14 NOTE — Progress Notes (Signed)
Remote pacemaker transmission.   

## 2023-10-26 ENCOUNTER — Encounter: Payer: Self-pay | Admitting: Dermatology

## 2023-10-26 ENCOUNTER — Ambulatory Visit: Payer: Medicare Other | Admitting: Dermatology

## 2023-10-26 DIAGNOSIS — W908XXA Exposure to other nonionizing radiation, initial encounter: Secondary | ICD-10-CM

## 2023-10-26 DIAGNOSIS — L814 Other melanin hyperpigmentation: Secondary | ICD-10-CM

## 2023-10-26 DIAGNOSIS — L570 Actinic keratosis: Secondary | ICD-10-CM | POA: Diagnosis not present

## 2023-10-26 DIAGNOSIS — Z1283 Encounter for screening for malignant neoplasm of skin: Secondary | ICD-10-CM | POA: Diagnosis not present

## 2023-10-26 DIAGNOSIS — D229 Melanocytic nevi, unspecified: Secondary | ICD-10-CM

## 2023-10-26 DIAGNOSIS — L578 Other skin changes due to chronic exposure to nonionizing radiation: Secondary | ICD-10-CM

## 2023-10-26 DIAGNOSIS — Z872 Personal history of diseases of the skin and subcutaneous tissue: Secondary | ICD-10-CM

## 2023-10-26 DIAGNOSIS — L729 Follicular cyst of the skin and subcutaneous tissue, unspecified: Secondary | ICD-10-CM

## 2023-10-26 DIAGNOSIS — D692 Other nonthrombocytopenic purpura: Secondary | ICD-10-CM

## 2023-10-26 DIAGNOSIS — Z86018 Personal history of other benign neoplasm: Secondary | ICD-10-CM

## 2023-10-26 DIAGNOSIS — L821 Other seborrheic keratosis: Secondary | ICD-10-CM

## 2023-10-26 DIAGNOSIS — D1801 Hemangioma of skin and subcutaneous tissue: Secondary | ICD-10-CM

## 2023-10-26 DIAGNOSIS — L720 Epidermal cyst: Secondary | ICD-10-CM

## 2023-10-26 NOTE — Patient Instructions (Addendum)

## 2023-10-26 NOTE — Progress Notes (Signed)
Follow-Up Visit   Subjective  Grant Moreno. is a 82 y.o. male who presents for the following: Skin Cancer Screening and Upper Body Skin Exam hx of Dysplastic nevus, hx of AKs  The patient presents for Upper Body Skin Exam (UBSE) for skin cancer screening and mole check. The patient has spots, moles and lesions to be evaluated, some may be new or changing and the patient may have concern these could be cancer.    The following portions of the chart were reviewed this encounter and updated as appropriate: medications, allergies, medical history  Review of Systems:  No other skin or systemic complaints except as noted in HPI or Assessment and Plan.  Objective  Well appearing patient in no apparent distress; mood and affect are within normal limits.  All skin waist up examined. Relevant physical exam findings are noted in the Assessment and Plan.  Left Ear helix x 2, L anti helix x 1, R ear helix x 1, vertex scalp x 7, R temple  x 1, L temple x 2, L forehead x 3, R hand dorsum x 1, L hand dorsum x 1 (19) Pink scaly macules    Assessment & Plan   AK (actinic keratosis) (19) Left Ear helix x 2, L anti helix x 1, R ear helix x 1, vertex scalp x 7, R temple  x 1, L temple x 2, L forehead x 3, R hand dorsum x 1, L hand dorsum x 1  Actinic keratoses are precancerous spots that appear secondary to cumulative UV radiation exposure/sun exposure over time. They are chronic with expected duration over 1 year. A portion of actinic keratoses will progress to squamous cell carcinoma of the skin. It is not possible to reliably predict which spots will progress to skin cancer and so treatment is recommended to prevent development of skin cancer.  Recommend daily broad spectrum sunscreen SPF 30+ to sun-exposed areas, reapply every 2 hours as needed.  Recommend staying in the shade or wearing long sleeves, sun glasses (UVA+UVB protection) and wide brim hats (4-inch brim around the entire circumference  of the hat). Call for new or changing lesions.  Destruction of lesion - Left Ear helix x 2, L anti helix x 1, R ear helix x 1, vertex scalp x 7, R temple  x 1, L temple x 2, L forehead x 3, R hand dorsum x 1, L hand dorsum x 1 (19)  Destruction method: cryotherapy   Informed consent: discussed and consent obtained   Lesion destroyed using liquid nitrogen: Yes   Region frozen until ice ball extended beyond lesion: Yes   Outcome: patient tolerated procedure well with no complications   Post-procedure details: wound care instructions given   Additional details:  Prior to procedure, discussed risks of blister formation, small wound, skin dyspigmentation, or rare scar following cryotherapy. Recommend Vaseline ointment to treated areas while healing.    Skin cancer screening performed today.  Actinic Damage - Chronic condition, secondary to cumulative UV/sun exposure - diffuse scaly erythematous macules with underlying dyspigmentation - Recommend daily broad spectrum sunscreen SPF 30+ to sun-exposed areas, reapply every 2 hours as needed.  - Staying in the shade or wearing long sleeves, sun glasses (UVA+UVB protection) and wide brim hats (4-inch brim around the entire circumference of the hat) are also recommended for sun protection.  - Call for new or changing lesions.  Lentigines, Seborrheic Keratoses, Hemangiomas - Benign normal skin lesions - Benign-appearing - Call for any changes  Melanocytic Nevi - Tan-brown and/or pink-flesh-colored symmetric macules and papules - Benign appearing on exam today - Observation - Call clinic for new or changing moles - Recommend daily use of broad spectrum spf 30+ sunscreen to sun-exposed areas.   HISTORY OF DYSPLASTIC NEVUS No evidence of recurrence today Recommend regular full body skin exams Recommend daily broad spectrum sunscreen SPF 30+ to sun-exposed areas, reapply every 2 hours as needed.  Call if any new or changing lesions are noted  between office visits  - Lower back  Purpura - Chronic; persistent and recurrent.  Treatable, but not curable. - Violaceous macules and patches - Benign - Related to trauma, age, sun damage and/or use of blood thinners, chronic use of topical and/or oral steroids - Observe - Can use OTC arnica containing moisturizer such as Dermend Bruise Formula if desired - Call for worsening or other concerns  EPIDERMAL INCLUSION CYST Exam: Subcutaneous nodule at back  Benign-appearing. Exam most consistent with an epidermal inclusion cyst. Discussed that a cyst is a benign growth that can grow over time and sometimes get irritated or inflamed. Recommend observation if it is not bothersome. Discussed option of surgical excision to remove it if it is growing, symptomatic, or other changes noted. Please call for new or changing lesions so they can be evaluated.    Return in about 1 year (around 10/25/2024) for UBSE, Hx of Dysplastic nevi, Hx of AKs.  I, Ardis Rowan, RMA, am acting as scribe for Willeen Niece, MD .   Documentation: I have reviewed the above documentation for accuracy and completeness, and I agree with the above.  Willeen Niece, MD

## 2023-12-01 ENCOUNTER — Ambulatory Visit: Payer: Medicare Other | Admitting: Dermatology

## 2023-12-28 ENCOUNTER — Telehealth: Payer: Self-pay | Admitting: Internal Medicine

## 2023-12-28 DIAGNOSIS — I4821 Permanent atrial fibrillation: Secondary | ICD-10-CM

## 2023-12-28 MED ORDER — BISOPROLOL FUMARATE 5 MG PO TABS: 2.5000 mg | ORAL_TABLET | Freq: Every day | ORAL | 2 refills | Status: DC

## 2023-12-28 MED ORDER — LOSARTAN POTASSIUM 25 MG PO TABS: 25.0000 mg | ORAL_TABLET | Freq: Every day | ORAL | 2 refills | Status: DC

## 2023-12-28 MED ORDER — APIXABAN 5 MG PO TABS: 5.0000 mg | ORAL_TABLET | Freq: Two times a day (BID) | ORAL | 1 refills | Status: DC

## 2023-12-28 NOTE — Addendum Note (Signed)
 Addended by: Lia Rede on: 12/28/2023 01:39 PM   Modules accepted: Orders

## 2023-12-28 NOTE — Telephone Encounter (Signed)
 Prescription refill request for Eliquis  received. Indication: AF Last office visit: 10/14/23  Doyle Generous MD Scr 1.0 on 12/23/23  Epic Age:  83 Weight: 93.7kg  Based on above findings Eliquis  5mg  twice daily is the appropriate dose.  Refill approved.

## 2023-12-28 NOTE — Telephone Encounter (Signed)
 Requested Prescriptions   Signed Prescriptions Disp Refills   bisoprolol  (ZEBETA ) 5 MG tablet 45 tablet 2    Sig: Take 0.5 tablets (2.5 mg total) by mouth daily.    Authorizing Provider: KLEIN, STEVEN C    Ordering User: Grier Leber, Chassidy Layson  C   losartan  (COZAAR ) 25 MG tablet 90 tablet 2    Sig: Take 1 tablet (25 mg total) by mouth at bedtime.    Authorizing Provider: KLEIN, STEVEN C    Ordering User: Jami Mcclintock

## 2023-12-28 NOTE — Telephone Encounter (Signed)
*  STAT* If patient is at the pharmacy, call can be transferred to refill team.   1. Which medications need to be refilled? (please list name of each medication and dose if known)  apixaban  (ELIQUIS ) 5 MG TABS tablet  bisoprolol  (ZEBETA ) 5 MG tablet  losartan  (COZAAR ) 25 MG tablet   2. Which pharmacy/location (including street and city if local pharmacy) is medication to be sent to? [id number] - [place], [place] - [address]   3. Do they need a 30 day or 90 day supply? 90 day   Patient would like more than 1 refill on the prescription - he would like a year supply called in. Appt made for Rodolfo Clan on 03/24/24.

## 2023-12-28 NOTE — Telephone Encounter (Signed)
 Refill Request.

## 2023-12-31 ENCOUNTER — Ambulatory Visit (INDEPENDENT_AMBULATORY_CARE_PROVIDER_SITE_OTHER): Payer: Medicare Other

## 2023-12-31 DIAGNOSIS — I428 Other cardiomyopathies: Secondary | ICD-10-CM | POA: Diagnosis not present

## 2024-01-04 LAB — CUP PACEART REMOTE DEVICE CHECK
Battery Remaining Longevity: 14 mo
Battery Voltage: 2.9 V
Brady Statistic RA Percent Paced: 0 %
Brady Statistic RV Percent Paced: 7.76 %
Date Time Interrogation Session: 20250214095517
Implantable Lead Connection Status: 753985
Implantable Lead Connection Status: 753985
Implantable Lead Implant Date: 20160921
Implantable Lead Implant Date: 20160921
Implantable Lead Location: 753859
Implantable Lead Location: 753860
Implantable Lead Model: 5076
Implantable Lead Model: 5076
Implantable Pulse Generator Implant Date: 20160921
Lead Channel Impedance Value: 285 Ohm
Lead Channel Impedance Value: 304 Ohm
Lead Channel Impedance Value: 361 Ohm
Lead Channel Impedance Value: 399 Ohm
Lead Channel Pacing Threshold Amplitude: 0.375 V
Lead Channel Pacing Threshold Amplitude: 1.125 V
Lead Channel Pacing Threshold Pulse Width: 0.4 ms
Lead Channel Pacing Threshold Pulse Width: 0.4 ms
Lead Channel Sensing Intrinsic Amplitude: 0.375 mV
Lead Channel Sensing Intrinsic Amplitude: 0.375 mV
Lead Channel Sensing Intrinsic Amplitude: 5 mV
Lead Channel Sensing Intrinsic Amplitude: 5 mV
Lead Channel Setting Pacing Amplitude: 2.5 V
Lead Channel Setting Pacing Pulse Width: 0.4 ms
Lead Channel Setting Sensing Sensitivity: 2.8 mV
Zone Setting Status: 755011
Zone Setting Status: 755011

## 2024-01-08 ENCOUNTER — Encounter: Payer: Self-pay | Admitting: Dermatology

## 2024-01-18 ENCOUNTER — Encounter: Payer: Self-pay | Admitting: Internal Medicine

## 2024-02-02 NOTE — Progress Notes (Signed)
 Remote pacemaker transmission.

## 2024-02-02 NOTE — Addendum Note (Signed)
 Addended by: Elease Etienne A on: 02/02/2024 02:02 PM   Modules accepted: Orders

## 2024-03-20 ENCOUNTER — Other Ambulatory Visit: Payer: Self-pay | Admitting: Internal Medicine

## 2024-03-24 ENCOUNTER — Ambulatory Visit: Payer: Self-pay | Admitting: Internal Medicine

## 2024-03-29 ENCOUNTER — Ambulatory Visit: Payer: Self-pay | Admitting: Cardiology

## 2024-03-31 ENCOUNTER — Ambulatory Visit (INDEPENDENT_AMBULATORY_CARE_PROVIDER_SITE_OTHER): Payer: Medicare Other

## 2024-03-31 DIAGNOSIS — I428 Other cardiomyopathies: Secondary | ICD-10-CM

## 2024-03-31 LAB — CUP PACEART REMOTE DEVICE CHECK
Battery Remaining Longevity: 11 mo
Battery Voltage: 2.88 V
Brady Statistic RA Percent Paced: 0 %
Brady Statistic RV Percent Paced: 11.39 %
Date Time Interrogation Session: 20250515074741
Implantable Lead Connection Status: 753985
Implantable Lead Connection Status: 753985
Implantable Lead Implant Date: 20160921
Implantable Lead Implant Date: 20160921
Implantable Lead Location: 753859
Implantable Lead Location: 753860
Implantable Lead Model: 5076
Implantable Lead Model: 5076
Implantable Pulse Generator Implant Date: 20160921
Lead Channel Impedance Value: 304 Ohm
Lead Channel Impedance Value: 304 Ohm
Lead Channel Impedance Value: 361 Ohm
Lead Channel Impedance Value: 361 Ohm
Lead Channel Pacing Threshold Amplitude: 0.375 V
Lead Channel Pacing Threshold Amplitude: 1.375 V
Lead Channel Pacing Threshold Pulse Width: 0.4 ms
Lead Channel Pacing Threshold Pulse Width: 0.4 ms
Lead Channel Sensing Intrinsic Amplitude: 0.75 mV
Lead Channel Sensing Intrinsic Amplitude: 0.75 mV
Lead Channel Sensing Intrinsic Amplitude: 5.375 mV
Lead Channel Sensing Intrinsic Amplitude: 5.375 mV
Lead Channel Setting Pacing Amplitude: 2.75 V
Lead Channel Setting Pacing Pulse Width: 0.4 ms
Lead Channel Setting Sensing Sensitivity: 2.8 mV
Zone Setting Status: 755011
Zone Setting Status: 755011

## 2024-04-01 ENCOUNTER — Ambulatory Visit: Payer: Self-pay | Attending: Cardiology | Admitting: Cardiology

## 2024-04-01 ENCOUNTER — Ambulatory Visit

## 2024-04-01 VITALS — BP 124/82 | HR 69 | Ht 75.0 in | Wt 202.2 lb

## 2024-04-01 DIAGNOSIS — I5022 Chronic systolic (congestive) heart failure: Secondary | ICD-10-CM

## 2024-04-01 DIAGNOSIS — I495 Sick sinus syndrome: Secondary | ICD-10-CM

## 2024-04-01 DIAGNOSIS — I4821 Permanent atrial fibrillation: Secondary | ICD-10-CM | POA: Diagnosis not present

## 2024-04-01 DIAGNOSIS — Z950 Presence of cardiac pacemaker: Secondary | ICD-10-CM

## 2024-04-01 DIAGNOSIS — I493 Ventricular premature depolarization: Secondary | ICD-10-CM

## 2024-04-01 LAB — CUP PACEART INCLINIC DEVICE CHECK
Date Time Interrogation Session: 20250516165640
Implantable Lead Connection Status: 753985
Implantable Lead Connection Status: 753985
Implantable Lead Implant Date: 20160921
Implantable Lead Implant Date: 20160921
Implantable Lead Location: 753859
Implantable Lead Location: 753860
Implantable Lead Model: 5076
Implantable Lead Model: 5076
Implantable Pulse Generator Implant Date: 20160921

## 2024-04-01 NOTE — Patient Instructions (Addendum)
 Medication Instructions:  The current medical regimen is effective;  continue present plan and medications as directed. Please refer to the Current Medication list given to you today.   *If you need a refill on your cardiac medications before your next appointment, please call your pharmacy*  Lab Work: Your provider would like for you to have following labs drawn today CBC, CMET, TSH, BNP.    If you have labs (blood work) drawn today and your tests are completely normal, you will receive your results only by: MyChart Message (if you have MyChart) OR A paper copy in the mail If you have any lab test that is abnormal or we need to change your treatment, we will call you to review the results.  Testing: ZIO XT- Long Term Monitor Instructions  Your physician has requested you wear a ZIO patch monitor for 14 days.  This is a single patch monitor. [employer] supplies one patch monitor per enrollment. Additional stickers are not available. Please do not apply patch if you will be having a Nuclear Stress Test,  Echocardiogram, Cardiac CT, MRI, or Chest Xray during the period you would be wearing the  monitor. The patch cannot be worn during these tests. You cannot remove and re-apply the  ZIO XT patch monitor.  Your ZIO patch monitor will be mailed 3 day USPS to your address on file. It may take 3-5 days  to receive your monitor after you have been enrolled.  Once you have received your monitor, please review the enclosed instructions. Your monitor  has already been registered assigning a specific monitor serial # to you.  Billing and Patient Assistance Program Information  We have supplied [employer] with any of your insurance information on file for billing purposes. [employer] offers a sliding scale Patient Assistance Program for patients that do not have  insurance, or whose insurance does not completely cover the cost of the ZIO monitor.  You must apply for the Patient Assistance Program to  qualify for this discounted rate.  To apply, please call [employer] at [phone], select option 4, select option 2, ask to apply for  Patient Assistance Program. Sanna Crystal will ask your household income, and how many people  are in your household. They will quote your out-of-pocket cost based on that information.  [employer] will also be able to set up a 84-month, interest-free payment plan if needed.  Applying the monitor   Shave hair from upper left chest.  Hold abrader disc by orange tab. Rub abrader in 40 strokes over the upper left chest as  indicated in your monitor instructions.  Clean area with 4 enclosed alcohol pads. Let dry.  Apply patch as indicated in monitor instructions. Patch will be placed under collarbone on left  side of chest with arrow pointing upward.  Rub patch adhesive wings for 2 minutes. Remove white label marked "1". Remove the white  label marked "2". Rub patch adhesive wings for 2 additional minutes.  While looking in a mirror, press and release button in center of patch. A small green light will  flash 3-4 times. This will be your only indicator that the monitor has been turned on.  Do not shower for the first 24 hours. You may shower after the first 24 hours.  Press the button if you feel a symptom. You will hear a small click. Record Date, Time and  Symptom in the Patient Logbook.  When you are ready to remove the patch, follow instructions on the last 2 pages  of Patient  Logbook. Stick patch monitor onto the last page of Patient Logbook.  Place Patient Logbook in the blue and white box. Use locking tab on box and tape box closed  securely. The blue and white box has prepaid postage on it. Please place it in the mailbox as  soon as possible. Your physician should have your test results approximately 7 days after the  monitor has been mailed back to [facility].  Call [facility] Customer Care at [phone] if you have questions regarding  your ZIO XT  patch monitor. Call them immediately if you see an orange light blinking on your  monitor.  If your monitor falls off in less than 4 days, contact our Monitor department at [phone].  If your monitor becomes loose or falls off after 4 days call [employer] at [phone] for  suggestions on securing your monitor   Follow-Up: At [facility], you and your health needs are our priority.  As part of our continuing mission to provide you with exceptional heart care, our providers are all part of one team.  This team includes your primary Cardiologist (physician) and Advanced Practice Providers or APPs (Physician Assistants and Nurse Practitioners) who all work together to provide you with the care you need, when you need it.  Your next appointment:   6 week(s)  Provider:   Richardo Chandler, MD or Adaline Holly, NP    We recommend signing up for the patient portal called "MyChart".  Sign up information is provided on this After Visit Summary.  MyChart is used to connect with patients for Virtual Visits (Telemedicine).  Patients are able to view lab/test results, encounter notes, upcoming appointments, etc.  Non-urgent messages can be sent to your provider as well.   To learn more about what you can do with MyChart, go to [email or web address].

## 2024-04-01 NOTE — Progress Notes (Signed)
 Electrophysiology Clinic Note    Date:  04/01/2024  Patient ID:  Grant Evaristo., DOB [birth year], MRN [id number] PCP:  Grant Moulding, MD  Cardiologist:  Grant Chandler, MD Electrophysiologist: None   Discussed the use of AI scribe software for clinical note transcription with the patient, who gave verbal consent to proceed.   Patient Profile    Chief Complaint: afib follow-up  History of Present Illness: Grant Bellman. is a 83 y.o. 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 Rodolfo Moreno for routine electrophysiology followup.   He last saw Dr. Rodolfo Moreno 09/2023, no recent syncope. Stopped dilt and transitioned to bisoprolol .   On follow-up today, he did not tolerate the bisoprolol  and stopped it. He had considerable dizziness with standing, no LOC or syncope. He states that he and Dr. Rodolfo Moreno have tried several medications in the past that have made him dizzy - bisoprolol  and metoprolol  among others. He has noticed increased fatigue lately with decrease energy levels. He initially thought it was d/t resp illness, but the reduced energy has persisted.   He continues to take PRN torsemide about twice a week. Does not appreciate any edema today, breathing well, good appetite.  He continues tot ake eliquis  BID, no bleeding concerns.     Arrhythmia/Device History MDT dual chamber PPM, imp 2016; dx SND   AAD -  Tikosyn  Multaq      ROS:  Please see the history of present illness. All other systems are reviewed and otherwise negative.    Physical Exam    VS:  BP 124/82 (BP Location: Left Arm, Patient Position: Sitting, Cuff Size: Normal)   Pulse 69   Ht 6\' 3"  (1.905 m)   Wt 202 lb 3.2 oz (91.7 kg)   SpO2 98%   BMI 25.27 kg/m  BMI: Body mass index is 25.27 kg/m.  Wt Readings from Last 3 Encounters:  04/01/24 202 lb 3.2 oz (91.7 kg)  10/14/23 206 lb 9.6 oz (93.7 kg)   03/27/23 205 lb 6.4 oz (93.2 kg)     GEN- The patient is well appearing, alert and oriented x 3 today.   Lungs- Clear to ausculation bilaterally, normal work of breathing.  Heart- regularly irregular rate and rhythm, no murmurs, rubs or gallops Extremities- No peripheral edema, warm, dry Skin-  device pocket well-healed, no tethering   Device interrogation done today and reviewed by myself:  Battery 11 months Lead thresholds, impedence, sensing stable  Perm AFib noted Programmed VVI 40 to limit VP Low VP 6% No tachy episodes No changes made today   Studies Reviewed   Previous EP, cardiology notes.    EKG is ordered. Personal review of EKG from today shows:    EKG Interpretation Date/Time:  Friday Apr 01 2024 13:29:03 EDT Ventricular Rate:  69 PR Interval:    QRS Duration:  126 QT Interval:  396 QTC Calculation: 424 R Axis:   -60  Text Interpretation: Afib with slow ventricular response with bigeminal PVCs Left axis deviation Confirmed by Grant Moreno [phone]) on 04/01/2024 4:21:11 PM     TTE, 07/24/2022  1. Left ventricular ejection fraction, by estimation, is 25 to 30%. The left ventricle has severely decreased function. The left ventricle demonstrates global hypokinesis. The left ventricular internal cavity size was mildly dilated. Left ventricular diastolic function could not be evaluated.   2.  Right ventricular systolic function is normal. The right ventricular size is normal. There is mildly elevated pulmonary artery systolic pressure. The estimated right ventricular systolic pressure is 36.9 mmHg.   3. Left atrial size was severely dilated.   4. Right atrial size was severely dilated.   5. The mitral valve is normal in structure. Mild to moderate mitral valve regurgitation.   6. Tricuspid valve regurgitation is moderate.   7. The aortic valve is tricuspid. Aortic valve regurgitation is mild to moderate. No aortic stenosis is present.   8. There is borderline  dilatation of the ascending aorta, measuring 39 mm.   9. The inferior vena cava is dilated in size with <50% respiratory variability, suggesting right atrial pressure of 15 mmHg.   Comparison(s): No significant change from prior study. Prior images reviewed side by side.    TTE, 04/16/2022 LVEF 30-35%  TTE, 09/06/2020 LVEF 50-55%   Assessment and Plan     #) HFrEF, previously improved LVEF #) NICM #) SND s/p PPM #) PVCs #) perm AFib #) hypercoag d/t afib #) fatigue  Increased fatigue as of late compared to 6 months ago Significant PVC burden on today's EKG compared to prior. Unable to assess overall burden via PPM He appears warm and dry on exam Unclear whether his reduced energy are d/t PPM lower rate limit of 40, electrolyte derangement iso torsemide use, or PVC burden Update CMP, Mag, CBC, BNP today 1 week monitor to eval PVC burden Continue 25mg  losartan  daily Continue 10mg  torsemide PRN           Current medicines are reviewed at length with the patient today.   The patient does not have concerns regarding his medicines.  The following changes were made today:  none  Labs/ tests ordered today include:  Orders Placed This Encounter  Procedures   CBC   Comprehensive metabolic panel with GFR   T4, free   TSH   LONG TERM MONITOR (3-14 DAYS)   EKG 12-Lead     Disposition: Follow up with Dr. Rodolfo Moreno or EP APP 6 weeks    Signed, Grant Gee, NP  04/01/24  4:21 PM  Electrophysiology [facility]

## 2024-04-02 LAB — COMPREHENSIVE METABOLIC PANEL WITH GFR
ALT: 21 IU/L (ref 0–44)
AST: 26 IU/L (ref 0–40)
Albumin: 4.2 g/dL (ref 3.7–4.7)
Alkaline Phosphatase: 91 IU/L (ref 44–121)
BUN/Creatinine Ratio: 24 (ref 10–24)
BUN: 23 mg/dL (ref 8–27)
Bilirubin Total: 0.6 mg/dL (ref 0.0–1.2)
CO2: 21 mmol/L (ref 20–29)
Calcium: 8.8 mg/dL (ref 8.6–10.2)
Chloride: 104 mmol/L (ref 96–106)
Creatinine, Ser: 0.97 mg/dL (ref 0.76–1.27)
Globulin, Total: 2.1 g/dL (ref 1.5–4.5)
Glucose: 106 mg/dL — ABNORMAL HIGH (ref 70–99)
Potassium: 4.9 mmol/L (ref 3.5–5.2)
Sodium: 139 mmol/L (ref 134–144)
Total Protein: 6.3 g/dL (ref 6.0–8.5)
eGFR: 77 mL/min/{1.73_m2} (ref >59)

## 2024-04-02 LAB — CBC
Hematocrit: 41.1 % (ref 37.5–51.0)
Hemoglobin: 13.8 g/dL (ref 13.0–17.7)
MCH: 34.1 pg — ABNORMAL HIGH (ref 26.6–33.0)
MCHC: 33.6 g/dL (ref 31.5–35.7)
MCV: 102 fL — ABNORMAL HIGH (ref 79–97)
Platelets: 177 10*3/uL (ref 150–450)
RBC: 4.05 x10E6/uL — ABNORMAL LOW (ref 4.14–5.80)
RDW: 13.1 % (ref 11.6–15.4)
WBC: 7.4 10*3/uL (ref 3.4–10.8)

## 2024-04-02 LAB — T4, FREE: Free T4: 1.15 ng/dL (ref 0.82–1.77)

## 2024-04-02 LAB — TSH: TSH: 2.79 u[IU]/mL (ref 0.450–4.500)

## 2024-04-03 ENCOUNTER — Ambulatory Visit: Payer: Self-pay | Admitting: Cardiology

## 2024-04-04 ENCOUNTER — Telehealth: Payer: Self-pay | Admitting: Cardiology

## 2024-04-04 NOTE — Telephone Encounter (Signed)
 Patient requesting to speak directly with Suzann Riddle, NP or Concha Deed, LPN if at all possible. He would like to clarify how long he needs to wear heart monitor. Please advise.

## 2024-04-04 NOTE — Telephone Encounter (Signed)
 Patient called in stating that he was told a week but the paperwork has 2 weeks. Advised that I would send message for further clarification by the provider and we would be in touch. He verbalized understanding

## 2024-04-04 NOTE — Telephone Encounter (Signed)
 Spoke with patient and advised he should wear it for 1 week. He verbalized understanding.    Riddle, Suzann, NP  P Cv Div [facility] Triage Caller: Unspecified (Today, 12:02 PM) 1 week

## 2024-04-06 ENCOUNTER — Telehealth: Payer: Self-pay | Admitting: Cardiology

## 2024-04-06 DIAGNOSIS — I4821 Permanent atrial fibrillation: Secondary | ICD-10-CM

## 2024-04-06 DIAGNOSIS — I493 Ventricular premature depolarization: Secondary | ICD-10-CM

## 2024-04-06 NOTE — Telephone Encounter (Signed)
 I discussed patient's frequent PVCs with Dr. Marven Slimmer after our OV last week.   Recommended updated ischemic eval.  With patient's perm AFib, can not obtain cardiac CTA.  NM Pet cardiac to eval.     I called patient and reviewed recommendation.    Informed Consent   Shared Decision Making/Informed Consent The risks [chest pain, shortness of breath, cardiac arrhythmias, dizziness, blood pressure fluctuations, myocardial infarction, stroke/transient ischemic attack, nausea, vomiting, allergic reaction, radiation exposure, metallic taste sensation and life-threatening complications (estimated to be 1 in 10,000)], benefits (risk stratification, diagnosing coronary artery disease, treatment guidance) and alternatives of a cardiac PET stress test were discussed in detail with Grant Moreno and he agrees to proceed.

## 2024-04-06 NOTE — Telephone Encounter (Signed)
 Sent message to [facility] CARDIAC PET pool to schedule, will monitor for scheduling.   Thanks!

## 2024-04-08 NOTE — Telephone Encounter (Signed)
 Cardiac PET scheduled for 07/03.   Thank you!

## 2024-04-13 ENCOUNTER — Telehealth: Payer: Self-pay | Admitting: Internal Medicine

## 2024-04-13 NOTE — Telephone Encounter (Signed)
 Spoke with patient. Patient was recently prescribes Jardiance by PCP and wanted to know if her could start taking prior to PET stress. Patient instructed that he would not have to hold Jardiance for upcoming test. Patient verbalizes understanding.

## 2024-04-13 NOTE — Telephone Encounter (Signed)
 Pt c/o medication issue:  1. Name of Medication: Jardiance   2. How are you currently taking this medication (dosage and times per day)? Has not started  3. Are you having a reaction (difficulty breathing--STAT)?   4. What is your medication issue? Pt would like to know if this med will need to be held prior to PET CT due to this being a new med prescribed today

## 2024-04-23 DIAGNOSIS — Z950 Presence of cardiac pacemaker: Secondary | ICD-10-CM | POA: Diagnosis not present

## 2024-04-23 DIAGNOSIS — I5022 Chronic systolic (congestive) heart failure: Secondary | ICD-10-CM

## 2024-04-23 DIAGNOSIS — I495 Sick sinus syndrome: Secondary | ICD-10-CM

## 2024-04-23 DIAGNOSIS — I4821 Permanent atrial fibrillation: Secondary | ICD-10-CM | POA: Diagnosis not present

## 2024-05-09 NOTE — Progress Notes (Signed)
 Remote pacemaker transmission.

## 2024-05-12 NOTE — Progress Notes (Signed)
 Electrophysiology Clinic Note    Date:  05/13/2024  Patient ID:  Grant Moreno., DOB November 21, 1940, MRN [id number] PCP:  Lenon Layman ORN, MD  Cardiologist:  None Electrophysiologist: Elspeth Sage, MD   Discussed the use of AI scribe software for clinical note transcription with the patient, who gave verbal consent to proceed.   Patient Profile    Chief Complaint: afib follow-up  History of Present Illness: Grant Moreno. is a 83 y.o. male with PMH notable for SND s/p PPM, perm afib, PVCs, Wide QRS Tachycardia felt to be VT- noninducible at EPS, HFrEF, HTN, COPD, T2DM  ; seen today for Dr. Sage for routine electrophysiology followup.   He last saw Dr. Sage 09/2023, no recent syncope. Stopped dilt and transitioned to bisoprolol .   I saw him 09/2023 for routine follow-up where EKG revealed considerable PVC burden. Patient was having increased fatigue, and orthostatic symptoms. Historically, he has not tolerated BB or CCB d/t orthostatic. I updated monitor which showed 18% PVC burden, and PET CT is scheduled.   On follow-up today, he overall feels ok, he does have intermittent palpitations and noticed that he continues to have some fatigue. His fatigue is better than at our last visit, but it is not back to his normal.  He continues to take eliquis  BID without bleeding concerns.      Arrhythmia/Device History MDT dual chamber PPM, imp 2016; dx SND   AAD -  Tikosyn  Multaq      ROS:  Please see the history of present illness. All other systems are reviewed and otherwise negative.    Physical Exam    VS:  BP 132/72 (BP Location: Left Arm, Patient Position: Sitting, Cuff Size: Normal)   Pulse 74   Ht 6' 3 (1.905 m)   Wt 196 lb 12.8 oz (89.3 kg)   SpO2 98%   BMI 24.60 kg/m  BMI: Body mass index is 24.6 kg/m.  Orthostatic VS for the past 24 hrs (Last 3 readings):  BP- Lying Pulse- Lying BP- Sitting Pulse- Sitting BP- Standing at 0 minutes Pulse- Standing at 0  minutes BP- Standing at 3 minutes Pulse- Standing at 3 minutes  05/13/24 0959 108/63 58 97/61 69 112/69 77 119/75 70    Wt Readings from Last 3 Encounters:  05/13/24 196 lb 12.8 oz (89.3 kg)  04/01/24 202 lb 3.2 oz (91.7 kg)  10/14/23 206 lb 9.6 oz (93.7 kg)     GEN- The patient is well appearing, alert and oriented x 3 today.   Lungs- Clear to ausculation bilaterally, normal work of breathing.  Heart- regularly irregular rate and rhythm, no murmurs, rubs or gallops Extremities- No peripheral edema, warm, dry Skin-  device pocket well-healed, no tethering   Device interrogation not done today     Studies Reviewed   Previous EP, cardiology notes.    EKG is ordered. Personal review of EKG from today shows:    EKG Interpretation Date/Time:  Friday May 13 2024 09:47:09 EDT Ventricular Rate:  74 PR Interval:    QRS Duration:  128 QT Interval:  386 QTC Calculation: 428 R Axis:   -67  Text Interpretation: Atrial fibrillation with a competing junctional pacemaker with premature ventricular or aberrantly conducted complexes Left axis deviation Left bundle branch block Confirmed by Vannak Montenegro [phone]) on 05/13/2024 9:55:33 AM     Long term monitor, 04/19/2024 HR 54 to 144, average 70. 100% atrial fibrillation burden Frequent ventricular ectopy, 18.7% 273 nonsustained VT  episodes, longest 19 beats Rare supraventricular and ventricular ectopy. No sustained arrhythmias.  TTE, 07/24/2022  1. Left ventricular ejection fraction, by estimation, is 25 to 30%. The left ventricle has severely decreased function. The left ventricle demonstrates global hypokinesis. The left ventricular internal cavity size was mildly dilated. Left ventricular diastolic function could not be evaluated.   2. Right ventricular systolic function is normal. The right ventricular size is normal. There is mildly elevated pulmonary artery systolic pressure. The estimated right ventricular systolic pressure is 36.9  mmHg.   3. Left atrial size was severely dilated.   4. Right atrial size was severely dilated.   5. The mitral valve is normal in structure. Mild to moderate mitral valve regurgitation.   6. Tricuspid valve regurgitation is moderate.   7. The aortic valve is tricuspid. Aortic valve regurgitation is mild to moderate. No aortic stenosis is present.   8. There is borderline dilatation of the ascending aorta, measuring 39 mm.   9. The inferior vena cava is dilated in size with <50% respiratory variability, suggesting right atrial pressure of 15 mmHg.   Comparison(s): No significant change from prior study. Prior images reviewed side by side.    TTE, 04/16/2022 LVEF 30-35%  TTE, 09/06/2020 LVEF 50-55%   Assessment and Plan     #) HFrEF, previously improved LVEF #) NICM #) SND s/p PPM #) PVCs #) perm AFib #) hypercoag d/t afib #) fatigue  Increased fatigue as of late compared to 6 months ago Elevated PVC burden on recent zio, previously intolerant to higher BB doses He has PET CT scheduled soon to eval coronaries.  Consider device upgrade with gen change Continue 25mg  losartan  daily Continue 10mg  jardiance daily       Current medicines are reviewed at length with the patient today.   The patient does not have concerns regarding his medicines.  The following changes were made today:  none  Labs/ tests ordered today include:  Orders Placed This Encounter  Procedures   EKG 12-Lead     Disposition: Follow up with Dr. Cindie or EP APP in 3 months   Signed, Kurstyn Larios, NP  05/13/24  4:21 PM  Electrophysiology [facility]

## 2024-05-13 ENCOUNTER — Ambulatory Visit: Attending: Cardiology | Admitting: Cardiology

## 2024-05-13 VITALS — BP 132/72 | HR 74 | Ht 75.0 in | Wt 196.8 lb

## 2024-05-13 DIAGNOSIS — I495 Sick sinus syndrome: Secondary | ICD-10-CM | POA: Diagnosis not present

## 2024-05-13 DIAGNOSIS — I5022 Chronic systolic (congestive) heart failure: Secondary | ICD-10-CM

## 2024-05-13 DIAGNOSIS — Z950 Presence of cardiac pacemaker: Secondary | ICD-10-CM | POA: Diagnosis not present

## 2024-05-13 DIAGNOSIS — I493 Ventricular premature depolarization: Secondary | ICD-10-CM

## 2024-05-13 DIAGNOSIS — I4821 Permanent atrial fibrillation: Secondary | ICD-10-CM

## 2024-05-13 NOTE — Patient Instructions (Signed)
 Medication Instructions:  The current medical regimen is effective;  continue present plan and medications.  *If you need a refill on your cardiac medications before your next appointment, please call your pharmacy*  Follow-Up: At [facility], you and your health needs are our priority.  As part of our continuing mission to provide you with exceptional heart care, our providers are all part of one team.  This team includes your primary Cardiologist (physician) and Advanced Practice Providers or APPs (Physician Assistants and Nurse Practitioners) who all work together to provide you with the care you need, when you need it.  Your next appointment:   8/20 at 9:40 AM   Provider:   Ole Holts, MD    We recommend signing up for the patient portal called MyChart.  Sign up information is provided on this After Visit Summary.  MyChart is used to connect with patients for Virtual Visits (Telemedicine).  Patients are able to view lab/test results, encounter notes, upcoming appointments, etc.  Non-urgent messages can be sent to your provider as well.   To learn more about what you can do with MyChart, go to [email or web address].

## 2024-05-17 ENCOUNTER — Encounter (HOSPITAL_COMMUNITY): Payer: Self-pay

## 2024-05-19 ENCOUNTER — Ambulatory Visit
Admission: RE | Admit: 2024-05-19 | Discharge: 2024-05-19 | Disposition: A | Discharge status: Home / Self Care | Source: Ambulatory Visit | Attending: Cardiology | Admitting: Cardiology

## 2024-05-19 DIAGNOSIS — I259 Chronic ischemic heart disease, unspecified: Secondary | ICD-10-CM | POA: Diagnosis not present

## 2024-05-19 DIAGNOSIS — I493 Ventricular premature depolarization: Secondary | ICD-10-CM | POA: Diagnosis present

## 2024-05-19 DIAGNOSIS — I4821 Permanent atrial fibrillation: Secondary | ICD-10-CM | POA: Diagnosis not present

## 2024-05-19 DIAGNOSIS — R160 Hepatomegaly, not elsewhere classified: Secondary | ICD-10-CM | POA: Insufficient documentation

## 2024-05-19 LAB — NM PET CT CARDIAC PERFUSION MULTI W/ABSOLUTE BLOODFLOW
LV dias vol: 150 mL (ref 62–150)
LV sys vol: 108 mL (ref 4.2–5.8)
MBFR: 2.75
Nuc Rest EF: 33 %
Nuc Stress EF: 28 %
Peak HR: 114 {beats}/min
Rest HR: 76 {beats}/min
Rest MBF: 0.96 ml/g/min
Rest Nuclear Isotope Dose: 22.5 mCi
SRS: 2
SSS: 7
ST Depression (mm): 0 mm
Stress MBF: 2.64 ml/g/min
Stress Nuclear Isotope Dose: 22.7 mCi
TID: 1.17

## 2024-05-19 MED ORDER — RUBIDIUM RB82 GENERATOR (RUBYFILL)
25.0000 | PACK | Freq: Once | INTRAVENOUS | Status: AC
  Administered 2024-05-19: 22.54 via INTRAVENOUS

## 2024-05-19 MED ORDER — REGADENOSON 0.4 MG/5ML IV SOLN
INTRAVENOUS | Status: AC
  Filled 2024-05-19: qty 5

## 2024-05-19 MED ORDER — REGADENOSON 0.4 MG/5ML IV SOLN
0.4000 mg | Freq: Once | INTRAVENOUS | Status: AC
  Administered 2024-05-19: 0.4 mg via INTRAVENOUS
  Filled 2024-05-19: qty 5

## 2024-05-19 MED ORDER — RUBIDIUM RB82 GENERATOR (RUBYFILL)
25.0000 | PACK | Freq: Once | INTRAVENOUS | Status: AC
  Administered 2024-05-19: 22.67 via INTRAVENOUS

## 2024-05-19 NOTE — Progress Notes (Signed)
 Patient presents for a cardiac PET stress test and tolerated procedure without incident. Patient maintained acceptable vital signs throughout the test and was offered caffeine after test.  Patient ambulated out of department with a steady gait.

## 2024-05-21 ENCOUNTER — Ambulatory Visit: Payer: Self-pay | Admitting: Cardiology

## 2024-05-21 DIAGNOSIS — I428 Other cardiomyopathies: Secondary | ICD-10-CM

## 2024-05-21 DIAGNOSIS — I5022 Chronic systolic (congestive) heart failure: Secondary | ICD-10-CM

## 2024-05-21 DIAGNOSIS — I4821 Permanent atrial fibrillation: Secondary | ICD-10-CM

## 2024-05-21 DIAGNOSIS — Z01812 Encounter for preprocedural laboratory examination: Secondary | ICD-10-CM

## 2024-05-23 NOTE — H&P (View-Only) (Signed)
 I called patient to discuss Cardiac PET results which show multiple areas of ischemia.  I recommended LHC to further evaluate and patient was agreeable to proceeding. We reviewed risks/benefits.    @HCINFORMEDCONSENTCOLLAPSED @  Shared Decision Making/Informed Consent The risks [stroke (1 in 1000), death (1 in 1000), kidney failure [usually temporary] (1 in 500), bleeding (1 in 200), allergic reaction [possibly serious] (1 in 200)], benefits (diagnostic support and management of coronary artery disease) and alternatives of a cardiac catheterization were discussed in detail with Grant Moreno and he is willing to proceed.

## 2024-05-23 NOTE — Progress Notes (Signed)
 I called patient to discuss Cardiac PET results which show multiple areas of ischemia.  I recommended LHC to further evaluate and patient was agreeable to proceeding. We reviewed risks/benefits.    @HCINFORMEDCONSENTCOLLAPSED @  Shared Decision Making/Informed Consent The risks [stroke (1 in 1000), death (1 in 1000), kidney failure [usually temporary] (1 in 500), bleeding (1 in 200), allergic reaction [possibly serious] (1 in 200)], benefits (diagnostic support and management of coronary artery disease) and alternatives of a cardiac catheterization were discussed in detail with Grant Moreno and he is willing to proceed.

## 2024-05-23 NOTE — Telephone Encounter (Signed)
 I returned patient's call, documented in previous note.

## 2024-05-23 NOTE — Addendum Note (Signed)
 Addended by: DASIE SHARLET RAMAN on: 05/23/2024 03:08 PM   Modules accepted: Orders

## 2024-05-23 NOTE — Telephone Encounter (Signed)
-----   Message from Suzann Riddle, NP sent at 05/23/2024  2:17 PM EDT -----  Please schedule patient for LHC with any MD in [place] at next available opening.   Le tme know when scheduled so I can enter orders.

## 2024-05-23 NOTE — Telephone Encounter (Addendum)
 Spoke with both patient and his wife. Reviewed date, time, location, and instructions. Scheduled him to have EKG on 05/25/24 at 08:00 am along with labs to be done over at the [facility] entrance.   Scheduled for: Left Heart Cath Date: 05/27/24 Time: 09:30 am Arrival Time: 08:30 am Location: [facility] Instructions: Reviewed  Patient and wife verbalized understanding of all instructions with no further questions at this time.    All instructions sent via My Chart.

## 2024-05-23 NOTE — Telephone Encounter (Signed)
 Scheduled Left Heart Cath 05/27/24 at 09:30 am with arrival time of 08:30 am with Dr. Darron.

## 2024-05-23 NOTE — Telephone Encounter (Signed)
Reviewed ER precautions

## 2024-05-23 NOTE — Telephone Encounter (Signed)
 Spoke with patient to see what day and time would work best for him. They were open to first available. Advised that I would call and get all of that information then call him back.

## 2024-05-24 ENCOUNTER — Other Ambulatory Visit: Payer: Self-pay | Admitting: *Deleted

## 2024-05-24 DIAGNOSIS — Z01812 Encounter for preprocedural laboratory examination: Secondary | ICD-10-CM

## 2024-05-24 DIAGNOSIS — I428 Other cardiomyopathies: Secondary | ICD-10-CM

## 2024-05-24 DIAGNOSIS — I4821 Permanent atrial fibrillation: Secondary | ICD-10-CM

## 2024-05-24 DIAGNOSIS — I5022 Chronic systolic (congestive) heart failure: Secondary | ICD-10-CM

## 2024-05-24 NOTE — Addendum Note (Signed)
 Addended by: DASIE SHARLET RAMAN on: 05/24/2024 08:13 AM   Modules accepted: Orders

## 2024-05-24 NOTE — Telephone Encounter (Signed)
 Spoke with patient and updated that we do not need EKG and he can have labs done here in our office. He verbalized understanding and will be by sometime tomorrow morning to have them done.

## 2024-05-25 ENCOUNTER — Ambulatory Visit

## 2024-05-26 ENCOUNTER — Ambulatory Visit: Payer: Self-pay | Admitting: Cardiology

## 2024-05-26 ENCOUNTER — Telehealth: Payer: Self-pay | Admitting: *Deleted

## 2024-05-26 LAB — CBC
Hematocrit: 42.2 % (ref 37.5–51.0)
Hemoglobin: 13.8 g/dL (ref 13.0–17.7)
MCH: 34.8 pg — ABNORMAL HIGH (ref 26.6–33.0)
MCHC: 32.7 g/dL (ref 31.5–35.7)
MCV: 106 fL — ABNORMAL HIGH (ref 79–97)
Platelets: 188 x10E3/uL (ref 150–450)
RBC: 3.97 x10E6/uL — ABNORMAL LOW (ref 4.14–5.80)
RDW: 12.4 % (ref 11.6–15.4)
WBC: 7.1 x10E3/uL (ref 3.4–10.8)

## 2024-05-26 LAB — BASIC METABOLIC PANEL WITH GFR
BUN/Creatinine Ratio: 18 (ref 10–24)
BUN: 19 mg/dL (ref 8–27)
CO2: 20 mmol/L (ref 20–29)
Calcium: 9 mg/dL (ref 8.6–10.2)
Chloride: 103 mmol/L (ref 96–106)
Creatinine, Ser: 1.06 mg/dL (ref 0.76–1.27)
Glucose: 136 mg/dL — ABNORMAL HIGH (ref 70–99)
Potassium: 4.6 mmol/L (ref 3.5–5.2)
Sodium: 138 mmol/L (ref 134–144)
eGFR: 70 mL/min/1.73 (ref >59)

## 2024-05-26 NOTE — Telephone Encounter (Signed)
 Scheduled for: Left heart cath Date: 05/27/24 Time: 09:30 Arrival Time: 08:30 am Location: [facility] Instructions: Reviewed  Patient and wife verbalized understanding of all instructions, arrival time, location and had no further questions at this time.

## 2024-05-27 ENCOUNTER — Encounter: Payer: Self-pay | Admitting: Cardiovascular Disease

## 2024-05-27 ENCOUNTER — Other Ambulatory Visit: Payer: Self-pay

## 2024-05-27 ENCOUNTER — Encounter
Admission: RE | Disposition: A | Discharge status: Home / Self Care | Source: Ambulatory Visit | Attending: Cardiovascular Disease

## 2024-05-27 ENCOUNTER — Ambulatory Visit
Admission: RE | Admit: 2024-05-27 | Discharge: 2024-05-27 | Disposition: A | Discharge status: Home / Self Care | Source: Ambulatory Visit | Attending: Cardiovascular Disease | Admitting: Cardiovascular Disease

## 2024-05-27 DIAGNOSIS — Z79899 Other long term (current) drug therapy: Secondary | ICD-10-CM | POA: Diagnosis not present

## 2024-05-27 DIAGNOSIS — I493 Ventricular premature depolarization: Secondary | ICD-10-CM | POA: Insufficient documentation

## 2024-05-27 DIAGNOSIS — I495 Sick sinus syndrome: Secondary | ICD-10-CM | POA: Insufficient documentation

## 2024-05-27 DIAGNOSIS — I4821 Permanent atrial fibrillation: Secondary | ICD-10-CM | POA: Diagnosis not present

## 2024-05-27 DIAGNOSIS — I5022 Chronic systolic (congestive) heart failure: Secondary | ICD-10-CM | POA: Insufficient documentation

## 2024-05-27 DIAGNOSIS — Z7901 Long term (current) use of anticoagulants: Secondary | ICD-10-CM | POA: Diagnosis not present

## 2024-05-27 DIAGNOSIS — Z950 Presence of cardiac pacemaker: Secondary | ICD-10-CM | POA: Insufficient documentation

## 2024-05-27 DIAGNOSIS — I110 Hypertensive heart disease with heart failure: Secondary | ICD-10-CM | POA: Diagnosis not present

## 2024-05-27 DIAGNOSIS — R931 Abnormal findings on diagnostic imaging of heart and coronary circulation: Secondary | ICD-10-CM

## 2024-05-27 HISTORY — PX: LEFT HEART CATH AND CORONARY ANGIOGRAPHY: CATH118249

## 2024-05-27 SURGERY — LEFT HEART CATH AND CORONARY ANGIOGRAPHY
Anesthesia: Moderate Sedation | Laterality: Left

## 2024-05-27 MED ORDER — LIDOCAINE HCL 1 % IJ SOLN
INTRAMUSCULAR | Status: AC
Start: 2024-05-27 — End: 2024-05-27
  Filled 2024-05-27: qty 20

## 2024-05-27 MED ORDER — SODIUM CHLORIDE 0.9 % IV SOLN: INTRAVENOUS | Status: DC

## 2024-05-27 MED ORDER — FENTANYL CITRATE (PF) 100 MCG/2ML IJ SOLN
INTRAMUSCULAR | Status: DC | PRN
  Administered 2024-05-27: 25 ug via INTRAVENOUS

## 2024-05-27 MED ORDER — LIDOCAINE HCL (PF) 1 % IJ SOLN
INTRAMUSCULAR | Status: DC | PRN
  Administered 2024-05-27: 2 mL

## 2024-05-27 MED ORDER — ASPIRIN 81 MG PO CHEW
81.0000 mg | CHEWABLE_TABLET | ORAL | Status: AC
  Administered 2024-05-27: 81 mg via ORAL
  Filled 2024-05-27: qty 1

## 2024-05-27 MED ORDER — HEPARIN (PORCINE) IN NACL 1000-0.9 UT/500ML-% IV SOLN
INTRAVENOUS | Status: DC | PRN
  Administered 2024-05-27 (×2): 500 mL

## 2024-05-27 MED ORDER — ACETAMINOPHEN 325 MG PO TABS: 650.0000 mg | ORAL_TABLET | ORAL | Status: DC | PRN

## 2024-05-27 MED ORDER — SODIUM CHLORIDE 0.9 % IV SOLN: 250.0000 mL | INTRAVENOUS | Status: DC | PRN

## 2024-05-27 MED ORDER — HEPARIN SODIUM (PORCINE) 1000 UNIT/ML IJ SOLN
INTRAMUSCULAR | Status: AC
Start: 2024-05-27 — End: 2024-05-27
  Filled 2024-05-27: qty 10

## 2024-05-27 MED ORDER — SODIUM CHLORIDE 0.9% FLUSH
3.0000 mL | Freq: Two times a day (BID) | INTRAVENOUS | Status: DC
Start: 2024-05-27 — End: 2024-05-27

## 2024-05-27 MED ORDER — HEPARIN SODIUM (PORCINE) 1000 UNIT/ML IJ SOLN
INTRAMUSCULAR | Status: DC | PRN
Start: 2024-05-27 — End: 2024-05-27
  Administered 2024-05-27: 4000 [IU] via INTRAVENOUS

## 2024-05-27 MED ORDER — IOHEXOL 300 MG/ML  SOLN
INTRAMUSCULAR | Status: DC | PRN
Start: 2024-05-27 — End: 2024-05-27
  Administered 2024-05-27: 28 mL

## 2024-05-27 MED ORDER — SODIUM CHLORIDE 0.9% FLUSH: 3.0000 mL | INTRAVENOUS | Status: DC | PRN

## 2024-05-27 MED ORDER — MIDAZOLAM HCL 2 MG/2ML IJ SOLN
INTRAMUSCULAR | Status: AC
  Filled 2024-05-27: qty 2

## 2024-05-27 MED ORDER — VERAPAMIL HCL 2.5 MG/ML IV SOLN
INTRAVENOUS | Status: DC | PRN
Start: 2024-05-27 — End: 2024-05-27
  Administered 2024-05-27: 2.5 mg via INTRA_ARTERIAL

## 2024-05-27 MED ORDER — MIDAZOLAM HCL 2 MG/2ML IJ SOLN
INTRAMUSCULAR | Status: DC | PRN
  Administered 2024-05-27: 1 mg via INTRAVENOUS

## 2024-05-27 MED ORDER — ONDANSETRON HCL 4 MG/2ML IJ SOLN: 4.0000 mg | Freq: Four times a day (QID) | INTRAMUSCULAR | Status: DC | PRN

## 2024-05-27 MED ORDER — HEPARIN (PORCINE) IN NACL 1000-0.9 UT/500ML-% IV SOLN
INTRAVENOUS | Status: AC
Start: 2024-05-27 — End: 2024-05-27
  Filled 2024-05-27: qty 1000

## 2024-05-27 MED ORDER — VERAPAMIL HCL 2.5 MG/ML IV SOLN
INTRAVENOUS | Status: AC
  Filled 2024-05-27: qty 2

## 2024-05-27 MED ORDER — FENTANYL CITRATE (PF) 100 MCG/2ML IJ SOLN
INTRAMUSCULAR | Status: AC
  Filled 2024-05-27: qty 2

## 2024-05-27 SURGICAL SUPPLY — 8 items
CATH INFINITI AMBI 5FR JK (CATHETERS) IMPLANT
DEVICE RAD TR BAND REGULAR (VASCULAR PRODUCTS) IMPLANT
DRAPE BRACHIAL (DRAPES) IMPLANT
GLIDESHEATH SLEND SS 6F .021 (SHEATH) IMPLANT
GUIDEWIRE INQWIRE 1.5J.035X260 (WIRE) IMPLANT
PACK CARDIAC CATH (CUSTOM PROCEDURE TRAY) ×1 IMPLANT
SET ATX-X65L (MISCELLANEOUS) IMPLANT
STATION PROTECTION PRESSURIZED (MISCELLANEOUS) IMPLANT

## 2024-05-27 NOTE — Interval H&P Note (Signed)
 History and Physical Interval Note:  05/27/2024 9:47 AM  Grant Moreno.  has presented today for surgery, with the diagnosis of L Cath    PVC   Abnormal PET CT.  The various methods of treatment have been discussed with the patient and family. After consideration of risks, benefits and other options for treatment, the patient has consented to  Procedure(s): LEFT HEART CATH AND CORONARY ANGIOGRAPHY (Left) as a surgical intervention.  The patient's history has been reviewed, patient examined, no change in status, stable for surgery.  I have reviewed the patient's chart and labs.  Questions were answered to the patient's satisfaction.     Grant Moreno

## 2024-05-30 ENCOUNTER — Encounter: Payer: Self-pay | Admitting: Cardiovascular Disease

## 2024-05-31 MED ORDER — AMIODARONE HCL 200 MG PO TABS
ORAL_TABLET | ORAL | 3 refills | Status: DC
Start: 2024-05-31 — End: 2024-08-03

## 2024-05-31 NOTE — Telephone Encounter (Signed)
 I called patient to discuss LHC and next steps to treat his PVC.   Will start amiodarone  - 200mg  BID x 2 wks, then 200mg  daily.  Recent LFTs and thryoid labs stable, he has appt in about a month with Dr. Cindie to establish care with new MD.   No other medication changes at this time.

## 2024-06-30 ENCOUNTER — Encounter

## 2024-07-05 ENCOUNTER — Ambulatory Visit (INDEPENDENT_AMBULATORY_CARE_PROVIDER_SITE_OTHER)

## 2024-07-05 DIAGNOSIS — I428 Other cardiomyopathies: Secondary | ICD-10-CM | POA: Diagnosis not present

## 2024-07-06 ENCOUNTER — Ambulatory Visit: Admitting: Cardiology

## 2024-07-06 LAB — CUP PACEART REMOTE DEVICE CHECK
Battery Remaining Longevity: 7 mo
Battery Voltage: 2.87 V
Brady Statistic RA Percent Paced: 0 %
Brady Statistic RV Percent Paced: 8.07 %
Date Time Interrogation Session: 20250819085145
Implantable Lead Connection Status: 753985
Implantable Lead Connection Status: 753985
Implantable Lead Implant Date: 20160921
Implantable Lead Implant Date: 20160921
Implantable Lead Location: 753859
Implantable Lead Location: 753860
Implantable Lead Model: 5076
Implantable Lead Model: 5076
Implantable Pulse Generator Implant Date: 20160921
Lead Channel Impedance Value: 304 Ohm
Lead Channel Impedance Value: 304 Ohm
Lead Channel Impedance Value: 361 Ohm
Lead Channel Impedance Value: 399 Ohm
Lead Channel Pacing Threshold Amplitude: 0.375 V
Lead Channel Pacing Threshold Amplitude: 1.5 V
Lead Channel Pacing Threshold Pulse Width: 0.4 ms
Lead Channel Pacing Threshold Pulse Width: 0.4 ms
Lead Channel Sensing Intrinsic Amplitude: 0.375 mV
Lead Channel Sensing Intrinsic Amplitude: 0.375 mV
Lead Channel Sensing Intrinsic Amplitude: 5.25 mV
Lead Channel Sensing Intrinsic Amplitude: 5.25 mV
Lead Channel Setting Pacing Amplitude: 3 V
Lead Channel Setting Pacing Pulse Width: 0.4 ms
Lead Channel Setting Sensing Sensitivity: 2.8 mV
Zone Setting Status: 755011
Zone Setting Status: 755011

## 2024-07-07 ENCOUNTER — Ambulatory Visit: Payer: Self-pay | Admitting: Cardiology

## 2024-08-02 NOTE — Progress Notes (Unsigned)
 Electrophysiology Office Follow up Visit Note:    Date:  08/03/2024   ID:  Grant Moreno., DOB Jul 04, 1941, MRN [id number]  PCP:  Lenon Layman ORN, MD  [facility] Cardiologist:  None  [facility] Electrophysiologist:  OLE ONEIDA HOLTS, MD    Interval History:     Grant Moreno. is a 83 y.o. male who presents for a follow up visit.   The patient was last seen by [facility] May 13, 2024.  Was previously followed by Dr. Fernande.  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  He has permanent atrial fibrillation.  He also has a chronically reduced ejection fraction, hypertension, COPD and diabetes.  He is with family today in clinic.  He tells me that for the first few weeks after starting the twice daily amiodarone  he felt well and then when he reduced it to once a day his symptoms came back partially.  He still feels better than prior to starting amiodarone .  He reports prior to starting the amiodarone  he had fatigue with exertion and felt like his legs were heavy.  He does not feel the skipped beats/PVCs.      Past medical, surgical, social and family history were reviewed.  ROS:   Please see the history of present illness.    All other systems reviewed and are negative.  EKGs/Labs/Other Studies Reviewed:    The following studies were reviewed today:  July 06, 2024 device interrogation showed an 8% burden of pacing  August 03, 2024 and clinic device interrogation personally reviewed Battery longevity approximately 6 months until ERI Lead parameter stable  May 13, 2024 EKG shows atrial fibrillation, frequent monomorphic PVCs.  PVCs have a superior axis  April 23, 2024 ZIO monitor showed frequent ventricular ectopy, 18.7%       Physical Exam:    VS:  BP 108/60 (BP Location: Left Arm, Patient Position: Sitting, Cuff Size: Normal)   Pulse (!) 57   Ht 6' 3 (1.905 m)   Wt 192 lb 8 oz (87.3 kg)   SpO2 98%   BMI 24.06 kg/m      Wt Readings from Last 3 Encounters:  08/03/24 192 lb 8 oz (87.3 kg)  05/27/24 196 lb (88.9 kg)  05/13/24 196 lb 12.8 oz (89.3 kg)     GEN: no distress CARD: RRR, No MRG.  Device pocket well-healed RESP: No IWOB. CTAB.      ASSESSMENT:    1. Chronic systolic heart failure (HCC)   2. PVC (premature ventricular contraction)   3. Cardiac pacemaker in situ   4. Permanent atrial fibrillation (HCC)    PLAN:    In order of problems listed above:  #Chronic systolic heart failure #Frequent PVCs NYHA class II.  Last EF was measured in 2023 at 25 to 30% No significant coronary artery disease on 2025 left heart cath  I do think it will be important for him to suppress the PVCs to see if this is contributing to his cardiomyopathy. I discussed options for this. Given his reduced LV function, our options are limited to amiodarone .  He has had a partial response to amiodarone .  I would like to continue this for at least 3 more months and repeat an echo after he has reached steady state.  Plan for echocardiogram in 3 months.   He will need CMP, TSH and FT4 today.  Given his age, I do not think upgrade to defibrillator is currently indicated.  I would recommend medical therapy for his reduced left ventricular function.   #Permanent pacemaker in situ Device functioning appropriately.  Continue remote monitoring  #Atrial fibrillation, permanent Continue Eliquis  for stroke prophylaxis    Signed, Ole Holts, MD, [facility], [facility] 08/03/2024 10:48 AM    Electrophysiology  [facility]

## 2024-08-03 ENCOUNTER — Other Ambulatory Visit: Payer: Self-pay

## 2024-08-03 ENCOUNTER — Ambulatory Visit: Attending: Cardiology | Admitting: Cardiology

## 2024-08-03 ENCOUNTER — Encounter: Payer: Self-pay | Admitting: Cardiology

## 2024-08-03 VITALS — BP 108/60 | HR 57 | Ht 75.0 in | Wt 192.5 lb

## 2024-08-03 DIAGNOSIS — Z950 Presence of cardiac pacemaker: Secondary | ICD-10-CM

## 2024-08-03 DIAGNOSIS — I4821 Permanent atrial fibrillation: Secondary | ICD-10-CM

## 2024-08-03 DIAGNOSIS — I5022 Chronic systolic (congestive) heart failure: Secondary | ICD-10-CM

## 2024-08-03 DIAGNOSIS — I493 Ventricular premature depolarization: Secondary | ICD-10-CM

## 2024-08-03 LAB — CUP PACEART INCLINIC DEVICE CHECK
Date Time Interrogation Session: 20250917122135
Implantable Lead Connection Status: 753985
Implantable Lead Connection Status: 753985
Implantable Lead Implant Date: 20160921
Implantable Lead Implant Date: 20160921
Implantable Lead Location: 753859
Implantable Lead Location: 753860
Implantable Lead Model: 5076
Implantable Lead Model: 5076
Implantable Pulse Generator Implant Date: 20160921
Lead Channel Impedance Value: 361 Ohm
Lead Channel Impedance Value: 399 Ohm
Lead Channel Pacing Threshold Amplitude: 1.5 V
Lead Channel Pacing Threshold Pulse Width: 0.4 ms
Lead Channel Sensing Intrinsic Amplitude: 0.4 mV
Lead Channel Sensing Intrinsic Amplitude: 5.3 mV

## 2024-08-03 MED ORDER — TORSEMIDE 20 MG PO TABS: 10.0000 mg | ORAL_TABLET | Freq: Every day | ORAL | 0 refills | Status: AC | PRN

## 2024-08-03 MED ORDER — AMIODARONE HCL 200 MG PO TABS: 200.0000 mg | ORAL_TABLET | Freq: Every day | ORAL | 3 refills | Status: DC

## 2024-08-03 MED ORDER — APIXABAN 5 MG PO TABS: 5.0000 mg | ORAL_TABLET | Freq: Two times a day (BID) | ORAL | 1 refills | Status: AC

## 2024-08-03 MED ORDER — LOSARTAN POTASSIUM 25 MG PO TABS: 25.0000 mg | ORAL_TABLET | Freq: Every day | ORAL | 2 refills | Status: AC

## 2024-08-03 NOTE — Patient Instructions (Signed)
 Medication Instructions:  Your physician recommends that you continue on your current medications as directed. Please refer to the Current Medication list given to you today.   *If you need a refill on your cardiac medications before your next appointment, please call your pharmacy*  Lab Work: TODAY: CMET, TSH, T4  Testing/Procedures: Echocardiogram - in 3 months Your physician has requested that you have an echocardiogram. Echocardiography is a painless test that uses sound waves to create images of your heart. It provides your doctor with information about the size and shape of your heart and how well your heart's chambers and valves are working. This procedure takes approximately one hour. There are no restrictions for this procedure. Please do NOT wear cologne, perfume, aftershave, or lotions (deodorant is allowed). Please arrive 15 minutes prior to your appointment time.  Please note: We ask at that you not bring children with you during ultrasound (echo/ vascular) testing. Due to room size and safety concerns, children are not allowed in the ultrasound rooms during exams. Our front office staff cannot provide observation of children in our lobby area while testing is being conducted. An adult accompanying a patient to their appointment will only be allowed in the ultrasound room at the discretion of the ultrasound technician under special circumstances. We apologize for any inconvenience.  Pacemaker Generator Change - we will call you when its time to schedule  Follow-Up: At [facility], you and your health needs are our priority.  As part of our continuing mission to provide you with exceptional heart care, our providers are all part of one team.  This team includes your primary Cardiologist (physician) and Advanced Practice Providers or APPs (Physician Assistants and Nurse Practitioners) who all work together to provide you with the care you need, when you need it.  Your next  appointment:   1 year  Provider:   You may see OLE ONEIDA HOLTS, MD or one of the following Advanced Practice Providers on your designated Care Team:   Charlies Arthur, [place] Ozell Jodie Passey, PA-C Suzann Riddle, NP Daphne Barrack, NP Artist Pouch, PA-C

## 2024-08-04 ENCOUNTER — Ambulatory Visit: Payer: Self-pay | Admitting: Cardiology

## 2024-08-04 DIAGNOSIS — I10 Essential (primary) hypertension: Secondary | ICD-10-CM

## 2024-08-04 DIAGNOSIS — I5022 Chronic systolic (congestive) heart failure: Secondary | ICD-10-CM

## 2024-08-04 DIAGNOSIS — I340 Nonrheumatic mitral (valve) insufficiency: Secondary | ICD-10-CM

## 2024-08-04 LAB — COMPREHENSIVE METABOLIC PANEL WITH GFR
ALT: 28 IU/L (ref 0–44)
AST: 28 IU/L (ref 0–40)
Albumin: 4.3 g/dL (ref 3.7–4.7)
Alkaline Phosphatase: 88 IU/L (ref 48–129)
BUN/Creatinine Ratio: 20 (ref 10–24)
BUN: 21 mg/dL (ref 8–27)
Bilirubin Total: 0.5 mg/dL (ref 0.0–1.2)
CO2: 19 mmol/L — ABNORMAL LOW (ref 20–29)
Calcium: 9 mg/dL (ref 8.6–10.2)
Chloride: 104 mmol/L (ref 96–106)
Creatinine, Ser: 1.06 mg/dL (ref 0.76–1.27)
Globulin, Total: 2.2 g/dL (ref 1.5–4.5)
Glucose: 78 mg/dL (ref 70–99)
Potassium: 4.5 mmol/L (ref 3.5–5.2)
Sodium: 140 mmol/L (ref 134–144)
Total Protein: 6.5 g/dL (ref 6.0–8.5)
eGFR: 70 mL/min/1.73 (ref >59)

## 2024-08-04 LAB — T4, FREE: Free T4: 1.47 ng/dL (ref 0.82–1.77)

## 2024-08-04 LAB — TSH: TSH: 4.12 u[IU]/mL (ref 0.450–4.500)

## 2024-08-10 NOTE — Progress Notes (Signed)
 Remote PPM Transmission

## 2024-08-30 ENCOUNTER — Ambulatory Visit

## 2024-08-31 ENCOUNTER — Encounter

## 2024-08-31 LAB — CUP PACEART REMOTE DEVICE CHECK
Battery Remaining Longevity: 6 mo
Battery Voltage: 2.86 V
Brady Statistic RA Percent Paced: 0 %
Brady Statistic RV Percent Paced: 17.26 %
Date Time Interrogation Session: 20251014080400
Implantable Lead Connection Status: 753985
Implantable Lead Connection Status: 753985
Implantable Lead Implant Date: 20160921
Implantable Lead Implant Date: 20160921
Implantable Lead Location: 753859
Implantable Lead Location: 753860
Implantable Lead Model: 5076
Implantable Lead Model: 5076
Implantable Pulse Generator Implant Date: 20160921
Lead Channel Impedance Value: 304 Ohm
Lead Channel Impedance Value: 304 Ohm
Lead Channel Impedance Value: 361 Ohm
Lead Channel Impedance Value: 399 Ohm
Lead Channel Pacing Threshold Amplitude: 0.375 V
Lead Channel Pacing Threshold Amplitude: 1.75 V
Lead Channel Pacing Threshold Pulse Width: 0.4 ms
Lead Channel Pacing Threshold Pulse Width: 0.4 ms
Lead Channel Sensing Intrinsic Amplitude: 1 mV
Lead Channel Sensing Intrinsic Amplitude: 1 mV
Lead Channel Sensing Intrinsic Amplitude: 5.625 mV
Lead Channel Sensing Intrinsic Amplitude: 5.625 mV
Lead Channel Setting Pacing Amplitude: 3.5 V
Lead Channel Setting Pacing Pulse Width: 0.4 ms
Lead Channel Setting Sensing Sensitivity: 2.8 mV
Zone Setting Status: 755011
Zone Setting Status: 755011

## 2024-09-29 ENCOUNTER — Encounter

## 2024-10-03 ENCOUNTER — Ambulatory Visit (INDEPENDENT_AMBULATORY_CARE_PROVIDER_SITE_OTHER)

## 2024-10-03 DIAGNOSIS — I493 Ventricular premature depolarization: Secondary | ICD-10-CM | POA: Diagnosis not present

## 2024-10-03 LAB — CUP PACEART REMOTE DEVICE CHECK
Battery Remaining Longevity: 4 mo
Battery Voltage: 2.86 V
Brady Statistic RA Percent Paced: 0 %
Brady Statistic RV Percent Paced: 13.51 %
Date Time Interrogation Session: 20251117090024
Implantable Lead Connection Status: 753985
Implantable Lead Connection Status: 753985
Implantable Lead Implant Date: 20160921
Implantable Lead Implant Date: 20160921
Implantable Lead Location: 753859
Implantable Lead Location: 753860
Implantable Lead Model: 5076
Implantable Lead Model: 5076
Implantable Pulse Generator Implant Date: 20160921
Lead Channel Impedance Value: 304 Ohm
Lead Channel Impedance Value: 304 Ohm
Lead Channel Impedance Value: 361 Ohm
Lead Channel Impedance Value: 399 Ohm
Lead Channel Pacing Threshold Amplitude: 0.375 V
Lead Channel Pacing Threshold Amplitude: 1.375 V
Lead Channel Pacing Threshold Pulse Width: 0.4 ms
Lead Channel Pacing Threshold Pulse Width: 0.4 ms
Lead Channel Sensing Intrinsic Amplitude: 0.625 mV
Lead Channel Sensing Intrinsic Amplitude: 0.625 mV
Lead Channel Sensing Intrinsic Amplitude: 6.375 mV
Lead Channel Sensing Intrinsic Amplitude: 6.375 mV
Lead Channel Setting Pacing Amplitude: 3.25 V
Lead Channel Setting Pacing Pulse Width: 0.4 ms
Lead Channel Setting Sensing Sensitivity: 2.8 mV
Zone Setting Status: 755011
Zone Setting Status: 755011

## 2024-10-04 ENCOUNTER — Ambulatory Visit: Payer: Self-pay | Admitting: Cardiology

## 2024-10-04 ENCOUNTER — Encounter

## 2024-10-05 NOTE — Progress Notes (Signed)
 Remote PPM Transmission

## 2024-10-31 ENCOUNTER — Ambulatory Visit: Payer: Medicare Other | Admitting: Dermatology

## 2024-10-31 ENCOUNTER — Encounter: Payer: Self-pay | Admitting: Dermatology

## 2024-10-31 DIAGNOSIS — L821 Other seborrheic keratosis: Secondary | ICD-10-CM

## 2024-10-31 DIAGNOSIS — D229 Melanocytic nevi, unspecified: Secondary | ICD-10-CM

## 2024-10-31 DIAGNOSIS — Z7189 Other specified counseling: Secondary | ICD-10-CM

## 2024-10-31 DIAGNOSIS — L578 Other skin changes due to chronic exposure to nonionizing radiation: Secondary | ICD-10-CM

## 2024-10-31 DIAGNOSIS — D692 Other nonthrombocytopenic purpura: Secondary | ICD-10-CM

## 2024-10-31 DIAGNOSIS — Z1283 Encounter for screening for malignant neoplasm of skin: Secondary | ICD-10-CM

## 2024-10-31 DIAGNOSIS — L814 Other melanin hyperpigmentation: Secondary | ICD-10-CM

## 2024-10-31 DIAGNOSIS — Z86018 Personal history of other benign neoplasm: Secondary | ICD-10-CM

## 2024-10-31 DIAGNOSIS — D492 Neoplasm of unspecified behavior of bone, soft tissue, and skin: Secondary | ICD-10-CM

## 2024-10-31 DIAGNOSIS — D1801 Hemangioma of skin and subcutaneous tissue: Secondary | ICD-10-CM

## 2024-10-31 DIAGNOSIS — L570 Actinic keratosis: Secondary | ICD-10-CM

## 2024-10-31 DIAGNOSIS — Z79899 Other long term (current) drug therapy: Secondary | ICD-10-CM

## 2024-10-31 MED ORDER — FLUOROURACIL 5 % EX CREA: TOPICAL_CREAM | Freq: Two times a day (BID) | CUTANEOUS | 3 refills | Status: AC

## 2024-10-31 NOTE — Patient Instructions (Addendum)
 Wound Care Instructions  Cleanse wound gently with soap and water once a day then pat dry with clean gauze. Apply a thin coat of Petrolatum (petroleum jelly, Vaseline) over the wound (unless you have an allergy to this). We recommend that you use a new, sterile tube of Vaseline. Do not pick or remove scabs. Do not remove the yellow or white healing tissue from the base of the wound.  Cover the wound with fresh, clean, nonstick gauze and secure with paper tape. You may use Band-Aids in place of gauze and tape if the wound is small enough, but would recommend trimming much of the tape off as there is often too much. Sometimes Band-Aids can irritate the skin.  You should call the office for your biopsy report after 1 week if you have not already been contacted.  If you experience any problems, such as abnormal amounts of bleeding, swelling, significant bruising, significant pain, or evidence of infection, please call the office immediately.  FOR ADULT SURGERY PATIENTS: If you need something for pain relief you may take 1 extra strength Tylenol  (acetaminophen ) AND 2 Ibuprofen (200mg  each) together every 4 hours as needed for pain. (do not take these if you are allergic to them or if you have a reason you should not take them.) Typically, you may only need pain medication for 1 to 3 days.      Cryotherapy Aftercare  Wash gently with soap and water everyday.   Apply Vaseline and Band-Aid daily until healed.   -- Start 5-fluorouracil /calcipotriene  cream twice a day for 7 days to affected areas including scalp and then twice a day for 5 days to bilateral temples, cheeks, and twice a day for 7-10 days to arms/tops of hands. Prescription sent to [facility]. Patient advised they will receive an email to purchase the medication online and have it sent to their home. Patient provided with handout reviewing treatment course and side effects and advised to call or message us  on  MyChart with any concerns.  Instructions for [facility] Medications  One or more of your medications was sent to the [facility] mail order compound[facility] pharmacy. You will receive an email from them and can purchase the medicine through that link. It will then be mailed to your home at the address you confirmed. If for any reason you do not receive an email from them, please check your spam folder. If you still do not find the email, please let us  know. [facility] phone number is [phone].    5-Fluorouracil /Calcipotriene  Patient Education   Actinic keratoses are the dry, red scaly spots on the skin caused by sun damage. A portion of these spots can turn into skin cancer with time, and treating them can help prevent development of skin cancer.   Treatment of these spots requires removal of the defective skin cells. There are various ways to remove actinic keratoses, including freezing with liquid nitrogen, treatment with creams, or treatment with a blue light procedure in the office.   5-fluorouracil  cream is a topical cream used to treat actinic keratoses. It works by interfering with the growth of abnormal fast-growing skin cells, such as actinic keratoses. These cells peel off and are replaced by healthy ones.   5-fluorouracil /calcipotriene  is a combination of the 5-fluorouracil  cream with a vitamin D analog cream called calcipotriene . The calcipotriene  alone does not treat actinic keratoses. However, when it is combined with 5-fluorouracil , it helps the 5-fluorouracil  treat the actinic keratoses much faster so  that the same results can be achieved with a much shorter treatment time.  INSTRUCTIONS FOR 5-FLUOROURACIL /CALCIPOTRIENE  CREAM:   5-fluorouracil /calcipotriene  cream typically only needs to be used for 4-7 days. A thin layer should be applied twice a day to the treatment areas recommended by your physician.   If your physician prescribed you separate tubes of  5-fluourouracil and calcipotriene , apply a thin layer of 5-fluorouracil  followed by a thin layer of calcipotriene .   Avoid contact with your eyes, nostrils, and mouth. Do not use 5-fluorouracil /calcipotriene  cream on infected or open wounds.   You will develop redness, irritation and some crusting at areas where you have pre-cancer damage/actinic keratoses. IF YOU DEVELOP PAIN, BLEEDING, OR SIGNIFICANT CRUSTING, STOP THE TREATMENT EARLY - you have already gotten a good response and the actinic keratoses should clear up well.  Wash your hands after applying 5-fluorouracil  5% cream on your skin.   A moisturizer or sunscreen with a minimum SPF 30 should be applied each morning.   Once you have finished the treatment, you can apply a thin layer of Vaseline twice a day to irritated areas to soothe and calm the areas more quickly. If you experience significant discomfort, contact your physician.  For some patients it is necessary to repeat the treatment for best results.  SIDE EFFECTS: When using 5-fluorouracil /calcipotriene  cream, you may have mild irritation, such as redness, dryness, swelling, or a mild burning sensation. This usually resolves within 2 weeks. The more actinic keratoses you have, the more redness and inflammation you can expect during treatment. Eye irritation has been reported rarely. If this occurs, please let us  know.  If you have any trouble using this cream, please call the office. If you have any other questions about this information, please do not hesitate to ask me before you leave the office.  Recommend daily broad spectrum sunscreen SPF 30+ to sun-exposed areas, reapply every 2 hours as needed. Call for new or changing lesions.  Staying in the shade or wearing long sleeves, sun glasses (UVA+UVB protection) and wide brim hats (4-inch brim around the entire circumference of the hat) are also recommended for sun protection.     Due to recent changes in healthcare laws, you  may see results of your pathology and/or laboratory studies on MyChart before the doctors have had a chance to review them. We understand that in some cases there may be results that are confusing or concerning to you. Please understand that not all results are received at the same time and often the doctors may need to interpret multiple results in order to provide you with the best plan of care or course of treatment. Therefore, we ask that you please give us  2 business days to thoroughly review all your results before contacting the office for clarification. Should we see a critical lab result, you will be contacted sooner.   If You Need Anything After Your Visit  If you have any questions or concerns for your doctor, please call our main line at [phone] and press option 4 to reach your doctor's medical assistant. If no one answers, please leave a voicemail as directed and we will return your call as soon as possible. Messages left after 4 pm will be answered the following business day.   You may also send us  a message via MyChart. We typically respond to MyChart messages within 1-2 business days.  For prescription refills, please ask your pharmacy to contact our office. Our fax number is [phone].  If you  have an urgent issue when the clinic is closed that cannot wait until the next business day, you can page your doctor at the number below.    Please note that while we do our best to be available for urgent issues outside of office hours, we are not available 24/7.   If you have an urgent issue and are unable to reach us , you may choose to seek medical care at your doctor's office, retail clinic, urgent care center, or emergency room.  If you have a medical emergency, please immediately call 911 or go to the emergency department.  Pager Numbers  - Dr. Hester: [phone]  - Dr. Jackquline: [phone]  - Dr. Claudene: [phone]   - Dr. Raymund: [phone]  In the event of  inclement weather, please call our main line at [phone] for an update on the status of any delays or closures.  Dermatology Medication Tips: Please keep the boxes that topical medications come in in order to help keep track of the instructions about where and how to use these. Pharmacies typically print the medication instructions only on the boxes and not directly on the medication tubes.   If your medication is too expensive, please contact our office at [phone] option 4 or send us  a message through MyChart.   We are unable to tell what your co-pay for medications will be in advance as this is different depending on your insurance coverage. However, we may be able to find a substitute medication at lower cost or fill out paperwork to get insurance to cover a needed medication.   If a prior authorization is required to get your medication covered by your insurance company, please allow us  1-2 business days to complete this process.  Drug prices often vary depending on where the prescription is filled and some pharmacies may offer cheaper prices.  The website www.goodrx.com contains coupons for medications through different pharmacies. The prices here do not account for what the cost may be with help from insurance (it may be cheaper with your insurance), but the website can give you the price if you did not use any insurance.  - You can print the associated coupon and take it with your prescription to the pharmacy.  - You may also stop by our office during regular business hours and pick up a GoodRx coupon card.  - If you need your prescription sent electronically to a different pharmacy, notify our office through [facility] or by phone at [phone] option 4.     Si Usted Necesita Algo Despus de Su Visita  Tambin puede enviarnos un mensaje a travs de Clinical Cytogeneticist. Por lo general respondemos a los mensajes de MyChart en el transcurso de 1 a 2 das hbiles.  Para renovar  recetas, por favor pida a su farmacia que se ponga en contacto con nuestra oficina. Randi lakes de fax es [place] [phone].  Si tiene un asunto urgente cuando la clnica est cerrada y que no puede esperar hasta el siguiente da hbil, puede llamar/localizar a su doctor(a) al nmero que aparece a continuacin.   Por favor, tenga en cuenta que aunque hacemos todo lo posible para estar disponibles para asuntos urgentes fuera del horario de [place], no estamos disponibles las 24 horas del da, los 7 [address]  [address] de la [place].   Si tiene un problema urgente y no puede comunicarse con nosotros, puede optar por buscar atencin mdica  en el consultorio de su doctor(a), en una clnica privada, en un centro de  atencin urgente o en una sala de emergencias.  Si tiene engineer, drilling, por favor llame inmediatamente al 911 o vaya a la sala de emergencias.  Nmeros de bper  - Dr. Hester: [phone]  - Dra. Jackquline: [phone]  - Dr. Claudene: [phone]  - Dra. Kitts: [phone]  En caso de inclemencias del [place], por favor llame a nuestra lnea principal al [phone] para una actualizacin sobre el estado de cualquier retraso o cierre.  Consejos para la medicacin en dermatologa: Por favor, guarde las cajas en las que vienen los medicamentos de uso tpico para ayudarle a seguir las instrucciones sobre dnde y cmo usarlos. Las farmacias generalmente imprimen las instrucciones del medicamento slo en las cajas y no directamente en los tubos del [place].   Si su medicamento es muy caro, por favor, pngase en contacto con landry rieger llamando al [phone] y presione la opcin 4 o envenos un mensaje a travs de Clinical Cytogeneticist.   No podemos decirle cul ser su copago por los medicamentos por adelantado ya que esto es diferente dependiendo de la cobertura de su seguro. Sin embargo, es posible que podamos encontrar un medicamento sustituto a audiological scientist un formulario para que el  seguro cubra el medicamento que se considera necesario.   Si se requiere una autorizacin previa para que su compaa de seguros cubra su medicamento, por favor permtanos de 1 a 2 das hbiles para completar este proceso.  Los precios de los medicamentos varan con frecuencia dependiendo del environmental consultant de dnde se surte la receta y alguna farmacias pueden ofrecer precios ms baratos.  El sitio web www.goodrx.com tiene cupones para medicamentos de health and safety inspector. Los precios aqu no tienen en cuenta lo que podra costar con la ayuda del seguro (puede ser ms barato con su seguro), pero el sitio web puede darle el precio si no utiliz tourist information centre manager.  - Puede imprimir el cupn correspondiente y llevarlo con su receta a la farmacia.  - Tambin puede pasar por nuestra oficina durante el horario de atencin regular y education officer, museum una tarjeta de cupones de GoodRx.  - Si necesita que su receta se enve electrnicamente a una farmacia diferente, informe a nuestra oficina a travs de MyChart de [place] o por telfono llamando al [phone] y presione la opcin 4.

## 2024-10-31 NOTE — Progress Notes (Unsigned)
 Follow-Up Visit   Subjective  Grant Steil. is a 83 y.o. male who presents for the following: Skin Cancer Screening and Upper Body Skin Exam, hx of Dysplastic nevus, hx of AKs  The patient presents for Upper Body Skin Exam (UBSE) for skin cancer screening and mole check. The patient has spots, moles and lesions to be evaluated, some may be new or changing and the patient may have concern these could be cancer.    The following portions of the chart were reviewed this encounter and updated as appropriate: medications, allergies, medical history  Review of Systems:  No other skin or systemic complaints except as noted in HPI or Assessment and Plan.  Objective  Well appearing patient in no apparent distress; mood and affect are within normal limits.  All skin waist up examined. Relevant physical exam findings are noted in the Assessment and Plan.  vertex scalp x 7, R antehelix x 1, L preauricular x 1, L zygoma x 3, R forehead x 1, R zygoma x 2, R medial chest x 2, R upper chest x 1, L thainar hand dorsal x 1 (19) Pink scaly macules L upper ear helix Pink heme crusted macule 5.4mm   Assessment & Plan   AK (ACTINIC KERATOSIS) (19) vertex scalp x 7, R antehelix x 1, L preauricular x 1, L zygoma x 3, R forehead x 1, R zygoma x 2, R medial chest x 2, R upper chest x 1, L thainar hand dorsal x 1 (19) Actinic keratoses are precancerous spots that appear secondary to cumulative UV radiation exposure/sun exposure over time. They are chronic with expected duration over 1 year. A portion of actinic keratoses will progress to squamous cell carcinoma of the skin. It is not possible to reliably predict which spots will progress to skin cancer and so treatment is recommended to prevent development of skin cancer.  Recommend daily broad spectrum sunscreen SPF 30+ to sun-exposed areas, reapply every 2 hours as needed.  Recommend staying in the shade or wearing long sleeves, sun glasses (UVA+UVB  protection) and wide brim hats (4-inch brim around the entire circumference of the hat). Call for new or changing lesions. - Destruction of lesion - vertex scalp x 7, R antehelix x 1, L preauricular x 1, L zygoma x 3, R forehead x 1, R zygoma x 2, R medial chest x 2, R upper chest x 1, L thainar hand dorsal x 1 (19)  Destruction method: cryotherapy   Informed consent: discussed and consent obtained   Lesion destroyed using liquid nitrogen: Yes   Region frozen until ice ball extended beyond lesion: Yes   Outcome: patient tolerated procedure well with no complications   Post-procedure details: wound care instructions given   Additional details:  Prior to procedure, discussed risks of blister formation, small wound, skin dyspigmentation, or rare scar following cryotherapy. Recommend Vaseline ointment to treated areas while healing.   NEOPLASM OF SKIN L upper ear helix - Skin / nail biopsy Type of biopsy: tangential   Informed consent: discussed and consent obtained   Anesthesia: the lesion was anesthetized in a standard fashion   Anesthesia comment:  Area prepped with alcohol Anesthetic:  1% lidocaine  w/ epinephrine  1-100,000 buffered w/ 8.4% NaHCO3 Instrument used: flexible razor blade   Hemostasis achieved with: pressure, aluminum chloride and electrodesiccation   Outcome: patient tolerated procedure well   Post-procedure details: wound care instructions given   Post-procedure details comment:  Ointment and small bandage applied  Specimen  1 - Surgical pathology Differential Diagnosis: AK r/o BCC  Check Margins: No Pink heme crusted macule 5.37mm  Skin cancer screening performed today.   ACTINIC DAMAGE WITH PRECANCEROUS ACTINIC KERATOSES Counseling for Topical Chemotherapy Management: Patient exhibits: - Severe, confluent actinic changes with pre-cancerous actinic keratoses that is secondary to cumulative UV radiation exposure over time - Condition that is severe; chronic, not at  goal. - diffuse scaly erythematous macules and papules with underlying dyspigmentation - Discussed Prescription Field Treatment topical Chemotherapy for Severe, Chronic Confluent Actinic Changes with Pre-Cancerous Actinic Keratoses Field treatment involves treatment of an entire area of skin that has confluent Actinic Changes (Sun/ Ultraviolet light damage) and PreCancerous Actinic Keratoses by method of PhotoDynamic Therapy (PDT) and/or prescription Topical Chemotherapy agents such as 5-fluorouracil , 5-fluorouracil /calcipotriene , and/or imiquimod.  The purpose is to decrease the number of clinically evident and subclinical PreCancerous lesions to prevent progression to development of skin cancer by chemically destroying early precancer changes that may or may not be visible.  It has been shown to reduce the risk of developing skin cancer in the treated area. As a result of treatment, redness, scaling, crusting, and open sores may occur during treatment course. One or more than one of these methods may be used and may have to be used several times to control, suppress and eliminate the PreCancerous changes. Discussed treatment course, expected reaction, and possible side effects. - Recommend daily broad spectrum sunscreen SPF 30+ to sun-exposed areas, reapply every 2 hours as needed.  - Staying in the shade or wearing long sleeves, sun glasses (UVA+UVB protection) and wide brim hats (4-inch brim around the entire circumference of the hat) are also recommended. - Call for new or changing lesions.  -- Start 5-fluorouracil /calcipotriene  cream twice a day for 7 days to affected areas including scalp and then twice a day for 5 days to bilateral temples, cheeks, and twice a day for 7-10 days to arms/tops of hands. Prescription sent to [facility]. Patient advised they will receive an email to purchase the medication online and have it sent to their home. Patient provided with handout  reviewing treatment course and side effects and advised to call or message us  on MyChart with any concerns.  Reviewed course of treatment and expected reaction.  Patient advised to expect inflammation and crusting and advised that erosions are possible.  Patient advised to be diligent with sun protection during and after treatment. Counseled to keep medication out of reach of children and pets.   Lentigines, Seborrheic Keratoses, Hemangiomas - Benign normal skin lesions - Benign-appearing - Call for any changes  Melanocytic Nevi - Tan-brown and/or pink-flesh-colored symmetric macules and papules - Benign appearing on exam today - Observation - Call clinic for new or changing moles - Recommend daily use of broad spectrum spf 30+ sunscreen to sun-exposed areas.   HISTORY OF DYSPLASTIC NEVUS No evidence of recurrence today Recommend regular full body skin exams Recommend daily broad spectrum sunscreen SPF 30+ to sun-exposed areas, reapply every 2 hours as needed.  Call if any new or changing lesions are noted between office visits  - Lower back  Purpura - Chronic; persistent and recurrent.  Treatable, but not curable. - Violaceous macules and patches - Benign - Related to trauma, age, sun damage and/or use of blood thinners, chronic use of topical and/or oral steroids - Observe - Can use OTC arnica containing moisturizer such as Dermend Bruise Formula if desired - Call for worsening or other concerns   Return  in about 1 year (around 10/31/2025) for UBSE, Hx of Dysplastic nevi, Hx of AKs.  I, Grayce Saunas, RMA, am acting as scribe for Rexene Rattler, MD .   Documentation: I have reviewed the above documentation for accuracy and completeness, and I agree with the above.  Rexene Rattler, MD

## 2024-11-01 ENCOUNTER — Ambulatory Visit: Payer: Self-pay | Admitting: Dermatology

## 2024-11-01 LAB — SURGICAL PATHOLOGY

## 2024-11-02 ENCOUNTER — Other Ambulatory Visit: Attending: Cardiology

## 2024-11-02 ENCOUNTER — Encounter: Payer: Self-pay | Admitting: Dermatology

## 2024-11-02 DIAGNOSIS — I4821 Permanent atrial fibrillation: Secondary | ICD-10-CM

## 2024-11-02 DIAGNOSIS — I493 Ventricular premature depolarization: Secondary | ICD-10-CM | POA: Diagnosis not present

## 2024-11-02 DIAGNOSIS — I5022 Chronic systolic (congestive) heart failure: Secondary | ICD-10-CM

## 2024-11-02 DIAGNOSIS — Z950 Presence of cardiac pacemaker: Secondary | ICD-10-CM

## 2024-11-02 LAB — ECHOCARDIOGRAM COMPLETE
Area-P 1/2: 2.87 cm2
S' Lateral: 4.26 cm

## 2024-11-02 NOTE — Telephone Encounter (Signed)
-----   Message from Rexene Rattler, MD sent at 11/01/2024  6:21 PM EST -----  1. Skin, L upper ear helix :       SQUAMOUS CELL CARCINOMA IN SITU, HYPERTROPHIC, ULCERATED  SCCIS skin cancer, recommend EDC to treat  - please call patient

## 2024-11-02 NOTE — Telephone Encounter (Signed)
 Patient advised of BX result and schedule for EDC. aw

## 2024-11-02 NOTE — Telephone Encounter (Signed)
 Left pt msg to call for bx results/sh

## 2024-11-03 ENCOUNTER — Encounter

## 2024-11-03 ENCOUNTER — Other Ambulatory Visit: Payer: Self-pay | Admitting: Internal Medicine

## 2024-11-03 DIAGNOSIS — R7989 Other specified abnormal findings of blood chemistry: Secondary | ICD-10-CM

## 2024-11-03 LAB — CUP PACEART REMOTE DEVICE CHECK
Battery Remaining Longevity: 3 mo
Battery Voltage: 2.85 V
Brady Statistic RA Percent Paced: 0 %
Brady Statistic RV Percent Paced: 5.8 %
Date Time Interrogation Session: 20251218094546
Implantable Lead Connection Status: 753985
Implantable Lead Connection Status: 753985
Implantable Lead Implant Date: 20160921
Implantable Lead Implant Date: 20160921
Implantable Lead Location: 753859
Implantable Lead Location: 753860
Implantable Lead Model: 5076
Implantable Lead Model: 5076
Implantable Pulse Generator Implant Date: 20160921
Lead Channel Impedance Value: 285 Ohm
Lead Channel Impedance Value: 304 Ohm
Lead Channel Impedance Value: 342 Ohm
Lead Channel Impedance Value: 399 Ohm
Lead Channel Pacing Threshold Amplitude: 0.375 V
Lead Channel Pacing Threshold Amplitude: 1.5 V
Lead Channel Pacing Threshold Pulse Width: 0.4 ms
Lead Channel Pacing Threshold Pulse Width: 0.4 ms
Lead Channel Sensing Intrinsic Amplitude: 1.375 mV
Lead Channel Sensing Intrinsic Amplitude: 1.375 mV
Lead Channel Sensing Intrinsic Amplitude: 6 mV
Lead Channel Sensing Intrinsic Amplitude: 6 mV
Lead Channel Setting Pacing Amplitude: 3.25 V
Lead Channel Setting Pacing Pulse Width: 0.4 ms
Lead Channel Setting Sensing Sensitivity: 2.8 mV
Zone Setting Status: 755011
Zone Setting Status: 755011

## 2024-11-04 ENCOUNTER — Ambulatory Visit: Payer: Self-pay | Admitting: Cardiology

## 2024-11-07 NOTE — Addendum Note (Signed)
 Addended by: CHAUVIGNE, Mohammedali Bedoy on: 11/07/2024 03:12 PM   Modules accepted: Orders

## 2024-11-11 ENCOUNTER — Ambulatory Visit
Admission: RE | Admit: 2024-11-11 | Discharge: 2024-11-11 | Disposition: A | Discharge status: Home / Self Care | Source: Ambulatory Visit | Attending: Internal Medicine | Admitting: Internal Medicine

## 2024-11-11 DIAGNOSIS — R7989 Other specified abnormal findings of blood chemistry: Secondary | ICD-10-CM | POA: Insufficient documentation

## 2024-11-16 ENCOUNTER — Encounter: Payer: Self-pay | Admitting: Internal Medicine

## 2024-11-16 ENCOUNTER — Ambulatory Visit: Admitting: Internal Medicine

## 2024-11-16 VITALS — BP 116/68 | HR 66 | Ht 75.0 in | Wt 198.2 lb

## 2024-11-16 DIAGNOSIS — I495 Sick sinus syndrome: Secondary | ICD-10-CM

## 2024-11-16 DIAGNOSIS — I340 Nonrheumatic mitral (valve) insufficiency: Secondary | ICD-10-CM

## 2024-11-16 DIAGNOSIS — I493 Ventricular premature depolarization: Secondary | ICD-10-CM | POA: Diagnosis not present

## 2024-11-16 DIAGNOSIS — I5022 Chronic systolic (congestive) heart failure: Secondary | ICD-10-CM | POA: Diagnosis not present

## 2024-11-16 DIAGNOSIS — I4821 Permanent atrial fibrillation: Secondary | ICD-10-CM

## 2024-11-16 DIAGNOSIS — I428 Other cardiomyopathies: Secondary | ICD-10-CM | POA: Diagnosis not present

## 2024-11-16 DIAGNOSIS — I10 Essential (primary) hypertension: Secondary | ICD-10-CM

## 2024-11-16 NOTE — Patient Instructions (Signed)
 Medication Instructions:  No changes *If you need a refill on your cardiac medications before your next appointment, please call your pharmacy*  Lab Work: None ordered If you have labs (blood work) drawn today and your tests are completely normal, you will receive your results only by: MyChart Message (if you have MyChart) OR A paper copy in the mail If you have any lab test that is abnormal or we need to change your treatment, we will call you to review the results.  Testing/Procedures: None ordered  Follow-Up: At [facility], you and your health needs are our priority.  As part of our continuing mission to provide you with exceptional heart care, our providers are all part of one team.  This team includes your primary Cardiologist (physician) and Advanced Practice Providers or APPs (Physician Assistants and Nurse Practitioners) who all work together to provide you with the care you need, when you need it.  Your next appointment:   3 month(s)  Provider:   You may see Dr. Mady or one of the following Advanced Practice Providers on your designated Care Team:   Lonni Meager, NP Lesley Maffucci, PA-C Bernardino Bring, PA-C Cadence Humboldt, PA-C Tylene Lunch, NP Barnie Hila, NP    We recommend signing up for the patient portal called MyChart.  Sign up information is provided on this After Visit Summary.  MyChart is used to connect with patients for Virtual Visits (Telemedicine).  Patients are able to view lab/test results, encounter notes, upcoming appointments, etc.  Non-urgent messages can be sent to your provider as well.   To learn more about what you can do with MyChart, go to [email or web address].   Other Instructions A referral has been placed to the [facility]

## 2024-11-16 NOTE — Progress Notes (Unsigned)
" °  Cardiology Office Note:  .   Date:  11/16/2024  ID:  [name] Grant Moreno., DOB 1941-02-21, MRN [id number] PCP: Lenon Layman ORN, MD  [facility] Providers Cardiologist:  New  Electrophysiologist:  Fonda Kitty, MD { Click to update primary MD,subspecialty MD or APP then REFRESH:1}    History of Present Illness: .   Grant Moreno. is a 83 y.o. male with chronic HFrEF, permanent atrial fibrillation, sinus node dysfunction status post pacemaker, frequent PVCs, hypertension, hyperlipidemia, and type 2 diabetes mellitus, who has been referred by Dr. Cindie to establish general cardiology care.  At his last visit with Dr. Cindie in September, Mr. Fennell reported fatigue with exertion that improved with addition of twice daily amiodarone  to suppress his PVCs.  However, symptoms had returned after amiodarone  was reduced to once daily dosing.  He was advised to continue his current dose of amiodarone  with plans for repeat echocardiogram in 3 months.  This showed persistently depressed LVEF of 25-30% with global hypokinesis severe biatrial enlargement, moderate to severe MR, moderate to severe TR, and mild-moderate AI.  ROS: See HPI  Studies Reviewed: SABRA        TTE (11/02/2024): Normal LV size and wall thickness.  LVEF 25-30% with global hypokinesis.  Indeterminate diastolic parameters.  GLS -12%.  Normal RV size and function.  Upper normal PA pressure (RVSP 35 mmHg).  Severe biatrial enlargement.  Moderate to severe MR and TR as well as mild-moderate AI.  Normal CVP.  No pericardial effusion.  LHC (05/27/2024): Minimal luminal irregularities without evidence of obstructive CAD.  Normal LVEDP.  Risk Assessment/Calculations:   {Does this patient have ATRIAL FIBRILLATION?[phone]}         Physical Exam:   VS:  BP 116/68 (BP Location: Right Arm, Cuff Size: Normal)   Pulse 66   Ht 6' 3 (1.905 m)   Wt 198 lb 4 oz (89.9 kg)   SpO2 98%   BMI 24.78 kg/m    Wt Readings from Last 3  Encounters:  11/16/24 198 lb 4 oz (89.9 kg)  08/03/24 192 lb 8 oz (87.3 kg)  05/27/24 196 lb (88.9 kg)    General:  NAD. Neck: No JVD or HJR. Lungs: Clear to auscultation bilaterally without wheezes or crackles. Heart: Regular rate and rhythm without murmurs, rubs, or gallops. Abdomen: Soft, nontender, nondistended. Extremities: No lower extremity edema.  ASSESSMENT AND PLAN: .    ***    {Are you ordering a CV Procedure (e.g. stress test, cath, DCCV, TEE, etc)?   Press F2        :[id number]}  Dispo: ***  Signed, Lonni Hanson, MD  "

## 2024-11-17 ENCOUNTER — Encounter: Payer: Self-pay | Admitting: Internal Medicine

## 2024-11-17 DIAGNOSIS — I495 Sick sinus syndrome: Secondary | ICD-10-CM | POA: Insufficient documentation

## 2024-11-17 DIAGNOSIS — I340 Nonrheumatic mitral (valve) insufficiency: Secondary | ICD-10-CM | POA: Insufficient documentation

## 2024-11-28 NOTE — Progress Notes (Signed)
 " Electrophysiology Office Note:   Date:  12/02/2024  ID:  [name] [name], DOB September 25, 1941, MRN [id number]  Primary Cardiologist: Lonni Hanson, MD Electrophysiologist: Fonda Kitty, MD      History of Present Illness:   Azir Muzyka. is a 84 y.o. male with h/o chronic HFrEF, permanent atrial fibrillation, sinus node dysfunction status post pacemaker, frequent PVCs, hypertension, hyperlipidemia, and type 2 diabetes mellitus who is being seen today for EP follow up.  Discussed the use of AI scribe software for clinical note transcription with the patient, who gave verbal consent to proceed.  History of Present Illness Arvin Abello. is an 84 year old male with a pacemaker who presents for follow-up regarding an upcoming battery change. He is accompanied by his wife, Elijah.  He is nearing the time for a battery change on his pacemaker and has been experiencing extra heartbeats. A medication was previously prescribed to suppress these, and an echocardiogram was repeated. He feels well and does not notice any symptoms related to his pacemaker or heart condition.  He has a history of atrial fibrillation and is in AFib continuously. His pacemaker is used about 10% of the time, primarily when his heart rate slows down. He is currently on amiodarone , which he tolerates well without significant side effects.  He has been under the care of multiple cardiologists over the years, including Dr. Fernande, Dr. Cindie, and Dr. Hanson, before seeing the current provider. He recalls the initial pacemaker placement was done nearly ten years ago by Dr. Fernande.   Review of systems complete and found to be negative unless listed in HPI.   EP Information / Studies Reviewed:    EKG is not ordered today. EKG from 11/16/24 reviewed which showed AF with QRS .       Echo 11/02/24:   1. Left ventricular ejection fraction, by estimation, is 25 to 30%. The  left ventricle has severely decreased function. The left  ventricle  demonstrates global hypokinesis. Left ventricular diastolic parameters are  indeterminate. The average left  ventricular global longitudinal strain is -12.0 %. The global longitudinal  strain is abnormal.   2. Right ventricular systolic function is normal. The right ventricular  size is normal. Tricuspid regurgitation signal is inadequate for assessing  PA pressure. The estimated right ventricular systolic pressure is 34.6  mmHg.   3. Left atrial size was severely dilated.   4. Right atrial size was severely dilated.   5. The mitral valve is normal in structure. Moderate to severe mitral  valve regurgitation. No evidence of mitral stenosis.   6. Tricuspid valve regurgitation is moderate to severe.   7. The aortic valve is tricuspid. Aortic valve regurgitation is mild to  moderate. No aortic stenosis is present.   8. The inferior vena cava is normal in size with greater than 50%  respiratory variability, suggesting right atrial pressure of 3 mmHg.   LHC 05/27/24:  1.  Minimal irregularities with no evidence of obstructive coronary artery disease. 2.  Left ventricular angiography was not performed.  EF was moderately to severely reduced by echo. 3.  Normal left ventricular end-diastolic pressure.  Cardiac PET 05/19/24:    LV perfusion is abnormal. There is evidence of ischemia. Defect 1: There is a medium defect with moderate reduction in uptake present in the mid to basal anterior and anterolateral location(s) that is reversible. Consistent with ischemia. Defect 2: There is a small defect with mild reduction in uptake present in  the apical lateral and apex location(s) that is reversible. Suggestive of ischemia but cannot rule out artifact.   Rest left ventricular function is abnormal. Rest global function is severely reduced. Rest EF: 33%. Stress left ventricular function is abnormal. Stress global function is severely reduced. Stress EF: 28%. End diastolic cavity size is mildly  enlarged.  LVEF may be underestimated due to gating difficulty related to atrial fibrillation and frequent PVC's versus aberrancy, though LVEF was 25-30% by echo in 07/2022.   Myocardial blood flow was computed to be 0.67ml/g/min at rest and 2.64ml/g/min at stress. Global myocardial blood flow reserve was 2.75 and was normal.   Findings are consistent with ischemia. The study is high risk, given multiple areas of ischemia severely reduced LVEF (which decreases further with stress), and mild transient ischemic dilation.    Physical Exam:   VS:  BP 128/68 (BP Location: Left Arm, Patient Position: Sitting)   Pulse (!) 57   Ht 6' 3 (1.905 m)   Wt 195 lb 12.8 oz (88.8 kg)   SpO2 99%   BMI 24.47 kg/m    Wt Readings from Last 3 Encounters:  11/29/24 195 lb 12.8 oz (88.8 kg)  11/16/24 198 lb 4 oz (89.9 kg)  08/03/24 192 lb 8 oz (87.3 kg)     General: Well developed, in no acute distress.  Neck: No JVD.  Cardiac: Normal rate, regular rhythm.  Holosystolic murmur.  Well-healed left chest pacer pocket. Resp: Normal work of breathing.  Ext: No edema.  Neuro: No gross focal deficits.  Psych: Normal affect.   ASSESSMENT AND PLAN:    #Chronic systolic heart failure: NYHA class II.  LVEF 25 to 30%.  Appears well compensated on exam today. 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  - Patient has 3 months to ERI.  Given that he has persistent LV dysfunction and severe MR with left bundle branch like IVCD pattern on ECG with QRS greater than 130 ms, I think would be reasonable to pursue upgrade to CRT-P at time of generator change.  Given his age, I do not think upgrade to defibrillator is indicated. - Continue GDMT and follow-up with general cardiology.  #Frequent PVCs #High risk medication use - He is on amiodarone  for PVC suppression given his low LVEF.  Despite this, LVEF has not improved.  I do, however, believe that PVC suppression will be important if BiV upgrade  is pursued.  - Very minor bump in LFTs since starting amiodarone .  We will repeat CMP, TSH when labs are drawn for generator replacement.  #S/p dual chamber pacemaker:  - In-clinic device interrogation was performed today.  Appropriate device function stable lead parameters.  Estimated 3 months to ERI. - See above for upgrade to CRT-P at time of generator change.  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  Continue torsemide  as needed.  Follow-up with cardiology regularly.  Follow up with Dr. Kennyth for generator replacement.   Signed, Fonda Kennyth, MD  "

## 2024-11-29 ENCOUNTER — Other Ambulatory Visit: Payer: Self-pay

## 2024-11-29 ENCOUNTER — Encounter: Payer: Self-pay | Admitting: Cardiology

## 2024-11-29 ENCOUNTER — Ambulatory Visit: Attending: Cardiology | Admitting: Cardiology

## 2024-11-29 VITALS — BP 128/68 | HR 57 | Ht 75.0 in | Wt 195.8 lb

## 2024-11-29 DIAGNOSIS — I361 Nonrheumatic tricuspid (valve) insufficiency: Secondary | ICD-10-CM

## 2024-11-29 DIAGNOSIS — I454 Nonspecific intraventricular block: Secondary | ICD-10-CM | POA: Diagnosis not present

## 2024-11-29 DIAGNOSIS — I428 Other cardiomyopathies: Secondary | ICD-10-CM

## 2024-11-29 DIAGNOSIS — Z950 Presence of cardiac pacemaker: Secondary | ICD-10-CM

## 2024-11-29 DIAGNOSIS — I5022 Chronic systolic (congestive) heart failure: Secondary | ICD-10-CM | POA: Diagnosis not present

## 2024-11-29 DIAGNOSIS — I493 Ventricular premature depolarization: Secondary | ICD-10-CM | POA: Diagnosis not present

## 2024-11-29 DIAGNOSIS — D6869 Other thrombophilia: Secondary | ICD-10-CM

## 2024-11-29 DIAGNOSIS — I340 Nonrheumatic mitral (valve) insufficiency: Secondary | ICD-10-CM | POA: Diagnosis not present

## 2024-11-29 DIAGNOSIS — I4821 Permanent atrial fibrillation: Secondary | ICD-10-CM | POA: Diagnosis not present

## 2024-11-29 LAB — CUP PACEART INCLINIC DEVICE CHECK
Date Time Interrogation Session: 20260113165925
Implantable Lead Connection Status: 753985
Implantable Lead Connection Status: 753985
Implantable Lead Implant Date: 20160921
Implantable Lead Implant Date: 20160921
Implantable Lead Location: 753859
Implantable Lead Location: 753860
Implantable Lead Model: 5076
Implantable Lead Model: 5076
Implantable Pulse Generator Implant Date: 20160921

## 2024-11-29 NOTE — Patient Instructions (Signed)
 Medication Instructions:  Your physician recommends that you continue on your current medications as directed. Please refer to the Current Medication list given to you today.  *If you need a refill on your cardiac medications before your next appointment, please call your pharmacy*  Lab Work: TODAY: CMET, TSH, T4  Testing/Procedures: Pacemaker Generator Change - we will call you when it is time to schedule  Follow-Up: At [facility], you and your health needs are our priority.  As part of our continuing mission to provide you with exceptional heart care, our providers are all part of one team.  This team includes your primary Cardiologist (physician) and Advanced Practice Providers or APPs (Physician Assistants and Nurse Practitioners) who all work together to provide you with the care you need, when you need it.  Your next appointment:

## 2024-11-30 LAB — COMPREHENSIVE METABOLIC PANEL WITH GFR
ALT: 47 IU/L — ABNORMAL HIGH (ref 0–44)
AST: 40 IU/L (ref 0–40)
Albumin: 4.1 g/dL (ref 3.7–4.7)
Alkaline Phosphatase: 86 IU/L (ref 48–129)
BUN/Creatinine Ratio: 20 (ref 10–24)
BUN: 20 mg/dL (ref 8–27)
Bilirubin Total: 0.7 mg/dL (ref 0.0–1.2)
CO2: 22 mmol/L (ref 20–29)
Calcium: 8.7 mg/dL (ref 8.6–10.2)
Chloride: 101 mmol/L (ref 96–106)
Creatinine, Ser: 0.98 mg/dL (ref 0.76–1.27)
Globulin, Total: 2.1 g/dL (ref 1.5–4.5)
Glucose: 92 mg/dL (ref 70–99)
Potassium: 4.7 mmol/L (ref 3.5–5.2)
Sodium: 136 mmol/L (ref 134–144)
Total Protein: 6.2 g/dL (ref 6.0–8.5)
eGFR: 76 mL/min/1.73

## 2024-11-30 LAB — T4, FREE: Free T4: 1.5 ng/dL (ref 0.82–1.77)

## 2024-11-30 LAB — TSH: TSH: 5.1 u[IU]/mL — ABNORMAL HIGH (ref 0.450–4.500)

## 2024-12-02 ENCOUNTER — Ambulatory Visit: Payer: Self-pay | Admitting: Cardiology

## 2024-12-04 ENCOUNTER — Ambulatory Visit: Attending: Cardiology

## 2024-12-05 ENCOUNTER — Encounter

## 2024-12-05 ENCOUNTER — Ambulatory Visit

## 2024-12-05 MED ORDER — AMIODARONE HCL 100 MG PO TABS: 100.0000 mg | ORAL_TABLET | Freq: Every day | ORAL | 3 refills | Status: AC

## 2024-12-06 LAB — CUP PACEART REMOTE DEVICE CHECK
Battery Remaining Longevity: 2 mo
Battery Voltage: 2.84 V
Brady Statistic RA Percent Paced: 0 %
Brady Statistic RV Percent Paced: 11.65 %
Date Time Interrogation Session: 20260118095415
Implantable Lead Connection Status: 753985
Implantable Lead Connection Status: 753985
Implantable Lead Implant Date: 20160921
Implantable Lead Implant Date: 20160921
Implantable Lead Location: 753859
Implantable Lead Location: 753860
Implantable Lead Model: 5076
Implantable Lead Model: 5076
Implantable Pulse Generator Implant Date: 20160921
Lead Channel Impedance Value: 304 Ohm
Lead Channel Impedance Value: 304 Ohm
Lead Channel Impedance Value: 361 Ohm
Lead Channel Impedance Value: 418 Ohm
Lead Channel Pacing Threshold Amplitude: 0.375 V
Lead Channel Pacing Threshold Amplitude: 1.375 V
Lead Channel Pacing Threshold Pulse Width: 0.4 ms
Lead Channel Pacing Threshold Pulse Width: 0.4 ms
Lead Channel Sensing Intrinsic Amplitude: 1.125 mV
Lead Channel Sensing Intrinsic Amplitude: 1.125 mV
Lead Channel Sensing Intrinsic Amplitude: 6.625 mV
Lead Channel Sensing Intrinsic Amplitude: 6.625 mV
Lead Channel Setting Pacing Amplitude: 2.75 V
Lead Channel Setting Pacing Pulse Width: 0.4 ms
Lead Channel Setting Sensing Sensitivity: 2.8 mV
Zone Setting Status: 755011
Zone Setting Status: 755011

## 2024-12-11 ENCOUNTER — Ambulatory Visit: Payer: Self-pay | Admitting: Cardiology

## 2024-12-14 ENCOUNTER — Ambulatory Visit: Admitting: Dermatology

## 2024-12-14 ENCOUNTER — Encounter: Payer: Self-pay | Admitting: Dermatology

## 2024-12-14 DIAGNOSIS — L570 Actinic keratosis: Secondary | ICD-10-CM

## 2024-12-14 DIAGNOSIS — W908XXA Exposure to other nonionizing radiation, initial encounter: Secondary | ICD-10-CM

## 2024-12-14 DIAGNOSIS — D099 Carcinoma in situ, unspecified: Secondary | ICD-10-CM

## 2024-12-14 DIAGNOSIS — D0422 Carcinoma in situ of skin of left ear and external auricular canal: Secondary | ICD-10-CM | POA: Diagnosis not present

## 2024-12-14 DIAGNOSIS — L578 Other skin changes due to chronic exposure to nonionizing radiation: Secondary | ICD-10-CM

## 2024-12-14 NOTE — Patient Instructions (Addendum)
 Instructions for [facility]  One or more of your medications was sent to the [facility] mail order compound[facility]. You will receive an email from them and can purchase the medicine through that link. It will then be mailed to your home at the address you confirmed. If for any reason you do not receive an email from them, please check your spam folder. If you still do not find the email, please let us  know. [facility] phone number is [phone].   -- Start 5-fluorouracil /calcipotriene  cream twice a day for 7 days to affected areas including scalp and then twice a day for 5 days to bilateral temples, cheeks, and twice a day for 7-10 days to arms/tops of hands. Prescription sent to [facility]. Patient advised they will receive an email to purchase the medication online and have it sent to their home. Patient provided with handout reviewing treatment course and side effects and advised to call or message us  on MyChart with any concerns.     Electrodesiccation and Curettage (Scrape and Burn) Wound Care Instructions  Leave the original bandage on for 24 hours if possible.  If the bandage becomes soaked or soiled before that time, it is OK to remove it and examine the wound.  A small amount of post-operative bleeding is normal.  If excessive bleeding occurs, remove the bandage, place gauze over the site and apply continuous pressure (no peeking) over the area for 30 minutes. If this does not work, please call our clinic as soon as possible or page your doctor if it is after hours.   Once a day, cleanse the wound with soap and water. It is fine to shower. If a thick crust develops you may use a Q-tip dipped into dilute hydrogen peroxide (mix 1:1 with water) to dissolve it.  Hydrogen peroxide can slow the healing process, so use it only as needed.    After washing, apply petroleum jelly (Vaseline) or an antibiotic ointment if your doctor prescribed  one for you, followed by a bandage.    For best healing, the wound should be covered with a layer of ointment at all times. If you are not able to keep the area covered with a bandage to hold the ointment in place, this may mean re-applying the ointment several times a day.  Continue this wound care until the wound has healed and is no longer open. It may take several weeks for the wound to heal and close.  Itching and mild discomfort is normal during the healing process.  If you have any discomfort, you can take Tylenol  (acetaminophen ) or ibuprofen as directed on the bottle. (Please do not take these if you have an allergy to them or cannot take them for another reason).  Some redness, tenderness and white or yellow material in the wound is normal healing.  If the area becomes very sore and red, or develops a thick yellow-green material (pus), it may be infected; please notify us .    Wound healing continues for up to one year following surgery. It is not unusual to experience pain in the scar from time to time during the interval.  If the pain becomes severe or the scar thickens, you should notify the office.    A slight amount of redness in a scar is expected for the first six months.  After six months, the redness will fade and the scar will soften and fade.  The color difference becomes less noticeable with time.  If there are any problems, return for a post-op surgery check at your earliest convenience.  To improve the appearance of the scar, you can use silicone scar gel, cream, or sheets (such as Mederma or Serica) every night for up to one year. These are available over the counter (without a prescription).  Please call our office at [phone] for any questions or concerns.  Due to recent changes in healthcare laws, you may see results of your pathology and/or laboratory studies on MyChart before the doctors have had a chance to review them. We understand that in some cases there may be  results that are confusing or concerning to you. Please understand that not all results are received at the same time and often the doctors may need to interpret multiple results in order to provide you with the best plan of care or course of treatment. Therefore, we ask that you please give us  2 business days to thoroughly review all your results before contacting the office for clarification. Should we see a critical lab result, you will be contacted sooner.   If You Need Anything After Your Visit  If you have any questions or concerns for your doctor, please call our main line at [phone] and press option 4 to reach your doctor's medical assistant. If no one answers, please leave a voicemail as directed and we will return your call as soon as possible. Messages left after 4 pm will be answered the following business day.   You may also send us  a message via MyChart. We typically respond to MyChart messages within 1-2 business days.  For prescription refills, please ask your pharmacy to contact our office. Our fax number is [phone].  If you have an urgent issue when the clinic is closed that cannot wait until the next business day, you can page your doctor at the number below.    Please note that while we do our best to be available for urgent issues outside of office hours, we are not available 24/7.   If you have an urgent issue and are unable to reach us , you may choose to seek medical care at your doctor's office, retail clinic, urgent care center, or emergency room.  If you have a medical emergency, please immediately call 911 or go to the emergency department.  Pager Numbers  - Dr. Hester: [phone]  - Dr. Jackquline: [phone]  - Dr. Claudene: [phone]   - Dr. Raymund: [phone]  In the event of inclement weather, please call our main line at [phone] for an update on the status of any delays or closures.  Dermatology Medication Tips: Please keep the boxes  that topical medications come in in order to help keep track of the instructions about where and how to use these. Pharmacies typically print the medication instructions only on the boxes and not directly on the medication tubes.   If your medication is too expensive, please contact our office at [phone] option 4 or send us  a message through MyChart.   We are unable to tell what your co-pay for medications will be in advance as this is different depending on your insurance coverage. However, we may be able to find a substitute medication at lower cost or fill out paperwork to get insurance to cover a needed medication.   If a prior authorization is required to get your medication covered by your insurance company, please allow us  1-2 business days to complete this process.  Drug prices often vary depending on  where the prescription is filled and some pharmacies may offer cheaper prices.  The website www.goodrx.com contains coupons for medications through different pharmacies. The prices here do not account for what the cost may be with help from insurance (it may be cheaper with your insurance), but the website can give you the price if you did not use any insurance.  - You can print the associated coupon and take it with your prescription to the pharmacy.  - You may also stop by our office during regular business hours and pick up a GoodRx coupon card.  - If you need your prescription sent electronically to a different pharmacy, notify our office through [facility] or by phone at [phone] option 4.     Si Usted Necesita Algo Despus de Su Visita  Tambin puede enviarnos un mensaje a travs de Clinical Cytogeneticist. Por lo general respondemos a los mensajes de MyChart en el transcurso de 1 a 2 das hbiles.  Para renovar recetas, por favor pida a su farmacia que se ponga en contacto con nuestra oficina. Randi lakes de fax es [place] [phone].  Si tiene un asunto urgente cuando la  clnica est cerrada y que no puede esperar hasta el siguiente da hbil, puede llamar/localizar a su doctor(a) al nmero que aparece a continuacin.   Por favor, tenga en cuenta que aunque hacemos todo lo posible para estar disponibles para asuntos urgentes fuera del horario de [place], no estamos disponibles las 24 horas del da, los 7 [address]  [address] de la [place].   Si tiene un problema urgente y no puede comunicarse con nosotros, puede optar por buscar atencin mdica  en el consultorio de su doctor(a), en una clnica privada, en un centro de atencin urgente o en una sala de emergencias.  Si tiene engineer, drilling, por favor llame inmediatamente al 911 o vaya a la sala de emergencias.  Nmeros de bper  - Dr. Hester: [phone]  - Dra. Jackquline: [phone]  - Dr. Claudene: [phone]  - Dra. Kitts: [phone]  En caso de inclemencias del [place], por favor llame a nuestra lnea principal al [phone] para una actualizacin sobre el estado de cualquier retraso o cierre.  Consejos para la medicacin en dermatologa: Por favor, guarde las cajas en las que vienen los medicamentos de uso tpico para ayudarle a seguir las instrucciones sobre dnde y cmo usarlos. Las farmacias generalmente imprimen las instrucciones del medicamento slo en las cajas y no directamente en los tubos del [place].   Si su medicamento es muy caro, por favor, pngase en contacto con landry rieger llamando al [phone] y presione la opcin 4 o envenos un mensaje a travs de Clinical Cytogeneticist.   No podemos decirle cul ser su copago por los medicamentos por adelantado ya que esto es diferente dependiendo de la cobertura de su seguro. Sin embargo, es posible que podamos encontrar un medicamento sustituto a audiological scientist un formulario para que el seguro cubra el medicamento que se considera necesario.   Si se requiere una autorizacin previa para que su compaa de seguros cubra su medicamento, por favor  permtanos de 1 a 2 das hbiles para completar este proceso.  Los precios de los medicamentos varan con frecuencia dependiendo del environmental consultant de dnde se surte la receta y alguna farmacias pueden ofrecer precios ms baratos.  El sitio web www.goodrx.com tiene cupones para medicamentos de health and safety inspector. Los precios aqu no tienen en cuenta lo que podra costar con la ayuda del seguro (puede ser ms barato  con su seguro), pero el sitio web puede darle el precio si no visual merchandiser.  - Puede imprimir el cupn correspondiente y llevarlo con su receta a la farmacia.  - Tambin puede pasar por nuestra oficina durante el horario de atencin regular y education officer, museum una tarjeta de cupones de GoodRx.  - Si necesita que su receta se enve electrnicamente a una farmacia diferente, informe a nuestra oficina a travs de MyChart de [place] o por telfono llamando al [phone] y presione la opcin 4.

## 2024-12-14 NOTE — Progress Notes (Signed)
 "  Follow-Up Visit   Subjective  Grant Moreno. is a 84 y.o. 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  Objective  Well appearing patient in no apparent distress; mood and affect are within normal limits.   A focused examination was performed of the following areas: Face, left ear, scalp  Relevant exam findings are noted in the Assessment and Plan.  L upper ear helix Pink bx site  Assessment & Plan   ACTINIC DAMAGE WITH PRECANCEROUS ACTINIC KERATOSES Counseling for Topical Chemotherapy Management: Patient exhibits: - Severe, confluent actinic changes with pre-cancerous actinic keratoses that is secondary to cumulative UV radiation exposure over time - Condition that is severe; chronic, not at goal. - diffuse scaly erythematous macules and papules with underlying dyspigmentation - Discussed Prescription Field Treatment topical Chemotherapy for Severe, Chronic Confluent Actinic Changes with Pre-Cancerous Actinic Keratoses Field treatment involves treatment of an entire area of skin that has confluent Actinic Changes (Sun/ Ultraviolet light damage) and PreCancerous Actinic Keratoses by method of PhotoDynamic Therapy (PDT) and/or prescription Topical Chemotherapy agents such as 5-fluorouracil , 5-fluorouracil /calcipotriene , and/or imiquimod.  The purpose is to decrease the number of clinically evident and subclinical PreCancerous lesions to prevent progression to development of skin cancer by chemically destroying early precancer changes that may or may not be visible.  It has been shown to reduce the risk of developing skin cancer in the treated area. As a result of treatment, redness, scaling, crusting,  and open sores may occur during treatment course. One or more than one of these methods may be used and may have to be used several times to control, suppress and eliminate the PreCancerous changes. Discussed treatment course, expected reaction, and possible side effects. - Recommend daily broad spectrum sunscreen SPF 30+ to sun-exposed areas, reapply every 2 hours as needed.  - Staying in the shade or wearing long sleeves, sun glasses (UVA+UVB protection) and wide brim hats (4-inch brim around the entire circumference of the hat) are also recommended. - Call for new or changing lesions.  -- Start 5-fluorouracil /calcipotriene  cream twice a day for 7 days to affected areas including scalp and then twice a day for 5 days to bilateral temples, cheeks, and twice a day for 7-10 days to arms/tops of hands. Prescription sent to [facility] at last visit. Patient advised they will receive an email to purchase the medication online and have it sent to their home. Patient provided with handout reviewing treatment course and side effects and advised to call or message us  on MyChart with any concerns.    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 - Destruction of lesion  Destruction method: electrodesiccation and curettage   Informed consent: discussed and consent obtained   Timeout:  patient name, date of birth, surgical site, and procedure verified Anesthesia: the lesion was anesthetized in a standard fashion   Anesthetic:  1% lidocaine  w/ epinephrine  1-100,000 buffered w/ 8.4% NaHCO3 Curettage performed in three different directions: Yes   Electrodesiccation performed over the curetted area: Yes   Final wound size (cm):  1.2 Hemostasis achieved with:  pressure, aluminum chloride and electrodesiccation Outcome: patient tolerated procedure well with no complications   Post-procedure details: wound  care instructions given   Additional details:  Post txt defect 1.2 x  0.8cm   Return in 6 months (on 06/13/2025) for recheck SCC IS, AK f/u.  I, Grayce Saunas, RMA, am acting as scribe for Rexene Rattler, MD .   Documentation: I have reviewed the above documentation for accuracy and completeness, and I agree with the above.  Rexene Rattler, MD    "

## 2024-12-29 ENCOUNTER — Encounter

## 2025-01-03 ENCOUNTER — Encounter

## 2025-01-04 ENCOUNTER — Ambulatory Visit

## 2025-01-05 ENCOUNTER — Ambulatory Visit

## 2025-01-05 ENCOUNTER — Encounter

## 2025-02-04 ENCOUNTER — Ambulatory Visit

## 2025-02-05 ENCOUNTER — Ambulatory Visit

## 2025-02-06 ENCOUNTER — Encounter

## 2025-02-08 ENCOUNTER — Ambulatory Visit: Admitting: Internal Medicine

## 2025-03-07 ENCOUNTER — Ambulatory Visit

## 2025-03-08 ENCOUNTER — Ambulatory Visit

## 2025-03-09 ENCOUNTER — Encounter

## 2025-03-30 ENCOUNTER — Encounter

## 2025-04-05 ENCOUNTER — Ambulatory Visit

## 2025-06-13 ENCOUNTER — Ambulatory Visit: Admitting: Dermatology

## 2025-06-29 ENCOUNTER — Encounter

## 2025-11-28 ENCOUNTER — Ambulatory Visit: Admitting: Dermatology
